# Patient Record
Sex: Male | Born: 1970
Health system: Southern US, Community
[De-identification: ages and names within clinical notes are randomized; demographics above are authoritative.]

## PROBLEM LIST (undated history)

## (undated) DIAGNOSIS — Z9289 Personal history of other medical treatment: Secondary | ICD-10-CM

## (undated) DIAGNOSIS — I10 Essential (primary) hypertension: Secondary | ICD-10-CM

## (undated) DIAGNOSIS — M6281 Muscle weakness (generalized): Secondary | ICD-10-CM

## (undated) DIAGNOSIS — I2510 Atherosclerotic heart disease of native coronary artery without angina pectoris: Secondary | ICD-10-CM

## (undated) DIAGNOSIS — H547 Unspecified visual loss: Secondary | ICD-10-CM

## (undated) DIAGNOSIS — E785 Hyperlipidemia, unspecified: Secondary | ICD-10-CM

## (undated) DIAGNOSIS — K760 Fatty (change of) liver, not elsewhere classified: Secondary | ICD-10-CM

## (undated) DIAGNOSIS — E669 Obesity, unspecified: Secondary | ICD-10-CM

## (undated) HISTORY — DX: Obesity, unspecified: E66.9

## (undated) HISTORY — DX: Essential (primary) hypertension: I10

## (undated) HISTORY — DX: Atherosclerotic heart disease of native coronary artery without angina pectoris: I25.10

## (undated) HISTORY — DX: Personal history of other medical treatment: Z92.89

## (undated) HISTORY — DX: Unspecified visual loss: H54.7

## (undated) HISTORY — PX: CARDIAC CATHETERIZATION: SHX172

## (undated) HISTORY — PX: TONSILLECTOMY: SUR1361

## (undated) HISTORY — DX: Muscle weakness (generalized): M62.81

## (undated) HISTORY — PX: WISDOM TOOTH EXTRACTION: SHX21

---

## 1995-11-25 DIAGNOSIS — M6281 Muscle weakness (generalized): Secondary | ICD-10-CM

## 1995-11-25 HISTORY — DX: Muscle weakness (generalized): M62.81

## 2004-11-24 HISTORY — PX: LAPAROSCOPIC GASTRIC BANDING: SHX1100

## 2005-11-24 HISTORY — PX: LAPAROSCOPIC GASTRIC BANDING: SHX1100

## 2012-10-24 DIAGNOSIS — I10 Essential (primary) hypertension: Secondary | ICD-10-CM

## 2012-10-24 HISTORY — DX: Essential (primary) hypertension: I10

## 2012-10-25 ENCOUNTER — Ambulatory Visit (INDEPENDENT_AMBULATORY_CARE_PROVIDER_SITE_OTHER): Payer: BC Managed Care – PPO | Admitting: Medical

## 2012-10-25 ENCOUNTER — Encounter: Payer: Self-pay | Admitting: Medical

## 2012-10-25 VITALS — BP 160/100 | HR 68 | Temp 98.4°F | Resp 12 | Ht 68.0 in | Wt 213.0 lb

## 2012-10-25 DIAGNOSIS — Z0000 Encounter for general adult medical examination without abnormal findings: Secondary | ICD-10-CM

## 2012-10-25 DIAGNOSIS — M25569 Pain in unspecified knee: Secondary | ICD-10-CM

## 2012-10-25 DIAGNOSIS — R030 Elevated blood-pressure reading, without diagnosis of hypertension: Secondary | ICD-10-CM

## 2012-10-25 DIAGNOSIS — Z23 Encounter for immunization: Secondary | ICD-10-CM

## 2012-10-25 DIAGNOSIS — E669 Obesity, unspecified: Secondary | ICD-10-CM

## 2012-10-25 DIAGNOSIS — M25562 Pain in left knee: Secondary | ICD-10-CM

## 2012-10-25 LAB — POCT URINALYSIS DIPSTICK
Blood, UA: NEGATIVE
Glucose, UA: NEGATIVE
Nitrite, UA: NEGATIVE
Urobilinogen, UA: NEGATIVE

## 2012-10-25 LAB — CBC WITH DIFFERENTIAL/PLATELET
Basophils Relative: 0 % (ref 0–1)
Eosinophils Absolute: 0.1 10*3/uL (ref 0.0–0.7)
Eosinophils Relative: 1 % (ref 0–5)
Hemoglobin: 14.3 g/dL (ref 13.0–17.0)
MCH: 28.5 pg (ref 26.0–34.0)
MCHC: 34.6 g/dL (ref 30.0–36.0)
Monocytes Absolute: 0.7 10*3/uL (ref 0.1–1.0)
Monocytes Relative: 7 % (ref 3–12)
Neutrophils Relative %: 60 % (ref 43–77)

## 2012-10-25 MED ORDER — LISINOPRIL 10 MG PO TABS: 10.0000 mg | ORAL_TABLET | Freq: Every day | ORAL | Status: DC

## 2012-10-25 NOTE — Progress Notes (Signed)
Subjective:   HPI  Hunter Gomez is a 41 y.o. male who presents for a complete physical.  New patient today.  No recent primary care.   Preventative care: Last ophthalmology visit:N/A Last dental visit:2 YEARS AGO Last colonoscopy:N/A Last prostate exam: N/A Last EKG:N/A Last labs:2008  Prior vaccinations: TD or Tdap:N/A Influenza:NO, declines Pneumococcal:N/A Shingles/Zostavax:N/A  Concerns: Occasional sinus problems winter and spring.  Uses Afrin sometimes OTC.  No current symptoms.  Recent left knee pains intermittent, worse with running, but no problems with elliptical.  Has increase pain going down stairs.    BP has been elevated at times.  Parents and twin brother have HTN.  He reports nurse recently checked his BP some, and he has been seeing 150/90s.  Reviewed their medical, surgical, family, social, medication, and allergy history and updated chart as appropriate.   Past Medical History  Diagnosis Date  . Vision problem     prior use of glasses, not currently  . Muscle weakness 1997    prior seizure like activity, but none since; prior neurology eval, prior normal EEG 1997  . Twin birth     has identical twin  . Hypertension 10/2012    Past Surgical History  Procedure Date  . Tonsillectomy   . Wisdom tooth extraction   . Laparoscopic gastric banding 2007    initial procedure Dr. Robb Matar in [place], adjustments in [place], [place]    Family History  Problem Relation Age of Onset  . Diabetes Mother   . Hypertension Mother   . Hyperlipidemia Mother   . Heart disease Mother 17    CABG  . Peripheral vascular disease Mother   . Hypertension Father   . Hyperlipidemia Father   . Heart disease Father 28    CABG  . Stroke Father   . Hypertension Brother   . Hyperlipidemia Brother   . Cancer Maternal Grandfather     stomach  . Cancer Paternal Grandmother     History   Social History  . Marital Status: Married    Spouse Name: N/A    Number of  Children: N/A  . Years of Education: N/A   Occupational History  . Not on file.   Social History Main Topics  . Smoking status: Never Smoker   . Smokeless tobacco: Not on file  . Alcohol Use: No  . Drug Use: No  . Sexually Active: Not on file   Other Topics Concern  . Not on file   Social History Narrative   Married, 2 sons, limited exercise, [employer] technician    Current Outpatient Prescriptions on File Prior to Visit  Medication Sig Dispense Refill  . lisinopril (PRINIVIL,ZESTRIL) 10 MG tablet Take 1 tablet (10 mg total) by mouth daily.  30 tablet  3    No Known Allergies   Review of Systems Constitutional: -fever, -chills, -sweats, -unexpected weight change, -decreased appetite, -fatigue Allergy: -sneezing, -itching, -congestion Dermatology: -changing moles, --rash, -lumps ENT: -runny nose, -ear pain, -sore throat, -hoarseness, -sinus pain, -teeth pain, - ringing in ears, -hearing loss, -nosebleeds Cardiology: -chest pain, -palpitations, -swelling, -difficulty breathing when lying flat, -waking up short of breath Respiratory: -cough, -shortness of breath, -difficulty breathing with exercise or exertion, -wheezing, -coughing up blood Gastroenterology: -abdominal pain, -nausea, -vomiting, -diarrhea, -constipation, -blood in stool, -changes in bowel movement, -difficulty swallowing or eating Hematology: -bleeding, -bruising  Musculoskeletal: -joint aches, -muscle aches, -joint swelling, -back pain, -neck pain, -cramping, -changes in gait Ophthalmology: denies vision changes, eye redness, itching,  discharge Urology: -burning with urination, -difficulty urinating, -blood in urine, -urinary frequency, -urgency, -incontinence Neurology: -headache, -weakness, -tingling, -numbness, -memory loss, -falls, -dizziness Psychology: -depressed mood, -agitation, -sleep problems     Objective:   Physical Exam  Reviewed nursing notes and vitals.  General appearance:  alert, no distress, WD/WN, white male Skin:scattered benign appearance macules, left lower chest with small horizontal surgical scar, similar LUQ abdominal region with small horizontal surgical scar from lap band HEENT: normocephalic, conjunctiva/corneas normal, sclerae anicteric, PERRLA, EOMi, nares patent, no discharge or erythema, pharynx normal Oral cavity: MMM, tongue normal, teeth in good repair Neck: supple, no lymphadenopathy, no thyromegaly, no masses, normal ROM, no bruits Chest: non tender, normal shape and expansion Heart: RRR, normal S1, S2, no murmurs Lungs: CTA bilaterally, no wheezes, rhonchi, or rales Abdomen: +bs, soft, non tender, non distended, no masses, no hepatomegaly, no splenomegaly, no bruits Back: non tender, normal ROM, no scoliosis Musculoskeletal: upper extremities non tender, no obvious deformity, normal ROM throughout, lower extremities non tender, no obvious deformity, normal ROM throughout Extremities: no edema, no cyanosis, no clubbing Pulses: 2+ symmetric, upper and lower extremities, normal cap refill Neurological: alert, oriented x 3, CN2-12 intact, strength normal upper extremities and lower extremities, sensation normal throughout, DTRs 2+ throughout, no cerebellar signs, gait normal Psychiatric: normal affect, behavior normal, pleasant  GU: normal male external genitalia, nontender, no masses, no hernia, no lymphadenopathy Rectal: anus normal, prostate WNL   Adult ECG Report  Indication: new diagnosis HTN, physical  Rate: 64 bpm  Rhythm: normal sinus rhythm  QRS Axis: 50 degrees  PR Interval:  QRS Duration: 98ms  QTc:  Conduction Disturbances: none  Other Abnormalities: none , nonspecific T wave inversion V1  Patient's cardiac risk factors are: hypertension, male gender and obesity (BMI >= 30 kg/m2).  EKG comparison: none  Narrative Interpretation: normal EKG   Assessment and Plan :    Encounter Diagnoses  Name Primary?  .  Routine general medical examination at a health care facility Yes  . Elevated blood pressure   . Obesity   . Knee pain, left   . Need for Tdap vaccination      Physical exam - discussed healthy lifestyle, diet, exercise, preventative care, vaccinations, and addressed their concerns.   Advised yearly dentist and ophthalmology f/u.  cautioned on Afrin OTC use.    Elevated BP - based on today's readings, prior recent readings reported, family and twin brother history of HTN, will begin Lisinopril 10mg  daily.   Advised he work on lifestyle changes as well. Recheck 85mo.  Obesity - discussed need for increased in exercise, weight loss, better diet control.  Knee pain - possible patellofemoral syndrome or possible chondromalacia.  Work on home exercises as demonstrated, can try glucosamine or fish oil oral OTC, gradual increase in activity slowly.  F/u 85mo.  Tdap vaccine, VIS and counseling given.   Follow-up pending labs

## 2012-10-26 LAB — COMPREHENSIVE METABOLIC PANEL
AST: 20 U/L (ref 0–37)
Albumin: 4.8 g/dL (ref 3.5–5.2)
Alkaline Phosphatase: 83 U/L (ref 39–117)
BUN: 12 mg/dL (ref 6–23)
Potassium: 4.2 mEq/L (ref 3.5–5.3)
Sodium: 142 mEq/L (ref 135–145)

## 2012-10-26 LAB — TSH: TSH: 3.792 u[IU]/mL (ref 0.350–4.500)

## 2012-10-26 LAB — LIPID PANEL
LDL Cholesterol: 130 mg/dL — ABNORMAL HIGH (ref 0–99)
Total CHOL/HDL Ratio: 4.9 Ratio
VLDL: 31 mg/dL (ref 0–40)

## 2012-10-27 NOTE — Progress Notes (Signed)
Quick Note:  CALLED AND LEFT MESSAGE WORD FOR WORD ON PT MACHINE ALSO FOR HIM TO CALL AND MAKE APT TO FOLLOW UP ON B/P ______

## 2013-03-16 ENCOUNTER — Ambulatory Visit (INDEPENDENT_AMBULATORY_CARE_PROVIDER_SITE_OTHER): Payer: BC Managed Care – PPO | Admitting: Medical

## 2013-03-16 ENCOUNTER — Encounter: Payer: Self-pay | Admitting: Medical

## 2013-03-16 VITALS — BP 160/98 | HR 60 | Temp 98.2°F | Resp 16 | Wt 220.0 lb

## 2013-03-16 DIAGNOSIS — Z9884 Bariatric surgery status: Secondary | ICD-10-CM

## 2013-03-16 DIAGNOSIS — G4710 Hypersomnia, unspecified: Secondary | ICD-10-CM

## 2013-03-16 DIAGNOSIS — I10 Essential (primary) hypertension: Secondary | ICD-10-CM

## 2013-03-16 DIAGNOSIS — R400 Somnolence: Secondary | ICD-10-CM

## 2013-03-16 DIAGNOSIS — R0683 Snoring: Secondary | ICD-10-CM

## 2013-03-16 DIAGNOSIS — E669 Obesity, unspecified: Secondary | ICD-10-CM | POA: Insufficient documentation

## 2013-03-16 DIAGNOSIS — R0989 Other specified symptoms and signs involving the circulatory and respiratory systems: Secondary | ICD-10-CM

## 2013-03-16 MED ORDER — LISINOPRIL-HYDROCHLOROTHIAZIDE 20-25 MG PO TABS: 1.0000 | ORAL_TABLET | Freq: Every day | ORAL | Status: DC

## 2013-03-16 NOTE — Progress Notes (Signed)
Subjective:  Hunter Gomez is a 42 y.o. male who presents for recheck on BP.  Been consistently taking his Lisinopril since last visit but most readings have been 150s/high 80s.  Only once 130s/85.   His brother has HTN (twin brother) takes Benicar HCT 40/25mg .  Father has hx/o HTN and sleep apnea.   He denies snoring.  Wife hasn't mentioned snoring or apnea.  Feels rested when he awakes.  Feels sleepy all the time.  Gets about 8 hours of sleep nightly.  Has lap band surgery.  He is trying to eat healthy but not exercising.  No other aggravating or relieving factors.     No other c/o.  The following portions of the patient's history were reviewed and updated as appropriate: allergies, current medications, past family history, past medical history, past social history, past surgical history and problem list.  ROS Otherwise as in subjective above  Objective: Physical Exam  Vital signs reviewed  General appearance: alert, no distress, WD/WN Oral cavity: MMM, no lesions, no enlarged tonsils Neck: supple, no lymphadenopathy, no thyromegaly, no masses, 17.5" diamter Heart: RRR, normal S1, S2, no murmurs Lungs: CTA bilaterally, no wheezes, rhonchi, or rales Pulses: 2+ radial pulses, 2+ pedal pulses, normal cap refill Ext: no edema   Assessment: Encounter Diagnoses  Name Primary?  . Essential hypertension, benign Yes  . Obesity, unspecified   . Snoring   . Daytime somnolence   . LAP-BAND surgery status      Plan: Stop lisionpril 10mg .  Begin Lisinopril HCT 20/25mg  daily.  Discussed risks/benefits.   Begin exercising, c/t healthy diet, try and lose weight.  We will set him up for in home sleep study.   Follow up: 57mo.

## 2013-07-09 ENCOUNTER — Emergency Department (HOSPITAL_COMMUNITY)
Admission: EM | Admit: 2013-07-09 | Discharge: 2013-07-09 | Disposition: A | Discharge status: Home / Self Care | Payer: BC Managed Care – PPO | Source: Home / Self Care | Attending: Emergency Medicine | Admitting: Emergency Medicine

## 2013-07-09 ENCOUNTER — Encounter (HOSPITAL_COMMUNITY): Payer: Self-pay | Admitting: *Deleted

## 2013-07-09 DIAGNOSIS — T148XXA Other injury of unspecified body region, initial encounter: Secondary | ICD-10-CM

## 2013-07-09 DIAGNOSIS — IMO0002 Reserved for concepts with insufficient information to code with codable children: Secondary | ICD-10-CM

## 2013-07-09 NOTE — ED Notes (Signed)
Patient states he cut his finger this morning and band aid could not stop the bleeding.

## 2013-07-09 NOTE — ED Provider Notes (Signed)
Chief Complaint:   Chief Complaint  Patient presents with  . Extremity Laceration    History of Present Illness:   Hunter Gomez is a 42 year old male lacerated the tip of his right index finger on a sharp pain can lid this afternoon, shortly before coming here. His last tetanus vaccine was last December. He's had trouble controlling the bleeding. Denies numbness or tingling.  Review of Systems:  Other than noted above, the patient denies any of the following symptoms: Systemic:  No fever or chills. Musculoskeletal:  No joint pain or decreased range of motion. Neuro:  No numbness, tingling, or weakness.  PMFSH:  Past medical history, family history, social history, meds, and allergies were reviewed. He has high blood pressure and takes lisinopril/HCTZ for that.  Physical Exam:   Vital signs:  BP 126/84  Pulse 82  Temp(Src) 97.9 F (36.6 C) (Oral)  Resp 16  SpO2 100% Ext:  There is a 1.5 cm laceration on the tip of the right index finger.  All other joints had a full ROM without pain.  Pulses were full.  Good capillary refill in all digits.  No edema. Neurological:  Alert and oriented.  No muscle weakness.  Sensation was intact to light touch.   Procedure: Verbal informed consent was obtained.  The patient was informed of the risks and benefits of the procedure and understands and accepts.  Identity of the patient was verified verbally and by wristband.   The laceration area described above was prepped with Betadine and saline  and anesthetized with a digital block with 5 mL of 2% Xylocaine without epinephrine that had been buffered with 0.5 mL of sodium bicarbonate.  The wound was then closed as follows:  Wound edges were approximated with 4 5-0 nylon sutures.  There were no immediate complications, and the patient tolerated the procedure well. The laceration was then cleansed, Bacitracin ointment was applied and a clean, dry pressure dressing was put on.   Assessment:  The encounter  diagnosis was Laceration.  Plan:   1.  The following meds were prescribed:   Discharge Medication List as of 07/09/2013  1:50 PM     2.  The patient was instructed in wound care and pain control, and handouts were given. 3.  The patient was told to return in 14 days for suture removal or wound recheck or sooner if any sign of infection.     Reuben Likes, MD 07/09/13 2031

## 2013-08-01 ENCOUNTER — Ambulatory Visit (INDEPENDENT_AMBULATORY_CARE_PROVIDER_SITE_OTHER): Payer: BC Managed Care – PPO | Admitting: Medical

## 2013-08-01 ENCOUNTER — Telehealth: Payer: Self-pay | Admitting: Internal Medicine

## 2013-08-01 ENCOUNTER — Encounter: Payer: Self-pay | Admitting: Medical

## 2013-08-01 VITALS — BP 122/80 | HR 60 | Temp 97.9°F | Resp 16 | Wt 224.0 lb

## 2013-08-01 DIAGNOSIS — R209 Unspecified disturbances of skin sensation: Secondary | ICD-10-CM

## 2013-08-01 DIAGNOSIS — S6991XD Unspecified injury of right wrist, hand and finger(s), subsequent encounter: Secondary | ICD-10-CM

## 2013-08-01 DIAGNOSIS — I10 Essential (primary) hypertension: Secondary | ICD-10-CM

## 2013-08-01 DIAGNOSIS — Z5189 Encounter for other specified aftercare: Secondary | ICD-10-CM

## 2013-08-01 DIAGNOSIS — R202 Paresthesia of skin: Secondary | ICD-10-CM

## 2013-08-01 MED ORDER — LISINOPRIL-HYDROCHLOROTHIAZIDE 20-25 MG PO TABS: 1.0000 | ORAL_TABLET | Freq: Every day | ORAL | Status: DC

## 2013-08-01 NOTE — Progress Notes (Signed)
Subjective: Here for follow-up of hypertension. He is exercising some, and heis adherent to a low-salt diet.  Blood pressure is well controlled at home.  Cardiac symptoms: none. Patient denies: chest pain, chest pressure/discomfort, lower extremity edema and palpitations. Cardiovascular risk factors: hypertension, male gender and obesity (BMI >= 30 kg/m2). Use of agents associated with hypertension: none. History of target organ damage: none.  He was seen in the ED 3 wk ago for laceration to tip of right index finger.  Was suppose to have sutures out 2 wk ago, but took them out on his own but cutting and pulling at the knot this weekend as the skin was starting to grow into the sutures.  He denies redness, drainage, warmth.  He notes the last few weeks been having some tingling and somewhat of a numbness in the left hand at times.  This sometimes includes the forearm.  Denies neck pain, jolting pain down arm from shoulder or neck.  No swelling.  Denies recent injury, trauma.  He is on the computer often at work.    Objective:   Physical Exam  Filed Vitals:   08/01/13 1556  BP: 122/80  Pulse: 60  Temp: 97.9 F (36.6 C)  Resp: 16    General appearance: alert, no distress, WD/WN Neck: supple, nontender, normal ROM, no lymphadenopathy, no thyromegaly, no masses Heart: RRR, normal S1, S2, no murmurs Lungs: CTA bilaterally, no wheezes, rhonchi, or rales Musculoskeletal: arms, hands, shoulders and elbows nontender, no swelling, no obvious deformity Extremities: no edema, no cyanosis, no clubbing Pulses: 2+ symmetric, upper and lower extremities, normal cap refill Neurological: UE normal strength, sensation and DTRs, negative phalens and tinels  Assessment and Plan :    Encounter Diagnoses  Name Primary?  . Essential hypertension, benign Yes  . Paresthesia of hand   . Finger injury, right, subsequent encounter    HTN - controlled on current medication.  recommended increased exercise,  c/t healthy diet, salt avoidance. Paresthesia - likely combination of carpal tunnel and ulnar nerve syndrome, mild.  Begin OTC night time reinforced wrist splint on the left, consider ergonomic keyboard for computer at work,NSAID prn. Finger pain - no obvious signs of infection, healing fine.   Advised not to wait so long next time to have sutures removed. Follow-up prn, within 6-9 mo on hypertension.

## 2013-08-01 NOTE — Telephone Encounter (Signed)
Med was printed instead of sent in

## 2013-09-29 ENCOUNTER — Other Ambulatory Visit: Payer: Self-pay

## 2013-10-11 DIAGNOSIS — Z0279 Encounter for issue of other medical certificate: Secondary | ICD-10-CM

## 2013-10-12 ENCOUNTER — Telehealth: Payer: Self-pay | Admitting: Internal Medicine

## 2013-10-12 NOTE — Telephone Encounter (Signed)
Faxed over medical records to [facility] @ [phone] on 10/11/13. 

## 2013-12-22 ENCOUNTER — Ambulatory Visit (INDEPENDENT_AMBULATORY_CARE_PROVIDER_SITE_OTHER): Payer: BC Managed Care – PPO | Admitting: Family Medicine

## 2013-12-22 VITALS — BP 132/90 | HR 113 | Temp 101.1°F | Wt 206.0 lb

## 2013-12-22 DIAGNOSIS — J111 Influenza due to unidentified influenza virus with other respiratory manifestations: Secondary | ICD-10-CM

## 2013-12-22 LAB — POCT INFLUENZA A/B

## 2013-12-22 NOTE — Patient Instructions (Addendum)
Take 2 Aleve 2 or 3 times a day for the fever aches and pains. It is reasonable stay home until fever goes awayInfluenza A (H1N1) H1N1 formerly called "swine flu" is a new influenza virus causing sickness in people. The H1N1 virus is different from seasonal influenza viruses. However, the H1N1 symptoms are similar to seasonal influenza and it is spread from person to person. You may be at higher risk for serious problems if you have underlying serious medical conditions. The CDC and the [employer] Health Organization are following reported cases around the world. CAUSES   The flu is thought to spread mainly person-to-person through coughing or sneezing of infected people.  A person may become infected by touching something with the virus on it and then touching their mouth or nose. SYMPTOMS   Fever.  Headache.  Tiredness.  Cough.  Sore throat.  Runny or stuffy nose.  Body aches.  Diarrhea and vomiting These symptoms are referred to as "flu-like symptoms." A lot of different illnesses, including the common cold, may have similar symptoms. DIAGNOSIS   There are tests that can tell if you have the H1N1 virus.  Confirmed cases of H1N1 will be reported to the state or local health department.  A doctor's exam may be needed to tell whether you have an infection that is a complication of the flu. HOME CARE INSTRUCTIONS   Stay informed. Visit the [facility] website for current recommendations. Visit [employer].tnwww.cdc.gov/H1N1flu/. You may also call 1-800-CDC-INFO (229-166-21881-608-328-4273).  Get help early if you develop any of the above symptoms.  If you are at high risk from complications of the flu, talk to your caregiver as soon as you develop flu-like symptoms. Those at higher risk for complications include:  People 65 years or older.  People with chronic medical conditions.  Pregnant women.  Young children.  Your caregiver may recommend antiviral medicine to help treat the flu.  If you get the flu,  get plenty of rest, drink enough water and fluids to keep your urine clear or pale yellow, and avoid using alcohol or tobacco.  You may take over-the-counter medicine to relieve the symptoms of the flu if your caregiver approves. (Never give aspirin to children or teenagers who have flu-like symptoms, particularly fever). TREATMENT  If you do get sick, antiviral drugs are available. These drugs can make your illness milder and make you feel better faster. Treatment should start soon after illness starts. It is only effective if taken within the first day of becoming ill. Only your caregiver can prescribe antiviral medication.  PREVENTION   Cover your nose and mouth with a tissue or your arm when you cough or sneeze. Throw the tissue away.  Wash your hands often with soap and warm water, especially after you cough or sneeze. Alcohol-based cleaners are also effective against germs.  Avoid touching your eyes, nose or mouth. This is one way germs spread.  Try to avoid contact with sick people. Follow public health advice regarding school closures. Avoid crowds.  Stay home if you get sick. Limit contact with others to keep from infecting them. People infected with the H1N1 virus may be able to infect others anywhere from 1 day before feeling sick to 5-7 days after getting flu symptoms.  An H1N1 vaccine is available to help protect against the virus. In addition to the H1N1 vaccine, you will need to be vaccinated for seasonal influenza. The H1N1 and seasonal vaccines may be given on the same day. The CDC especially recommends  the H1N1 vaccine for:  Pregnant women.  People who live with or care for children younger than 18 months of age.  Health care and emergency services personnel.  Persons between the ages of 11 months through 41 years of age.  People from ages 32 through 57 years who are at higher risk for H1N1 because of chronic health disorders or immune system problems. FACEMASKS In  community and home settings, the use of facemasks and N95 respirators are not normally recommended. In certain circumstances, a facemask or N95 respirator may be used for persons at increased risk of severe illness from influenza. Your caregiver can give additional recommendations for facemask use. IN CHILDREN, EMERGENCY WARNING SIGNS THAT NEED URGENT MEDICAL CARE:  Fast breathing or trouble breathing.  Bluish skin color.  Not drinking enough fluids.  Not waking up or not interacting normally.  Being so fussy that the child does not want to be held.  Your child has an oral temperature above 102 F (38.9 C), not controlled by medicine.  Your baby is older than 3 months with a rectal temperature of 102 F (38.9 C) or higher.  Your baby is 52 months old or younger with a rectal temperature of 100.4 F (38 C) or higher.  Flu-like symptoms improve but then return with fever and worse cough. IN ADULTS, EMERGENCY WARNING SIGNS THAT NEED URGENT MEDICAL CARE:  Difficulty breathing or shortness of breath.  Pain or pressure in the chest or abdomen.  Sudden dizziness.  Confusion.  Severe or persistent vomiting.  Bluish color.  You have a oral temperature above 102 F (38.9 C), not controlled by medicine.  Flu-like symptoms improve but return with fever and worse cough. SEEK IMMEDIATE MEDICAL CARE IF:  You or someone you know is experiencing any of the above symptoms. When you arrive at the emergency center, report that you think you have the flu. You may be asked to wear a mask and/or sit in a secluded area to protect others from getting sick. MAKE SURE YOU:   Understand these instructions.  Will watch your condition.  Will get help right away if you are not doing well or get worse. Some of this information courtesy of the CDC.  Document Released: 04/28/2008 Document Revised: 02/02/2012 Document Reviewed: 04/28/2008 [facility] Patient Information 2014 [place], [place].

## 2013-12-22 NOTE — Progress Notes (Signed)
   Subjective:    Patient ID: Hunter Gomez, male    DOB: 03/07/1971, 43 y.o.   MRN: [id number]  HPI Days ago he had the onset of malaise followed the next day by fever and chills with a temperature of 102 to 104. His son was diagnosed with the flu on Monday of this week. That was positive for influenza B   Review of Systems     Objective:   Physical Exam alert and in no distress. Tympanic membranes and canals are normal. Throat is clear. Tonsils are normal. Neck is supple without adenopathy or thyromegaly. Cardiac exam shows a regular sinus rhythm without murmurs or gallops. Lungs are clear to auscultation. Flu test was negative       Assessment & Plan:  Influenza - Plan: Influenza A/B  although the test was negative I think he certainly qualifies. Discussed the use of Tamiflu and he has not interested. Did recommend NSAID of choice and followup as needed.

## 2014-08-08 ENCOUNTER — Other Ambulatory Visit: Payer: Self-pay | Admitting: Medical

## 2014-08-08 NOTE — Telephone Encounter (Signed)
PATIENT NEEDS AN OFFICE VISIT AS SOON AS POSSIBLE BEFORE HIS MEDICATION RUNS OUT. LAST OFFICE VISIT WAS 07/2013

## 2014-10-23 ENCOUNTER — Other Ambulatory Visit: Payer: Self-pay | Admitting: Medical

## 2014-11-24 DIAGNOSIS — Z951 Presence of aortocoronary bypass graft: Secondary | ICD-10-CM

## 2014-11-24 HISTORY — DX: Presence of aortocoronary bypass graft: Z95.1

## 2014-12-04 ENCOUNTER — Encounter: Payer: Self-pay | Admitting: Medical

## 2014-12-04 ENCOUNTER — Ambulatory Visit (INDEPENDENT_AMBULATORY_CARE_PROVIDER_SITE_OTHER): Payer: BLUE CROSS/BLUE SHIELD | Admitting: Medical

## 2014-12-04 VITALS — BP 140/80 | HR 80 | Temp 98.1°F | Resp 16 | Wt 218.0 lb

## 2014-12-04 DIAGNOSIS — I10 Essential (primary) hypertension: Secondary | ICD-10-CM

## 2014-12-04 DIAGNOSIS — Z9884 Bariatric surgery status: Secondary | ICD-10-CM

## 2014-12-04 DIAGNOSIS — E669 Obesity, unspecified: Secondary | ICD-10-CM

## 2014-12-04 DIAGNOSIS — K2970 Gastritis, unspecified, without bleeding: Secondary | ICD-10-CM

## 2014-12-04 LAB — LIPID PANEL
Cholesterol: 209 mg/dL — ABNORMAL HIGH (ref 0–200)
HDL: 31 mg/dL — AB (ref >39)
LDL Cholesterol: 126 mg/dL — ABNORMAL HIGH (ref 0–99)
Total CHOL/HDL Ratio: 6.7 Ratio
Triglycerides: 262 mg/dL — ABNORMAL HIGH (ref <150)
VLDL: 52 mg/dL — ABNORMAL HIGH (ref 0–40)

## 2014-12-04 LAB — CBC
HEMATOCRIT: 44.2 % (ref 39.0–52.0)
HEMOGLOBIN: 15.1 g/dL (ref 13.0–17.0)
MCH: 28 pg (ref 26.0–34.0)
MCHC: 34.2 g/dL (ref 30.0–36.0)
MCV: 82 fL (ref 78.0–100.0)
MPV: 8.7 fL (ref 8.6–12.4)
Platelets: 450 10*3/uL — ABNORMAL HIGH (ref 150–400)
RBC: 5.39 MIL/uL (ref 4.22–5.81)
RDW: 13.8 % (ref 11.5–15.5)
WBC: 11.4 10*3/uL — ABNORMAL HIGH (ref 4.0–10.5)

## 2014-12-04 LAB — COMPREHENSIVE METABOLIC PANEL
ALBUMIN: 4.6 g/dL (ref 3.5–5.2)
ALK PHOS: 120 U/L — AB (ref 39–117)
ALT: 20 U/L (ref 0–53)
AST: 16 U/L (ref 0–37)
BUN: 10 mg/dL (ref 6–23)
CHLORIDE: 99 meq/L (ref 96–112)
CO2: 26 meq/L (ref 19–32)
Calcium: 9.6 mg/dL (ref 8.4–10.5)
Creat: 0.8 mg/dL (ref 0.50–1.35)
GLUCOSE: 87 mg/dL (ref 70–99)
POTASSIUM: 4.3 meq/L (ref 3.5–5.3)
Sodium: 136 mEq/L (ref 135–145)
TOTAL PROTEIN: 7.4 g/dL (ref 6.0–8.3)
Total Bilirubin: 0.6 mg/dL (ref 0.2–1.2)

## 2014-12-04 LAB — TSH: TSH: 2.366 u[IU]/mL (ref 0.350–4.500)

## 2014-12-04 MED ORDER — LISINOPRIL-HYDROCHLOROTHIAZIDE 20-25 MG PO TABS: 1.0000 | ORAL_TABLET | Freq: Every day | ORAL | Status: DC

## 2014-12-04 MED ORDER — OMEPRAZOLE 40 MG PO CPDR: 40.0000 mg | DELAYED_RELEASE_CAPSULE | Freq: Every day | ORAL | Status: DC

## 2014-12-04 NOTE — Progress Notes (Signed)
Subjective: Here for f/u on hypertension.   Last f/u here for this was over a year ago.  Somehow he got refills that lasted til now.   Ran out of BP medication recently.   Up until he ran out was taking the medication regularly.  BPs was running ok.  Exercise - some.  [place]  Joined a gym.   Having some problems with lap band adjusted.  Can eat reasonable food portions.   Has cough on most nights, no daytime cough.   Can't lie flat of late due aspiration.  Has times where he aspirates in the night lying down.   Planning to see surgeon here soon.    Had the surgery 7 years ago.   In general diet is "normal."  No added salt.   No other aggravating or relieving factors. No other complaint.  Nonsmoker.  No alcohol.    Objective: Filed Vitals:   12/04/14 1024  BP: 140/80  Pulse: 80  Temp: 98.1 F (36.7 C)  Resp: 16   General appearance: alert, no distress, WD/WN Oral cavity: MMM, no lesions Neck: supple, no lymphadenopathy, no thyromegaly, no masses Heart: RRR, normal S1, S2, no murmurs Lungs: CTA bilaterally, no wheezes, rhonchi, or rales Abdomen: +bs, soft, LUQ with hard lump c/w gastric band,otherwise non tender, non distended, no masses, no hepatomegaly, no splenomegaly Pulses: 2+ symmetric, upper and lower extremities, normal cap refill Ext: no edema   Assessment: Encounter Diagnoses  Name Primary?  . Essential hypertension Yes  . History of gastric bypass   . Gastritis   . Obesity      Plan: HTN - continue medication, labs today Hx/o bypass - f/u as planned unless he sees complete resolution of symptoms with omeprazole Gastritis - begin omeprazole daily x 2 wk, reduce diet triggers of GERD.  Likely symptoms are related to diet and lap band, but trial of omeprazole Obesity - work on weight loss efforts F/u pending labs, 2wk on GERD.

## 2014-12-15 ENCOUNTER — Encounter (HOSPITAL_COMMUNITY): Payer: Self-pay | Admitting: Adult Health

## 2014-12-15 ENCOUNTER — Inpatient Hospital Stay (HOSPITAL_COMMUNITY)
Admission: EM | Admit: 2014-12-15 | Discharge: 2014-12-23 | DRG: 234 | Disposition: A | Discharge status: Home / Self Care | Payer: BLUE CROSS/BLUE SHIELD | Attending: Cardiothoracic Surgery | Admitting: Cardiothoracic Surgery

## 2014-12-15 ENCOUNTER — Emergency Department (HOSPITAL_COMMUNITY): Payer: BLUE CROSS/BLUE SHIELD

## 2014-12-15 DIAGNOSIS — R079 Chest pain, unspecified: Secondary | ICD-10-CM

## 2014-12-15 DIAGNOSIS — Z9884 Bariatric surgery status: Secondary | ICD-10-CM | POA: Diagnosis not present

## 2014-12-15 DIAGNOSIS — I2510 Atherosclerotic heart disease of native coronary artery without angina pectoris: Secondary | ICD-10-CM | POA: Diagnosis present

## 2014-12-15 DIAGNOSIS — I214 Non-ST elevation (NSTEMI) myocardial infarction: Secondary | ICD-10-CM | POA: Diagnosis present

## 2014-12-15 DIAGNOSIS — Z79899 Other long term (current) drug therapy: Secondary | ICD-10-CM | POA: Diagnosis not present

## 2014-12-15 DIAGNOSIS — D62 Acute posthemorrhagic anemia: Secondary | ICD-10-CM | POA: Diagnosis not present

## 2014-12-15 DIAGNOSIS — K219 Gastro-esophageal reflux disease without esophagitis: Secondary | ICD-10-CM | POA: Diagnosis present

## 2014-12-15 DIAGNOSIS — I252 Old myocardial infarction: Secondary | ICD-10-CM | POA: Diagnosis present

## 2014-12-15 DIAGNOSIS — J939 Pneumothorax, unspecified: Secondary | ICD-10-CM

## 2014-12-15 DIAGNOSIS — Z8249 Family history of ischemic heart disease and other diseases of the circulatory system: Secondary | ICD-10-CM | POA: Diagnosis not present

## 2014-12-15 DIAGNOSIS — E785 Hyperlipidemia, unspecified: Secondary | ICD-10-CM | POA: Diagnosis present

## 2014-12-15 DIAGNOSIS — R0789 Other chest pain: Secondary | ICD-10-CM

## 2014-12-15 DIAGNOSIS — E669 Obesity, unspecified: Secondary | ICD-10-CM | POA: Diagnosis present

## 2014-12-15 DIAGNOSIS — R000 Tachycardia, unspecified: Secondary | ICD-10-CM | POA: Diagnosis not present

## 2014-12-15 DIAGNOSIS — Z6833 Body mass index (BMI) 33.0-33.9, adult: Secondary | ICD-10-CM

## 2014-12-15 DIAGNOSIS — I10 Essential (primary) hypertension: Secondary | ICD-10-CM | POA: Diagnosis present

## 2014-12-15 DIAGNOSIS — Z951 Presence of aortocoronary bypass graft: Secondary | ICD-10-CM

## 2014-12-15 DIAGNOSIS — E8770 Fluid overload, unspecified: Secondary | ICD-10-CM | POA: Diagnosis not present

## 2014-12-15 HISTORY — DX: Hyperlipidemia, unspecified: E78.5

## 2014-12-15 LAB — BASIC METABOLIC PANEL
Anion gap: 9 (ref 5–15)
BUN: 10 mg/dL (ref 6–23)
CALCIUM: 9.3 mg/dL (ref 8.4–10.5)
CHLORIDE: 102 mmol/L (ref 96–112)
CO2: 27 mmol/L (ref 19–32)
CREATININE: 0.74 mg/dL (ref 0.50–1.35)
GFR calc Af Amer: >90 mL/min (ref >90)
GFR calc non Af Amer: >90 mL/min (ref >90)
Glucose, Bld: 109 mg/dL — ABNORMAL HIGH (ref 70–99)
Potassium: 3.8 mmol/L (ref 3.5–5.1)
SODIUM: 138 mmol/L (ref 135–145)

## 2014-12-15 LAB — TROPONIN I: TROPONIN I: 0.13 ng/mL — AB (ref <0.031)

## 2014-12-15 LAB — I-STAT TROPONIN, ED: Troponin i, poc: 0.07 ng/mL (ref 0.00–0.08)

## 2014-12-15 LAB — CBC
HCT: 43.4 % (ref 39.0–52.0)
Hemoglobin: 14.5 g/dL (ref 13.0–17.0)
MCH: 28.4 pg (ref 26.0–34.0)
MCHC: 33.4 g/dL (ref 30.0–36.0)
MCV: 84.9 fL (ref 78.0–100.0)
Platelets: 381 10*3/uL (ref 150–400)
RBC: 5.11 MIL/uL (ref 4.22–5.81)
RDW: 12.8 % (ref 11.5–15.5)
WBC: 11.9 10*3/uL — ABNORMAL HIGH (ref 4.0–10.5)

## 2014-12-15 LAB — HEPATIC FUNCTION PANEL
ALT: 30 U/L (ref 0–53)
AST: 27 U/L (ref 0–37)
Albumin: 4.5 g/dL (ref 3.5–5.2)
Alkaline Phosphatase: 88 U/L (ref 39–117)
Bilirubin, Direct: 0.1 mg/dL (ref 0.0–0.5)
Indirect Bilirubin: 0.3 mg/dL (ref 0.3–0.9)
Total Bilirubin: 0.4 mg/dL (ref 0.3–1.2)
Total Protein: 7.2 g/dL (ref 6.0–8.3)

## 2014-12-15 MED ORDER — ASPIRIN 325 MG PO TABS
325.0000 mg | ORAL_TABLET | ORAL | Status: AC
  Administered 2014-12-15: 325 mg via ORAL
  Filled 2014-12-15: qty 1

## 2014-12-15 MED ORDER — NITROGLYCERIN 0.4 MG SL SUBL
SUBLINGUAL_TABLET | SUBLINGUAL | Status: AC
  Filled 2014-12-15: qty 3

## 2014-12-15 MED ORDER — NITROGLYCERIN 0.3 MG SL SUBL
0.3000 mg | SUBLINGUAL_TABLET | Freq: Once | SUBLINGUAL | Status: DC
  Filled 2014-12-15: qty 100

## 2014-12-15 MED ORDER — HEPARIN (PORCINE) IN NACL 100-0.45 UNIT/ML-% IJ SOLN
1200.0000 [IU]/h | INTRAMUSCULAR | Status: DC
  Administered 2014-12-15: 1200 [IU]/h via INTRAVENOUS
  Filled 2014-12-15 (×2): qty 250

## 2014-12-15 MED ORDER — HEPARIN BOLUS VIA INFUSION
4000.0000 [IU] | Freq: Once | INTRAVENOUS | Status: AC
  Administered 2014-12-15: 4000 [IU] via INTRAVENOUS
  Filled 2014-12-15: qty 4000

## 2014-12-15 MED ORDER — GI COCKTAIL ~~LOC~~
30.0000 mL | Freq: Once | ORAL | Status: AC
  Administered 2014-12-15: 30 mL via ORAL
  Filled 2014-12-15: qty 30

## 2014-12-15 MED ORDER — NITROGLYCERIN 0.4 MG SL SUBL
0.4000 mg | SUBLINGUAL_TABLET | SUBLINGUAL | Status: DC | PRN
  Administered 2014-12-15 (×2): 0.4 mg via SUBLINGUAL

## 2014-12-15 NOTE — ED Provider Notes (Signed)
CSN: [id number]     Arrival date & time 12/15/14  1839 History   First MD Initiated Contact with Patient 12/15/14 1853     Chief Complaint  Patient presents with  . Chest Pain     (Consider location/radiation/quality/duration/timing/severity/associated sxs/prior Treatment) HPI 44 year old male with past history of hypertension and a prior laparoscopic gastric band which was performed in 2007 presents to ED for achy exertional chest pain which wraps from underneath bilateral arms and into the center of his chest with radiation up to his jaw. Patient states symptoms began last week after he had his lap band fill removed. Patient states immediately following that procedure patient had acute onset of these symptoms while still in office. He received treatment before being discharged that day and states the treatment helped. Since then he reports having acute worsening of this same type of pain while exerting himself at the gym as well as walking up flights of stairs. Patient reports when symptoms start he will have mild shortness of breath. Symptoms will resolve with rest. Patient denies having any fevers, cough, nausea, vomiting, abdominal pain or other symptoms. Prior to this patient reports having issues with aspiration but states since the lap band fill has been removed he has had none and is now even able to lie flat which she was not able to do before this. Patient reports when pain comes on it will get to 10/10. Currently denies any pain or other symptoms. No other complaints at this time. Past Medical History  Diagnosis Date  . Vision problem     prior use of glasses, not currently  . Muscle weakness 1997    prior seizure like activity, but none since; prior neurology eval, prior normal EEG 1997  . Twin birth     has identical twin  . Hypertension 10/2012   Past Surgical History  Procedure Laterality Date  . Tonsillectomy    . Wisdom tooth extraction    . Laparoscopic gastric banding   2007    initial procedure Dr. Robb Matar in [place], adjustments in [place], [place]   Family History  Problem Relation Age of Onset  . Diabetes Mother   . Hypertension Mother   . Hyperlipidemia Mother   . Heart disease Mother 52    CABG  . Peripheral vascular disease Mother   . Hypertension Father   . Hyperlipidemia Father   . Heart disease Father 44    CABG  . Stroke Father   . Hypertension Brother   . Hyperlipidemia Brother   . Cancer Maternal Grandfather     stomach  . Cancer Paternal Grandmother    History  Substance Use Topics  . Smoking status: Never Smoker   . Smokeless tobacco: Not on file  . Alcohol Use: No    Review of Systems  Constitutional: Negative for fever, activity change and appetite change.  HENT: Negative for congestion, rhinorrhea and sore throat.   Eyes: Negative for visual disturbance.  Respiratory: Positive for shortness of breath. Negative for cough.   Cardiovascular: Positive for chest pain. Negative for palpitations and leg swelling.  Gastrointestinal: Negative for nausea, vomiting, abdominal pain and diarrhea.  Genitourinary: Negative for dysuria, flank pain, decreased urine volume and difficulty urinating.  Musculoskeletal: Negative for back pain and neck pain.  Skin: Negative for rash.  Neurological: Negative for dizziness, syncope, speech difficulty, weakness, light-headedness, numbness and headaches.  Psychiatric/Behavioral: Negative for confusion. The patient is nervous/anxious.       Allergies  Review of  patient's allergies indicates no known allergies.  Home Medications   Prior to Admission medications   Medication Sig Start Date End Date Taking? Authorizing Provider  lisinopril-hydrochlorothiazide (PRINZIDE,ZESTORETIC) 20-25 MG per tablet Take 1 tablet by mouth daily. 12/04/14   Kermit Baloavid S Tysinger, PA-C  omeprazole (PRILOSEC) 40 MG capsule Take 1 capsule (40 mg total) by mouth daily. 12/04/14   Kermit Baloavid S Tysinger, PA-C   BP 151/80  mmHg  Pulse 78  Temp(Src) 98.5 F (36.9 C) (Oral)  Resp 18  SpO2 100% Physical Exam  Constitutional: He is oriented to person, place, and time. He appears well-developed and well-nourished. No distress.  HENT:  Head: Normocephalic and atraumatic.  Nose: Nose normal.  Mouth/Throat: Oropharynx is clear and moist. No oropharyngeal exudate.  Eyes: Conjunctivae and EOM are normal.  Neck: Normal range of motion. Neck supple. No JVD present.  Cardiovascular: Normal rate, regular rhythm, normal heart sounds and intact distal pulses.   Pulmonary/Chest: Effort normal and breath sounds normal. No respiratory distress.  Abdominal: Soft. He exhibits no distension and no mass. There is no tenderness. There is no rebound and no guarding.  Musculoskeletal: Normal range of motion.  Neurological: He is alert and oriented to person, place, and time. No cranial nerve deficit.  Skin: Skin is warm and dry. No rash noted.  Psychiatric: He has a normal mood and affect.    ED Course  Procedures (including critical care time) Labs Review Labs Reviewed  CBC - Abnormal; Notable for the following:    WBC 11.9 (*)    All other components within normal limits  BASIC METABOLIC PANEL  HEPATIC FUNCTION PANEL  I-STAT TROPOININ, ED    Imaging Review Dg Chest 2 View  12/15/2014   CLINICAL DATA:  Chest pain for 1 week  EXAM: CHEST  2 VIEW  COMPARISON:  None.  FINDINGS: The heart size and mediastinal contours are within normal limits. Both lungs are clear. The visualized skeletal structures are unremarkable. Tubular structures projected over the left upper quadrant.  IMPRESSION: No active cardiopulmonary disease.   Electronically Signed   By: Sherian ReinWei-Chen  Lin M.D.   On: 12/15/2014 19:14     EKG Interpretation   Date/Time:  Friday December 15 2014 18:46:55 EST Ventricular Rate:  75 PR Interval:  139 QRS Duration: 104 QT Interval:  388 QTC Calculation: 433 R Axis:   63 Text Interpretation:  Sinus rhythm  Confirmed by HARRISON  MD, FORREST  ([id number]) on 12/15/2014 7:22:14 PM      MDM   Final diagnoses:  Chest pain    Hunter Gomez is a 44 y.o. male with H&P as above. Patient is HDS, NAD. -Pertinent historical findings: Presents with atypical chest pain which is exertional. Symptoms began after having lap band fill taken out last week. -Initial impression: Clinical picture likely related to LAP-BAND as symptoms started immediately following procedure last week. Exertional component does raise some concern for cardiac origin. Hear score low. Patient is currently not having any chest pain or other symptoms while in the ED. Screening labs have been sent. EKG shows normal sinus rhythm and NSTWA. Chest x-ray unremarkable  -Results: initial Tn 0.07. -Re-evaluation: no pain, VSS -Plan: delta Tn.  Please See Dr. Romeo AppleHarrison note for remainder of ED care and final disposition.  Pt seen in conjunction with Dr. Purvis SheffieldForrest Harrison, MD  Ames DuraStephen Tyresa Prindiville, DO [facility] Resident - PGY-2     Ames DuraStephen Tailey Top, MD 12/15/14 [id number]  Purvis SheffieldForrest Harrison, MD 12/16/14 1505

## 2014-12-15 NOTE — H&P (Signed)
PCP:   Ernst Breach, PA-C   Chief Complaint:  Chest pain  HPI: 44 year old Caucasian male with past medical history of hypertension, hyperlipidemia, family history of early cardiac vascular disease (both parents with CABG in their early 20s), both were heavy smokers so questionable secondhand smoking exposure who presented with chest pain. 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 Of note patient does have some discomfort in the chest from his GI problems. He has gastroesophageal reflux and is status post lap band in the lower esophagus. He recently had saline removed from the LAP-band on 12/05/2014. Subsequently patient developed chest pressure he first noticed it when he was working on the elliptical and had to stop. That he have more episodes while climbing one flight of stairs at work a few days ago. On the day of presentation patient had 3 episodes of chest pain including one while walking from the parking lot to the emergency department when he needed to stop in order for the chest pain to go away. Patient reported chest pain ranging from 5-10 out of 10 in intensity, radiating to his back and between shoulder blades and both shoulders, not associated with nausea or shortness of breath. Chest pain was relieved by nitroglycerin in the emergency department ( patient required nitroglycerin on 2 occasions ), patient also seemed to be relieved with rest.   patient had an episode of chest pain during my interview while resting in bed.  Patient denied active symptoms of shortness of breath, PND orthopnea, lower extremity edema, frequent or prolonged palpitations, lower extremity edema, nausea vomiting, fever, chills, dysuria.  Review of Systems:  12 systems were reviewed and were found to be  negative except mentioned in history of present illness  Past Medical History: Past Medical History  Diagnosis Date  . Vision problem     prior use of glasses, not currently  . Muscle weakness 1997    prior seizure like activity, but none since; prior neurology eval, prior normal EEG 1997  . Twin birth     has identical twin  . Hypertension 10/2012   hyperlipidemia Past Surgical History  Procedure Laterality Date  . Tonsillectomy    . Wisdom tooth extraction    . Laparoscopic gastric banding  2007    initial procedure Dr. Robb Matar in [place], adjustments in [place], [place]    Medications: Prior to Admission medications   Medication Sig Start Date End Date Taking? Authorizing Provider  acetaminophen (TYLENOL) 160 MG/5ML solution Take 240 mg by mouth every 6 (six) hours as needed for moderate pain.   Yes Historical Provider, MD  dextromethorphan (DELSYM) 30 MG/5ML liquid Take 60 mg by mouth as needed for cough.   Yes Historical Provider, MD  lisinopril-hydrochlorothiazide (PRINZIDE,ZESTORETIC) 20-25 MG per tablet Take 1 tablet by mouth daily. 12/04/14  Yes Kermit Balo Tysinger, PA-C  oxymetazoline (AFRIN) 0.05 % nasal spray Place 1 spray into both nostrils 2 (two) times daily as needed for congestion.   Yes Historical Provider, MD  omeprazole (PRILOSEC) 40 MG capsule Take 1 capsule (40 mg total) by mouth daily. 12/04/14   Jac Canavan, PA-C    Allergies:  No Known Allergies  Social History:  reports that he has never smoked. He does not have any smokeless tobacco history on file. He reports that he does  not drink alcohol or use illicit drugs.   Family History: Family History  Problem Relation Age of Onset  . Diabetes Mother   . Hypertension Mother   . Hyperlipidemia Mother   . Heart disease Mother [id number]    CABG  . Peripheral vascular disease Mother   . Hypertension Father   . Hyperlipidemia Father   . Heart disease Father [id number]    CABG  . Stroke Father   . Hypertension  Brother   . Hyperlipidemia Brother   . Cancer Maternal Grandfather     stomach  . Cancer Paternal Grandmother     PHYSICAL EXAM:  Filed Vitals:   12/15/14 2115 12/15/14 2130 12/15/14 2200 12/15/14 2215  BP: 110/70 112/74 109/72 118/74  Pulse: 65 63 72 61  Temp:      TempSrc:      Resp: 16 15 14 13   SpO2: 99% 100% 100% 100%  General:  Well appearing. No respiratory difficulty HEENT: normal Neck: supple. no JVD. Carotids 2+ bilat; no bruits. No lymphadenopathy or thryomegaly appreciated. Cor: PMI nondisplaced. Regular rate & rhythm. No rubs, gallops or murmurs. Lungs: clear Abdomen: soft, nontender, nondistended. No hepatosplenomegaly. No bruits or masses. Good bowel sounds. Extremities: no cyanosis, clubbing, rash, edema Neuro: alert & oriented x 3, cranial nerves grossly intact. moves all 4 extremities w/o difficulty. Affect pleasant.  Labs on Admission:   Recent Labs  12/15/14 1848  NA 138  K 3.8  CL 102  CO2 27  GLUCOSE 109*  BUN 10  CREATININE 0.74  CALCIUM 9.3    Recent Labs  12/15/14 1848  AST 27  ALT 30  ALKPHOS 88  BILITOT 0.4  PROT 7.2  ALBUMIN 4.5   No results for input(s): LIPASE, AMYLASE in the last 72 hours.  Recent Labs  12/15/14 1848  WBC 11.9*  HGB 14.5  HCT 43.4  MCV 84.9  PLT 381    Recent Labs  12/15/14 2154  TROPONINI 0.13*   Radiological Exams on Admission (all images were personally reviewed and interpreted by me, radiology reports were reviewed as well): Dg Chest 2 View  12/15/2014   CLINICAL DATA:  Chest pain for 1 week  EXAM: CHEST  2 VIEW  COMPARISON:  None.  FINDINGS: The heart size and mediastinal contours are within normal limits. Both lungs are clear. The visualized skeletal structures are unremarkable. Tubular structures projected over the left upper quadrant.  IMPRESSION: No active cardiopulmonary disease.   Electronically Signed   By: Sherian ReinWei-Chen  Lin M.D.   On: 12/15/2014 19:14   EKG personally reviewed and  interpreted by me: NSR 65 bpm, no acute ST-T changes  Assessment/Plan  44 year old patient presented with typical chest pain, mild troponin elevation, risk factors for coronary artery disease including family history, hypertension hyperlipidemia, overweight   Non-ST elevation MI Hypertension Hyperlipidemia  - Admit to stepdown giving recurrent chest pain at rest requiring nitroglycerin  - Heparin drip - Aspirin 81 mg daily, full dose was given in the emergency department - Metoprolol 25 mg twice a day  by mouth starting today - Lipitor 80 mg daily for dose now - Nothing by mouth - Check hemoglobin A1c, repeat EKG and troponin   Leon Montoya 12/15/2014, 11:02 PM

## 2014-12-15 NOTE — Progress Notes (Signed)
ANTICOAGULATION CONSULT NOTE - Initial Consult  Pharmacy Consult for Heparin Indication: chest pain/ACS  No Known Allergies  Patient Measurements: Height: 5' 8.11" (173 cm) IBW/kg (Calculated) : 68.65 Heparin Dosing Weight: 95 kg  Vital Signs: Temp: 98.5 F (36.9 C) (01/22 1849) Temp Source: Oral (01/22 1849) BP: 118/74 mmHg (01/22 2215) Pulse Rate: 61 (01/22 2215)  Labs:  Recent Labs  12/15/14 1848 12/15/14 2154  HGB 14.5  --   HCT 43.4  --   PLT 381  --   CREATININE 0.74  --   TROPONINI  --  0.13*    Estimated Creatinine Clearance: 136.1 mL/min (by C-G formula based on Cr of 0.74).   Medical History: Past Medical History  Diagnosis Date  . Vision problem     prior use of glasses, not currently  . Muscle weakness 1997    prior seizure like activity, but none since; prior neurology eval, prior normal EEG 1997  . Twin birth     has identical twin  . Hypertension 10/2012    Medications:  Zestoretic Prilosec  Assessment: 44 y.o. male with chest pain for heparin   Goal of Therapy:  Heparin level 0.3-0.7 Monitor platelets by anticoagulation protocol: Yes   Plan:  Heparin 4000 units IV bolus, then start heparin 1200 units/hr Check heparin level in 6 hours.   Eddie Candlebbott, Jolisa Intriago Vernon 12/15/2014,11:09 PM

## 2014-12-15 NOTE — ED Notes (Addendum)
Presents with chest pain that starts under both arms and runs through his chest and back. Pain has been intermittent since last Tuesday when he had his LapBand fill taken out, at that time he felt a sudden burst of pain in chest and Dr believed it to be esophageal spasms and gave him some medication that helped. The next day he experienced pain again with exertion and it has continued. Exertion makes chest pain worse, rest makes pain better. Denies SOB, dizziness, nausea.  Denies pain at this time, but has pain with any exertion.

## 2014-12-15 NOTE — ED Notes (Signed)
Patient states he needs to get up and walk around to see if the pain returns.  States it usually comes when he is up.

## 2014-12-16 ENCOUNTER — Inpatient Hospital Stay (HOSPITAL_COMMUNITY): Payer: BLUE CROSS/BLUE SHIELD

## 2014-12-16 ENCOUNTER — Encounter (HOSPITAL_COMMUNITY): Payer: Self-pay | Admitting: *Deleted

## 2014-12-16 DIAGNOSIS — R079 Chest pain, unspecified: Secondary | ICD-10-CM

## 2014-12-16 LAB — HEMOGLOBIN A1C
Hgb A1c MFr Bld: 5.5 % (ref <5.7)
MEAN PLASMA GLUCOSE: 111 mg/dL (ref <117)

## 2014-12-16 LAB — TROPONIN I: Troponin I: 0.11 ng/mL — ABNORMAL HIGH (ref <0.031)

## 2014-12-16 LAB — CBC
HCT: 39.4 % (ref 39.0–52.0)
Hemoglobin: 13.2 g/dL (ref 13.0–17.0)
MCH: 28 pg (ref 26.0–34.0)
MCHC: 33.5 g/dL (ref 30.0–36.0)
MCV: 83.5 fL (ref 78.0–100.0)
Platelets: 364 10*3/uL (ref 150–400)
RBC: 4.72 MIL/uL (ref 4.22–5.81)
RDW: 12.8 % (ref 11.5–15.5)
WBC: 10.5 10*3/uL (ref 4.0–10.5)

## 2014-12-16 LAB — BASIC METABOLIC PANEL
ANION GAP: 8 (ref 5–15)
BUN: 11 mg/dL (ref 6–23)
CO2: 26 mmol/L (ref 19–32)
CREATININE: 0.71 mg/dL (ref 0.50–1.35)
Calcium: 8.9 mg/dL (ref 8.4–10.5)
Chloride: 103 mmol/L (ref 96–112)
GFR calc Af Amer: >90 mL/min (ref >90)
GFR calc non Af Amer: >90 mL/min (ref >90)
GLUCOSE: 114 mg/dL — AB (ref 70–99)
Potassium: 4 mmol/L (ref 3.5–5.1)
SODIUM: 137 mmol/L (ref 135–145)

## 2014-12-16 LAB — HEPARIN LEVEL (UNFRACTIONATED)
Heparin Unfractionated: 0.47 IU/mL (ref 0.30–0.70)
Heparin Unfractionated: 0.1 IU/mL — ABNORMAL LOW (ref 0.30–0.70)

## 2014-12-16 LAB — MRSA PCR SCREENING: MRSA by PCR: NEGATIVE

## 2014-12-16 MED ORDER — HEPARIN (PORCINE) IN NACL 100-0.45 UNIT/ML-% IJ SOLN
1250.0000 [IU]/h | INTRAMUSCULAR | Status: DC
  Administered 2014-12-16: 1200 [IU]/h via INTRAVENOUS
  Administered 2014-12-16: 1400 [IU]/h via INTRAVENOUS
  Administered 2014-12-17: 1250 [IU]/h via INTRAVENOUS
  Filled 2014-12-16 (×5): qty 250

## 2014-12-16 MED ORDER — HEPARIN BOLUS VIA INFUSION
2000.0000 [IU] | Freq: Once | INTRAVENOUS | Status: AC
  Administered 2014-12-16: 2000 [IU] via INTRAVENOUS
  Filled 2014-12-16: qty 2000

## 2014-12-16 MED ORDER — ONDANSETRON HCL 4 MG/2ML IJ SOLN: 4.0000 mg | Freq: Four times a day (QID) | INTRAMUSCULAR | Status: DC | PRN

## 2014-12-16 MED ORDER — METOPROLOL TARTRATE 25 MG PO TABS
25.0000 mg | ORAL_TABLET | Freq: Two times a day (BID) | ORAL | Status: DC
  Administered 2014-12-16 – 2014-12-17 (×5): 25 mg via ORAL
  Filled 2014-12-16 (×9): qty 1

## 2014-12-16 MED ORDER — PANTOPRAZOLE SODIUM 40 MG PO TBEC
40.0000 mg | DELAYED_RELEASE_TABLET | Freq: Every day | ORAL | Status: DC
  Administered 2014-12-16 – 2014-12-18 (×3): 40 mg via ORAL
  Filled 2014-12-16 (×3): qty 1

## 2014-12-16 MED ORDER — ACETAMINOPHEN 325 MG PO TABS: 650.0000 mg | ORAL_TABLET | ORAL | Status: DC | PRN

## 2014-12-16 MED ORDER — NITROGLYCERIN IN D5W 200-5 MCG/ML-% IV SOLN
3.0000 ug/min | INTRAVENOUS | Status: DC
  Administered 2014-12-16: 3 ug/min via INTRAVENOUS
  Filled 2014-12-16: qty 250

## 2014-12-16 MED ORDER — IOHEXOL 350 MG/ML SOLN
80.0000 mL | Freq: Once | INTRAVENOUS | Status: AC | PRN
  Administered 2014-12-16: 80 mL via INTRAVENOUS

## 2014-12-16 MED ORDER — ZOLPIDEM TARTRATE 5 MG PO TABS: 5.0000 mg | ORAL_TABLET | Freq: Every evening | ORAL | Status: DC | PRN

## 2014-12-16 MED ORDER — ASPIRIN EC 81 MG PO TBEC
81.0000 mg | DELAYED_RELEASE_TABLET | Freq: Every day | ORAL | Status: DC
  Administered 2014-12-16 – 2014-12-17 (×2): 81 mg via ORAL
  Filled 2014-12-16 (×3): qty 1

## 2014-12-16 MED ORDER — ATORVASTATIN CALCIUM 80 MG PO TABS
80.0000 mg | ORAL_TABLET | Freq: Every day | ORAL | Status: DC
  Administered 2014-12-16 – 2014-12-22 (×5): 80 mg via ORAL
  Filled 2014-12-16 (×9): qty 1

## 2014-12-16 NOTE — Progress Notes (Signed)
Patient ID: Rinaldo CloudBradley Dockstader, male   DOB: 1971-01-20, 44 y.o.   MRN: [id number]    Subjective:  Denies SSCP, palpitations or Dyspnea   Objective:  Filed Vitals:   12/16/14 0400 12/16/14 0500 12/16/14 0600 12/16/14 0700  BP: 97/68 108/69 102/72 124/87  Pulse: 55 52 57 53  Temp:      TempSrc:      Resp: 11 13 14 13   Height:      Weight:      SpO2: 100% 97% 97% 98%    Intake/Output from previous day:  Intake/Output Summary (Last 24 hours) at 12/16/14 0804 Last data filed at 12/16/14 0700  Gross per 24 hour  Intake  85.75 ml  Output    300 ml  Net -214.25 ml    Physical Exam: Affect appropriate Healthy:  appears stated age HEENT: normal Neck supple with no adenopathy JVP normal no bruits no thyromegaly Lungs clear with no wheezing and good diaphragmatic motion Heart:  S1/S2 no murmur, no rub, gallop or click PMI normal Abdomen: benign previous lapband no bruit.  No HSM or HJR Distal pulses intact with no bruits No edema Neuro non-focal Skin warm and dry No muscular weakness   Lab Results: Basic Metabolic Panel:  Recent Labs  14/78/2901/22/16 1848 12/16/14 0236  NA 138 137  K 3.8 4.0  CL 102 103  CO2 27 26  GLUCOSE 109* 114*  BUN 10 11  CREATININE 0.74 0.71  CALCIUM 9.3 8.9   Liver Function Tests:  Recent Labs  12/15/14 1848  AST 27  ALT 30  ALKPHOS 88  BILITOT 0.4  PROT 7.2  ALBUMIN 4.5   CBC:  Recent Labs  12/15/14 1848 12/16/14 0236  WBC 11.9* 10.5  HGB 14.5 13.2  HCT 43.4 39.4  MCV 84.9 83.5  PLT 381 364   Cardiac Enzymes:  Recent Labs  12/15/14 2154 12/16/14 0236  TROPONINI 0.13* 0.11*    Imaging: Dg Chest 2 View  12/15/2014   CLINICAL DATA:  Chest pain for 1 week  EXAM: CHEST  2 VIEW  COMPARISON:  None.  FINDINGS: The heart size and mediastinal contours are within normal limits. Both lungs are clear. The visualized skeletal structures are unremarkable. Tubular structures projected over the left upper quadrant.  IMPRESSION: No  active cardiopulmonary disease.   Electronically Signed   By: Sherian ReinWei-Chen  Lin M.D.   On: 12/15/2014 19:14    Cardiac Studies:  ECG:   SR normal      Telemetry:  SR normal   Echo:   Medications:   . aspirin EC  81 mg Oral Daily  . atorvastatin  80 mg Oral q1800  . metoprolol tartrate  25 mg Oral BID  . pantoprazole  40 mg Oral Daily     . heparin 1,200 Units/hr (12/16/14 0020)  . nitroGLYCERIN 3 mcg/min (12/16/14 0037)    Assessment/Plan:  Chest Pain:  Some atypical features Previous lapband with GERD.  Minimally positive troponin , normal ECG  Positive family history.  Favor cardiac CT to risk stratify  Discussed with patient willing to proceed D/C heparin continue nitro for study.  Beta blocker this am  Nurse will start 18 g anticubital iv Chol:  Continue statin  LapBand:  Continue pantoprazole needs outpatient GI f/u   Charlton Hawseter Jireh Elmore 12/16/2014, 8:04 AM

## 2014-12-16 NOTE — Progress Notes (Addendum)
ANTICOAGULATION CONSULT NOTE - Follow Up Consult  Pharmacy Consult for heparin Indication: chest pain/ACS  No Known Allergies  Patient Measurements: Height: 5\' 9"  (175.3 cm) Weight: 223 lb 8.7 oz (101.4 kg) IBW/kg (Calculated) : 70.7 Heparin Dosing Weight: 95 kg  Vital Signs: Temp: 97.8 F (36.6 C) (01/23 1200) Temp Source: Oral (01/23 1200) BP: 130/77 mmHg (01/23 1200) Pulse Rate: 58 (01/23 1200)  Labs:  Recent Labs  12/15/14 1848 12/15/14 2154 12/16/14 0236 12/16/14 0632  HGB 14.5  --  13.2  --   HCT 43.4  --  39.4  --   PLT 381  --  364  --   HEPARINUNFRC  --   --   --  0.47  CREATININE 0.74  --  0.71  --   TROPONINI  --  0.13* 0.11*  --     Estimated Creatinine Clearance: 139.8 mL/min (by C-G formula based on Cr of 0.71).   Medications:  Scheduled:  . aspirin EC  81 mg Oral Daily  . atorvastatin  80 mg Oral q1800  . metoprolol tartrate  25 mg Oral BID  . pantoprazole  40 mg Oral Daily   Infusions:  . nitroGLYCERIN Stopped (12/16/14 1207)    Assessment: 44 yo m admitted on 1/22 for CP.  Pharmacy was consulted to begin heparin last night.  Heparin d/c'ed this AM ~ 0800.  Pharmacy was re-consulted to resume heparin this afternoon.  Will continue current rate patient was on this AM as patient was therapeutic (HL was 0.47). Hgb 13.2, plts 364, no bleeding noted per RN.  Goal of Therapy:  Heparin level 0.3-0.7 units/ml Monitor platelets by anticoagulation protocol: Yes   Plan:  Heparin infusion at 1200 units/hr 6-hr HL @ 2100 Monitor hgb/plts, s/s of bleeding, clinical course  Cassie L. Roseanne RenoStewart, PharmD Clinical Pharmacy Resident Pager: [phone] 12/16/2014 2:42 PM   Follow up heparin level now low at 0.1? No infusion issues per nursing. Nurse did call back and stated that patient went to CT sometime this afternoon and that "everything was taken off". Appears that heparin was restarted after that time.   Plan Bolus with 2000 units now then increase  infusion rate to 1400 units/hr. Recheck HL in 6 hours  Sheppard CoilFrank Wilson PharmD., BCPS Clinical Pharmacist Pager [phone] 12/16/2014 10:19 PM

## 2014-12-17 LAB — CBC
HEMATOCRIT: 39.3 % (ref 39.0–52.0)
Hemoglobin: 13.2 g/dL (ref 13.0–17.0)
MCH: 28.3 pg (ref 26.0–34.0)
MCHC: 33.6 g/dL (ref 30.0–36.0)
MCV: 84.3 fL (ref 78.0–100.0)
Platelets: 347 10*3/uL (ref 150–400)
RBC: 4.66 MIL/uL (ref 4.22–5.81)
RDW: 12.8 % (ref 11.5–15.5)
WBC: 9.9 10*3/uL (ref 4.0–10.5)

## 2014-12-17 LAB — HEPARIN LEVEL (UNFRACTIONATED)
Heparin Unfractionated: 0.8 IU/mL — ABNORMAL HIGH (ref 0.30–0.70)
Heparin Unfractionated: 0.38 IU/mL (ref 0.30–0.70)
Heparin Unfractionated: 0.58 IU/mL (ref 0.30–0.70)

## 2014-12-17 NOTE — Progress Notes (Signed)
Utilization review completed.  

## 2014-12-17 NOTE — Progress Notes (Signed)
Patient ID: Hunter Gomez, male   DOB: 09/08/1971, 44 y.o.   MRN: [id number] Patient ID: Hunter Gomez, male   DOB: 08/14/1971, 44 y.o.   MRN: [id number]    Subjective:  Denies SSCP, palpitations or Dyspnea   Objective:  Filed Vitals:   12/16/14 2300 12/17/14 0000 12/17/14 0412 12/17/14 0759  BP: 108/58  101/57 112/66  Pulse: 67 66 62 64  Temp: 97.8 F (36.6 C)  97.5 F (36.4 C) 98 F (36.7 C)  TempSrc: Oral  Oral Oral  Resp: 16 15 11 14  Height:      Weight:      SpO2: 99% 96% 100% 98%    Intake/Output from previous day:  Intake/Output Summary (Last 24 hours) at 12/17/14 1000 Last data filed at 12/17/14 0800  Gross per 24 hour  Intake 999.78 ml  Output   1525 ml  Net -525.22 ml    Physical Exam: Affect appropriate Healthy:  appears stated age HEENT: normal Neck supple with no adenopathy JVP normal no bruits no thyromegaly Lungs clear with no wheezing and good diaphragmatic motion Heart:  S1/S2 no murmur, no rub, gallop or click PMI normal Abdomen: benign previous lapband no bruit.  No HSM or HJR Distal pulses intact with no bruits No edema Neuro non-focal Skin warm and dry No muscular weakness   Lab Results: Basic Metabolic Panel:  Recent Labs  12/15/14 1848 12/16/14 0236  NA 138 137  K 3.8 4.0  CL 102 103  CO2 27 26  GLUCOSE 109* 114*  BUN 10 11  CREATININE 0.74 0.71  CALCIUM 9.3 8.9   Liver Function Tests:  Recent Labs  12/15/14 1848  AST 27  ALT 30  ALKPHOS 88  BILITOT 0.4  PROT 7.2  ALBUMIN 4.5   CBC:  Recent Labs  12/16/14 0236 12/17/14 0344  WBC 10.5 9.9  HGB 13.2 13.2  HCT 39.4 39.3  MCV 83.5 84.3  PLT 364 347   Cardiac Enzymes:  Recent Labs  12/15/14 2154 12/16/14 0236  TROPONINI 0.13* 0.11*    Imaging: Dg Chest 2 View  12/15/2014   CLINICAL DATA:  Chest pain for 1 week  EXAM: CHEST  2 VIEW  COMPARISON:  None.  FINDINGS: The heart size and mediastinal contours are within normal limits. Both lungs are clear. The  visualized skeletal structures are unremarkable. Tubular structures projected over the left upper quadrant.  IMPRESSION: No active cardiopulmonary disease.   Electronically Signed   By: Wei-Chen  Lin M.D.   On: 12/15/2014 19:14   Ct Coronary Morp W/cta Cor W/score W/ca W/cm &/or Wo/cm  12/16/2014   ADDENDUM REPORT: 12/16/2014 13:51  CLINICAL DATA:  Chest pain  EXAM: Cardiac CTA  MEDICATIONS: Sub lingual nitro. 4mg and lopressor 25mg  TECHNIQUE: The patient was scanned on a Philips 256 slice scanner. Gantry rotation speed was 270 msecs. Collimation was .9mm. A 100 kV prospective scan was triggered in the descending thoracic aorta at 111 HU's with 5% padding centered around 78% of the R-R interval. Average HR during the scan was 62 bpm. The 3D data set was interpreted on a dedicated work station using MPR, MIP and VRT modes. A total of 80cc of contrast was used.  FINDINGS: Non-cardiac: See separate report from [facility]. Distal esophageal thickening? From previous lap-band Gallstones with no inflammation  Calcium Score:  571 all 3 vessels  Coronary Arteries: Right dominant with no anomalies  LM:  Mild soft plaque  LAD: Ostial LAD and proximal LAD   with mixed plaque cannot r/o greater than 70% stenosis mid and distal LAD less than 40% calcific disease  D1:  Less than 40% calcific disease  Circumflex:  50% or greater mixed plaque in proximal vessel  OM1:  Less than 40% calcific disease  RCA: Dominant Less than 40% calcific disease in proximal mid and distal vessel  IMPRESSION:  1) Age advanced coronary calcification in all 3 major epicardial vessels 571 100th percentile for age and sex matched controls  2) Possible greater than 70% mixed plaque in proximal LAD and moderate disease in proximal circumflex  3) Distal esophageal thickening  4) Gallstones with no inflammation  Patient will be referred for cath if LAD lesion appears borderline by angio would consider FFR  Joseline Mccampbell   Electronically Signed    By: Zailyn Rowser M.D.   On: 12/16/2014 13:51   12/16/2014   EXAM: OVER-READ INTERPRETATION  CT CHEST  The following report is an over-read performed by radiologist Dr. Stewart [facility] on 12/16/2014. This over-read does not include interpretation of cardiac or coronary anatomy or pathology. The coronary CTA interpretation by the cardiologist is attached.  COMPARISON:  Chest radiograph 12/15/2014  FINDINGS: Limited view of the lungs demonstrates mild ground-glass opacities at the lung bases most consistent with mild atelectasis. No pleural fluid.  There is diffuse thickening of the esophagus from the carina to the GE junction. Gastric banding device is present at the gastric cardia. Multiple gallstones are noted within the gallbladder without inflammation. No aggressive osseous lesion.  IMPRESSION: 1. Thickened distal esophagus suggests esophagitis which may relate to banding device. 2. Cholelithiasis without cholecystitis.  Electronically Signed: By: Stewart  Edmunds M.D. On: 12/16/2014 11:28    Cardiac Studies:  ECG:   SR normal      Telemetry:  SR normal   Echo:   Medications:   . aspirin EC  81 mg Oral Daily  . atorvastatin  80 mg Oral q1800  . metoprolol tartrate  25 mg Oral BID  . pantoprazole  40 mg Oral Daily     . heparin 1,250 Units/hr (12/17/14 0429)  . nitroGLYCERIN Stopped (12/16/14 1207)    Assessment/Plan:  Chest Pain:  Some atypical features Previous lapband with GERD.  Minimally positive troponin , normal ECG  Positive family history.  Cardiac CT with very high calcium score And possible severe 2 VD especially proximal LAD  Cath in am orders written and on board Risks including stroke , MI, bleeding and need for emergency surgery discussed williling to proceed Continue ASA, heparin and nitro  Chol:  Continue statin  LapBand:  Continue pantoprazole needs outpatient GI f/u has some esophageal thickening on CT   Jules Vidovich 12/17/2014, 10:00  AM     

## 2014-12-17 NOTE — Progress Notes (Addendum)
ANTICOAGULATION CONSULT NOTE - Follow Up Consult  Pharmacy Consult for Heparin Indication: chest pain/ACS  No Known Allergies  Patient Measurements: Height: 5\' 9"  (175.3 cm) Weight: 223 lb 8.7 oz (101.4 kg) IBW/kg (Calculated) : 70.7 Heparin Dosing Weight: 95 kg  Vital Signs: Temp: 97.9 F (36.6 C) (01/24 1150) Temp Source: Oral (01/24 1150) BP: 121/66 mmHg (01/24 1150) Pulse Rate: 64 (01/24 1150)  Labs:  Recent Labs  12/15/14 1848 12/15/14 2154 12/16/14 0236  12/16/14 2053 12/17/14 0344 12/17/14 1241  HGB 14.5  --  13.2  --   --  13.2  --   HCT 43.4  --  39.4  --   --  39.3  --   PLT 381  --  364  --   --  347  --   HEPARINUNFRC  --   --   --   < > 0.10* 0.80* 0.38  CREATININE 0.74  --  0.71  --   --   --   --   TROPONINI  --  0.13* 0.11*  --   --   --   --   < > = values in this interval not displayed.  Estimated Creatinine Clearance: 139.8 mL/min (by C-G formula based on Cr of 0.71).   Medications:  Scheduled:  . aspirin EC  81 mg Oral Daily  . atorvastatin  80 mg Oral q1800  . metoprolol tartrate  25 mg Oral BID  . pantoprazole  40 mg Oral Daily   Infusions:  . heparin 1,250 Units/hr (12/17/14 0429)  . nitroGLYCERIN Stopped (12/16/14 1207)    Assessment: 44 yo m admitted with CP.  Pharmacy is consulted to dose heparin.  HL this afternoon is therapeutic at 0.38.  Hgb 13.2, plts 347, no bleeding noted.   Goal of Therapy:  Heparin level 0.3-0.7 units/ml Monitor platelets by anticoagulation protocol: Yes   Plan:  Continue heparin infusion at 1250 units/hr 6-hr HL @ 2000 Monitor hgb/plts, s/s of bleeding, clinical course  Cassie L. Roseanne RenoStewart, PharmD Clinical Pharmacy Resident Pager: [phone] 12/17/2014 2:12 PM   Follow up heparin level still in middle of target range at (0.5). No bleeding issues noted.  Will continue heparin at 1250 units/hr. Recheck with am labs.  Sheppard CoilFrank Wilson PharmD., BCPS Clinical Pharmacist Pager [phone] 12/17/2014 8:27  PM

## 2014-12-17 NOTE — Progress Notes (Signed)
ANTICOAGULATION CONSULT NOTE - Follow Up Consult  Pharmacy Consult for Heparin  Indication: chest pain/ACS  No Known Allergies  Patient Measurements: Height: 5\' 9"  (175.3 cm) Weight: 223 lb 8.7 oz (101.4 kg) IBW/kg (Calculated) : 70.7  Vital Signs: Temp: 97.5 F (36.4 C) (01/24 0412) Temp Source: Oral (01/24 0412) BP: 101/57 mmHg (01/24 0412) Pulse Rate: 62 (01/24 0412)  Labs:  Recent Labs  12/15/14 1848 12/15/14 2154 12/16/14 0236 12/16/14 [id number] 12/16/14 2053 12/17/14 0344  HGB 14.5  --  13.2  --   --  13.2  HCT 43.4  --  39.4  --   --  39.3  PLT 381  --  364  --   --  347  HEPARINUNFRC  --   --   --  0.47 0.10* 0.80*  CREATININE 0.74  --  0.71  --   --   --   TROPONINI  --  0.13* 0.11*  --   --   --     Estimated Creatinine Clearance: 139.8 mL/min (by C-G formula based on Cr of 0.71).  Assessment: Supra-therapeutic heparin level, no issues per RN.   Goal of Therapy:  Heparin level 0.3-0.7 units/ml Monitor platelets by anticoagulation protocol: Yes   Plan:  -Decrease heparin drip to 1250 units/hr -1200 HL -Daily CBC/HL -Monitor for bleeding  Abran DukeLedford, Jlon Betker 12/17/2014,4:28 AM

## 2014-12-18 ENCOUNTER — Inpatient Hospital Stay (HOSPITAL_COMMUNITY): Payer: BLUE CROSS/BLUE SHIELD | Admitting: Certified Registered"

## 2014-12-18 ENCOUNTER — Inpatient Hospital Stay (HOSPITAL_COMMUNITY): Payer: BLUE CROSS/BLUE SHIELD

## 2014-12-18 ENCOUNTER — Encounter (HOSPITAL_COMMUNITY)
Admission: EM | Disposition: A | Discharge status: Home / Self Care | Payer: Self-pay | Source: Home / Self Care | Attending: Cardiothoracic Surgery

## 2014-12-18 ENCOUNTER — Encounter (HOSPITAL_COMMUNITY): Payer: Self-pay | Admitting: Certified Registered Nurse Anesthetist

## 2014-12-18 ENCOUNTER — Other Ambulatory Visit: Payer: Self-pay | Admitting: *Deleted

## 2014-12-18 ENCOUNTER — Encounter (HOSPITAL_COMMUNITY)
Admission: EM | Disposition: A | Discharge status: Home / Self Care | Payer: BLUE CROSS/BLUE SHIELD | Source: Home / Self Care | Attending: Cardiothoracic Surgery

## 2014-12-18 DIAGNOSIS — Z951 Presence of aortocoronary bypass graft: Secondary | ICD-10-CM

## 2014-12-18 DIAGNOSIS — I2510 Atherosclerotic heart disease of native coronary artery without angina pectoris: Secondary | ICD-10-CM

## 2014-12-18 DIAGNOSIS — Z01810 Encounter for preprocedural cardiovascular examination: Secondary | ICD-10-CM

## 2014-12-18 DIAGNOSIS — I25110 Atherosclerotic heart disease of native coronary artery with unstable angina pectoris: Secondary | ICD-10-CM

## 2014-12-18 HISTORY — PX: LEFT HEART CATHETERIZATION WITH CORONARY ANGIOGRAM: SHX5451

## 2014-12-18 HISTORY — PX: INTRAOPERATIVE TRANSESOPHAGEAL ECHOCARDIOGRAM: SHX5062

## 2014-12-18 HISTORY — PX: CORONARY ARTERY BYPASS GRAFT: SHX141

## 2014-12-18 LAB — URINALYSIS, ROUTINE W REFLEX MICROSCOPIC
Bilirubin Urine: NEGATIVE
Glucose, UA: NEGATIVE mg/dL
Hgb urine dipstick: NEGATIVE
Ketones, ur: NEGATIVE mg/dL
Leukocytes, UA: NEGATIVE
Nitrite: NEGATIVE
Protein, ur: NEGATIVE mg/dL
Specific Gravity, Urine: 1.039 — ABNORMAL HIGH (ref 1.005–1.030)
Urobilinogen, UA: 0.2 mg/dL (ref 0.0–1.0)
pH: 6 (ref 5.0–8.0)

## 2014-12-18 LAB — COMPREHENSIVE METABOLIC PANEL
ALT: 34 U/L (ref 0–53)
AST: 28 U/L (ref 0–37)
Albumin: 3.9 g/dL (ref 3.5–5.2)
Alkaline Phosphatase: 83 U/L (ref 39–117)
Anion gap: 5 (ref 5–15)
BUN: 10 mg/dL (ref 6–23)
CO2: 29 mmol/L (ref 19–32)
Calcium: 8.9 mg/dL (ref 8.4–10.5)
Chloride: 104 mmol/L (ref 96–112)
Creatinine, Ser: 0.79 mg/dL (ref 0.50–1.35)
GFR calc Af Amer: >90 mL/min (ref >90)
GFR calc non Af Amer: >90 mL/min (ref >90)
Glucose, Bld: 102 mg/dL — ABNORMAL HIGH (ref 70–99)
Potassium: 4.1 mmol/L (ref 3.5–5.1)
Sodium: 138 mmol/L (ref 135–145)
Total Bilirubin: 0.6 mg/dL (ref 0.3–1.2)
Total Protein: 6.7 g/dL (ref 6.0–8.3)

## 2014-12-18 LAB — POCT I-STAT, CHEM 8
BUN: 9 mg/dL (ref 6–23)
BUN: 10 mg/dL (ref 6–23)
BUN: 9 mg/dL (ref 6–23)
BUN: 8 mg/dL (ref 6–23)
BUN: 8 mg/dL (ref 6–23)
CALCIUM ION: 1.06 mmol/L — AB (ref 1.12–1.23)
CHLORIDE: 103 mmol/L (ref 96–112)
CHLORIDE: 103 mmol/L (ref 96–112)
Calcium, Ion: 1.14 mmol/L (ref 1.12–1.23)
Calcium, Ion: 0.98 mmol/L — ABNORMAL LOW (ref 1.12–1.23)
Calcium, Ion: 1.21 mmol/L (ref 1.12–1.23)
Calcium, Ion: 1.63 mmol/L — ABNORMAL HIGH (ref 1.12–1.23)
Chloride: 107 mmol/L (ref 96–112)
Chloride: 95 mmol/L — ABNORMAL LOW (ref 96–112)
Chloride: 100 mmol/L (ref 96–112)
Creatinine, Ser: 0.5 mg/dL (ref 0.50–1.35)
Creatinine, Ser: 0.5 mg/dL (ref 0.50–1.35)
Creatinine, Ser: 0.5 mg/dL (ref 0.50–1.35)
Creatinine, Ser: 0.6 mg/dL (ref 0.50–1.35)
Creatinine, Ser: 0.6 mg/dL (ref 0.50–1.35)
GLUCOSE: 141 mg/dL — AB (ref 70–99)
Glucose, Bld: 103 mg/dL — ABNORMAL HIGH (ref 70–99)
Glucose, Bld: 120 mg/dL — ABNORMAL HIGH (ref 70–99)
Glucose, Bld: 99 mg/dL (ref 70–99)
Glucose, Bld: 172 mg/dL — ABNORMAL HIGH (ref 70–99)
HCT: 36 % — ABNORMAL LOW (ref 39.0–52.0)
HCT: 26 % — ABNORMAL LOW (ref 39.0–52.0)
HCT: 27 % — ABNORMAL LOW (ref 39.0–52.0)
HEMATOCRIT: 35 % — AB (ref 39.0–52.0)
HEMATOCRIT: 28 % — AB (ref 39.0–52.0)
HEMOGLOBIN: 12.2 g/dL — AB (ref 13.0–17.0)
HEMOGLOBIN: 11.9 g/dL — AB (ref 13.0–17.0)
Hemoglobin: 8.8 g/dL — ABNORMAL LOW (ref 13.0–17.0)
Hemoglobin: 9.2 g/dL — ABNORMAL LOW (ref 13.0–17.0)
Hemoglobin: 9.5 g/dL — ABNORMAL LOW (ref 13.0–17.0)
POTASSIUM: 3.6 mmol/L (ref 3.5–5.1)
Potassium: 3.8 mmol/L (ref 3.5–5.1)
Potassium: 3.7 mmol/L (ref 3.5–5.1)
Potassium: 3.9 mmol/L (ref 3.5–5.1)
Potassium: 4 mmol/L (ref 3.5–5.1)
SODIUM: 136 mmol/L (ref 135–145)
Sodium: 138 mmol/L (ref 135–145)
Sodium: 135 mmol/L (ref 135–145)
Sodium: 138 mmol/L (ref 135–145)
Sodium: 137 mmol/L (ref 135–145)
TCO2: 24 mmol/L (ref 0–100)
TCO2: 23 mmol/L (ref 0–100)
TCO2: 24 mmol/L (ref 0–100)
TCO2: 22 mmol/L (ref 0–100)
TCO2: 22 mmol/L (ref 0–100)

## 2014-12-18 LAB — POCT I-STAT 4, (NA,K, GLUC, HGB,HCT)
Glucose, Bld: 115 mg/dL — ABNORMAL HIGH (ref 70–99)
HEMATOCRIT: 33 % — AB (ref 39.0–52.0)
Hemoglobin: 11.2 g/dL — ABNORMAL LOW (ref 13.0–17.0)
POTASSIUM: 3.7 mmol/L (ref 3.5–5.1)
Sodium: 139 mmol/L (ref 135–145)

## 2014-12-18 LAB — PLATELET COUNT: Platelets: 263 10*3/uL (ref 150–400)

## 2014-12-18 LAB — POCT I-STAT GLUCOSE
Glucose, Bld: 125 mg/dL — ABNORMAL HIGH (ref 70–99)
OPERATOR ID: 3408

## 2014-12-18 LAB — PROTIME-INR
INR: 1.18 (ref 0.00–1.49)
INR: 1.25 (ref 0.00–1.49)
PROTHROMBIN TIME: 15.1 s (ref 11.6–15.2)
Prothrombin Time: 15.9 seconds — ABNORMAL HIGH (ref 11.6–15.2)

## 2014-12-18 LAB — CBC
HCT: 38.4 % — ABNORMAL LOW (ref 39.0–52.0)
HCT: 31.8 % — ABNORMAL LOW (ref 39.0–52.0)
HEMOGLOBIN: 12.9 g/dL — AB (ref 13.0–17.0)
Hemoglobin: 10.8 g/dL — ABNORMAL LOW (ref 13.0–17.0)
MCH: 28 pg (ref 26.0–34.0)
MCH: 27.8 pg (ref 26.0–34.0)
MCHC: 33.6 g/dL (ref 30.0–36.0)
MCHC: 34 g/dL (ref 30.0–36.0)
MCV: 83.3 fL (ref 78.0–100.0)
MCV: 82 fL (ref 78.0–100.0)
PLATELETS: 329 10*3/uL (ref 150–400)
Platelets: 240 10*3/uL (ref 150–400)
RBC: 4.61 MIL/uL (ref 4.22–5.81)
RBC: 3.88 MIL/uL — ABNORMAL LOW (ref 4.22–5.81)
RDW: 12.9 % (ref 11.5–15.5)
RDW: 12.7 % (ref 11.5–15.5)
WBC: 9.7 10*3/uL (ref 4.0–10.5)
WBC: 20.9 10*3/uL — ABNORMAL HIGH (ref 4.0–10.5)

## 2014-12-18 LAB — POCT I-STAT 3, ART BLOOD GAS (G3+)
Acid-Base Excess: 3 mmol/L — ABNORMAL HIGH (ref 0.0–2.0)
Acid-Base Excess: 2 mmol/L (ref 0.0–2.0)
Acid-base deficit: 2 mmol/L (ref 0.0–2.0)
Acid-base deficit: 2 mmol/L (ref 0.0–2.0)
BICARBONATE: 27.4 meq/L — AB (ref 20.0–24.0)
BICARBONATE: 24.6 meq/L — AB (ref 20.0–24.0)
Bicarbonate: 23.5 mEq/L (ref 20.0–24.0)
Bicarbonate: 29.2 mEq/L — ABNORMAL HIGH (ref 20.0–24.0)
O2 SAT: 100 %
O2 Saturation: 96 %
O2 Saturation: 100 %
O2 Saturation: 99 %
PCO2 ART: 39.8 mmHg (ref 35.0–45.0)
PH ART: 7.359 (ref 7.350–7.450)
PO2 ART: 483 mmHg — AB (ref 80.0–100.0)
PO2 ART: 165 mmHg — AB (ref 80.0–100.0)
TCO2: 25 mmol/L (ref 0–100)
TCO2: 31 mmol/L (ref 0–100)
TCO2: 29 mmol/L (ref 0–100)
TCO2: 26 mmol/L (ref 0–100)
pCO2 arterial: 51.8 mmHg — ABNORMAL HIGH (ref 35.0–45.0)
pCO2 arterial: 46.2 mmHg — ABNORMAL HIGH (ref 35.0–45.0)
pCO2 arterial: 49.8 mmHg — ABNORMAL HIGH (ref 35.0–45.0)
pH, Arterial: 7.38 (ref 7.350–7.450)
pH, Arterial: 7.381 (ref 7.350–7.450)
pH, Arterial: 7.301 — ABNORMAL LOW (ref 7.350–7.450)
pO2, Arterial: 87 mmHg (ref 80.0–100.0)
pO2, Arterial: 337 mmHg — ABNORMAL HIGH (ref 80.0–100.0)

## 2014-12-18 LAB — APTT
aPTT: 158 seconds — ABNORMAL HIGH (ref 24–37)
aPTT: 28 seconds (ref 24–37)

## 2014-12-18 LAB — PREPARE RBC (CROSSMATCH)

## 2014-12-18 LAB — HEPARIN LEVEL (UNFRACTIONATED): HEPARIN UNFRACTIONATED: 0.5 [IU]/mL (ref 0.30–0.70)

## 2014-12-18 LAB — HEMOGLOBIN AND HEMATOCRIT, BLOOD
HCT: 28.3 % — ABNORMAL LOW (ref 39.0–52.0)
Hemoglobin: 9.8 g/dL — ABNORMAL LOW (ref 13.0–17.0)

## 2014-12-18 LAB — ABO/RH: ABO/RH(D): A POS

## 2014-12-18 SURGERY — LEFT HEART CATHETERIZATION WITH CORONARY ANGIOGRAM
Anesthesia: LOCAL

## 2014-12-18 SURGERY — CORONARY ARTERY BYPASS GRAFTING (CABG)
Anesthesia: General | Site: Chest

## 2014-12-18 MED ORDER — LACTATED RINGERS IV SOLN
INTRAVENOUS | Status: DC
  Administered 2014-12-18: 20:00:00 via INTRAVENOUS

## 2014-12-18 MED ORDER — CEFUROXIME SODIUM 750 MG IJ SOLR
750.0000 mg | INTRAMUSCULAR | Status: DC
  Filled 2014-12-18: qty 750

## 2014-12-18 MED ORDER — ALBUMIN HUMAN 5 % IV SOLN
INTRAVENOUS | Status: DC | PRN
  Administered 2014-12-18: 18:00:00 via INTRAVENOUS

## 2014-12-18 MED ORDER — SODIUM CHLORIDE 0.45 % IV SOLN
INTRAVENOUS | Status: DC
  Administered 2014-12-18: 20:00:00 via INTRAVENOUS

## 2014-12-18 MED ORDER — LACTATED RINGERS IV SOLN
INTRAVENOUS | Status: DC | PRN
  Administered 2014-12-18 (×2): via INTRAVENOUS

## 2014-12-18 MED ORDER — LACTATED RINGERS IV SOLN: 500.0000 mL | Freq: Once | INTRAVENOUS | Status: AC | PRN

## 2014-12-18 MED ORDER — VECURONIUM BROMIDE 10 MG IV SOLR
INTRAVENOUS | Status: AC
  Filled 2014-12-18: qty 20

## 2014-12-18 MED ORDER — SODIUM CHLORIDE 0.9 % IV SOLN: 250.0000 mL | INTRAVENOUS | Status: DC | PRN

## 2014-12-18 MED ORDER — PROTAMINE SULFATE 10 MG/ML IV SOLN
INTRAVENOUS | Status: AC
  Filled 2014-12-18: qty 10

## 2014-12-18 MED ORDER — MORPHINE SULFATE 2 MG/ML IJ SOLN
1.0000 mg | INTRAMUSCULAR | Status: AC | PRN
  Administered 2014-12-18: 2 mg via INTRAVENOUS
  Filled 2014-12-18: qty 1

## 2014-12-18 MED ORDER — METOPROLOL TARTRATE 1 MG/ML IV SOLN: 2.5000 mg | INTRAVENOUS | Status: DC | PRN

## 2014-12-18 MED ORDER — ROCURONIUM BROMIDE 50 MG/5ML IV SOLN
INTRAVENOUS | Status: AC
  Filled 2014-12-18: qty 2

## 2014-12-18 MED ORDER — MORPHINE SULFATE 2 MG/ML IJ SOLN
2.0000 mg | INTRAMUSCULAR | Status: AC | PRN
  Administered 2014-12-19: 4 mg via INTRAVENOUS
  Administered 2014-12-20: 2 mg via INTRAVENOUS
  Administered 2014-12-20: 4 mg via INTRAVENOUS
  Filled 2014-12-18: qty 2
  Filled 2014-12-18: qty 1
  Filled 2014-12-18: qty 2

## 2014-12-18 MED ORDER — DEXTROSE 5 % IV SOLN
1.5000 g | INTRAVENOUS | Status: AC
  Administered 2014-12-18: 1.5 g via INTRAVENOUS
  Administered 2014-12-18: .75 g via INTRAVENOUS
  Filled 2014-12-18 (×2): qty 1.5

## 2014-12-18 MED ORDER — CETYLPYRIDINIUM CHLORIDE 0.05 % MT LIQD
7.0000 mL | Freq: Four times a day (QID) | OROMUCOSAL | Status: DC
  Administered 2014-12-19 (×2): 7 mL via OROMUCOSAL

## 2014-12-18 MED ORDER — LIDOCAINE HCL (CARDIAC) 20 MG/ML IV SOLN
INTRAVENOUS | Status: AC
  Filled 2014-12-18: qty 5

## 2014-12-18 MED ORDER — BISACODYL 5 MG PO TBEC: 5.0000 mg | DELAYED_RELEASE_TABLET | Freq: Once | ORAL | Status: DC

## 2014-12-18 MED ORDER — ARTIFICIAL TEARS OP OINT
TOPICAL_OINTMENT | OPHTHALMIC | Status: AC
  Filled 2014-12-18: qty 3.5

## 2014-12-18 MED ORDER — SODIUM CHLORIDE 0.9 % IJ SOLN: 3.0000 mL | Freq: Two times a day (BID) | INTRAMUSCULAR | Status: DC

## 2014-12-18 MED ORDER — CHLORHEXIDINE GLUCONATE CLOTH 2 % EX PADS
6.0000 | MEDICATED_PAD | Freq: Once | CUTANEOUS | Status: AC
  Administered 2014-12-18: 6 via TOPICAL

## 2014-12-18 MED ORDER — HEPARIN SODIUM (PORCINE) 1000 UNIT/ML IJ SOLN
INTRAMUSCULAR | Status: AC
  Filled 2014-12-18: qty 1

## 2014-12-18 MED ORDER — PLASMA-LYTE 148 IV SOLN
INTRAVENOUS | Status: AC
  Administered 2014-12-18: 500 mL
  Filled 2014-12-18: qty 2.5

## 2014-12-18 MED ORDER — NITROGLYCERIN IN D5W 200-5 MCG/ML-% IV SOLN
2.0000 ug/min | INTRAVENOUS | Status: DC
  Filled 2014-12-18: qty 250

## 2014-12-18 MED ORDER — DOCUSATE SODIUM 100 MG PO CAPS
200.0000 mg | ORAL_CAPSULE | Freq: Every day | ORAL | Status: DC
  Administered 2014-12-19 – 2014-12-22 (×4): 200 mg via ORAL
  Filled 2014-12-18 (×5): qty 2

## 2014-12-18 MED ORDER — TRAMADOL HCL 50 MG PO TABS
50.0000 mg | ORAL_TABLET | ORAL | Status: DC | PRN
  Administered 2014-12-19: 100 mg via ORAL
  Filled 2014-12-18: qty 2

## 2014-12-18 MED ORDER — ACETAMINOPHEN 160 MG/5ML PO SOLN
1000.0000 mg | Freq: Four times a day (QID) | ORAL | Status: DC
  Filled 2014-12-18: qty 40

## 2014-12-18 MED ORDER — DIAZEPAM 5 MG PO TABS: 5.0000 mg | ORAL_TABLET | ORAL | Status: DC | PRN

## 2014-12-18 MED ORDER — MIDAZOLAM HCL 2 MG/2ML IJ SOLN
INTRAMUSCULAR | Status: AC
  Filled 2014-12-18: qty 2

## 2014-12-18 MED ORDER — ASPIRIN 81 MG PO CHEW: 324.0000 mg | CHEWABLE_TABLET | Freq: Every day | ORAL | Status: DC

## 2014-12-18 MED ORDER — TEMAZEPAM 15 MG PO CAPS: 15.0000 mg | ORAL_CAPSULE | Freq: Once | ORAL | Status: DC | PRN

## 2014-12-18 MED ORDER — GLYCOPYRROLATE 0.2 MG/ML IJ SOLN
INTRAMUSCULAR | Status: DC | PRN
  Administered 2014-12-18 (×2): 0.2 mg via INTRAVENOUS

## 2014-12-18 MED ORDER — ASPIRIN 81 MG PO CHEW
81.0000 mg | CHEWABLE_TABLET | ORAL | Status: AC
  Administered 2014-12-18: 81 mg via ORAL
  Filled 2014-12-18: qty 1

## 2014-12-18 MED ORDER — POTASSIUM CHLORIDE 10 MEQ/50ML IV SOLN
10.0000 meq | INTRAVENOUS | Status: AC
  Administered 2014-12-18 (×3): 10 meq via INTRAVENOUS
  Filled 2014-12-18: qty 50

## 2014-12-18 MED ORDER — METOPROLOL TARTRATE 12.5 MG HALF TABLET
12.5000 mg | ORAL_TABLET | Freq: Once | ORAL | Status: AC
  Administered 2014-12-18: 12.5 mg via ORAL
  Filled 2014-12-18: qty 1

## 2014-12-18 MED ORDER — SUCCINYLCHOLINE CHLORIDE 20 MG/ML IJ SOLN
INTRAMUSCULAR | Status: AC
Start: 2014-12-18 — End: 2014-12-18
  Filled 2014-12-18: qty 1

## 2014-12-18 MED ORDER — MILRINONE LOAD VIA INFUSION
INTRAVENOUS | Status: DC | PRN
  Administered 2014-12-18: 5100 ug via INTRAVENOUS

## 2014-12-18 MED ORDER — BISACODYL 5 MG PO TBEC
10.0000 mg | DELAYED_RELEASE_TABLET | Freq: Every day | ORAL | Status: DC
  Administered 2014-12-19 – 2014-12-22 (×4): 10 mg via ORAL
  Filled 2014-12-18 (×5): qty 2

## 2014-12-18 MED ORDER — FENTANYL CITRATE 0.05 MG/ML IJ SOLN
INTRAMUSCULAR | Status: AC
  Filled 2014-12-18: qty 5

## 2014-12-18 MED ORDER — HEPARIN SODIUM (PORCINE) 1000 UNIT/ML IJ SOLN
INTRAMUSCULAR | Status: DC | PRN
  Administered 2014-12-18: 21000 [IU] via INTRAVENOUS
  Administered 2014-12-18 (×3): 2000 [IU] via INTRAVENOUS

## 2014-12-18 MED ORDER — EPINEPHRINE HCL 1 MG/ML IJ SOLN
0.0000 ug/min | INTRAVENOUS | Status: DC
  Filled 2014-12-18: qty 4

## 2014-12-18 MED ORDER — MIDAZOLAM HCL 2 MG/2ML IJ SOLN: 2.0000 mg | INTRAMUSCULAR | Status: AC | PRN

## 2014-12-18 MED ORDER — ASPIRIN EC 325 MG PO TBEC
325.0000 mg | DELAYED_RELEASE_TABLET | Freq: Every day | ORAL | Status: DC
  Administered 2014-12-19 – 2014-12-23 (×5): 325 mg via ORAL
  Filled 2014-12-18 (×5): qty 1

## 2014-12-18 MED ORDER — PANTOPRAZOLE SODIUM 40 MG PO TBEC
40.0000 mg | DELAYED_RELEASE_TABLET | Freq: Every day | ORAL | Status: DC
  Administered 2014-12-20 – 2014-12-23 (×4): 40 mg via ORAL
  Filled 2014-12-18 (×4): qty 1

## 2014-12-18 MED ORDER — METOPROLOL TARTRATE 12.5 MG HALF TABLET
12.5000 mg | ORAL_TABLET | Freq: Two times a day (BID) | ORAL | Status: DC
  Filled 2014-12-18 (×3): qty 1

## 2014-12-18 MED ORDER — FENTANYL CITRATE 0.05 MG/ML IJ SOLN
INTRAMUSCULAR | Status: AC
  Filled 2014-12-18: qty 2

## 2014-12-18 MED ORDER — CHLORHEXIDINE GLUCONATE 0.12 % MT SOLN
15.0000 mL | Freq: Two times a day (BID) | OROMUCOSAL | Status: DC
Start: 2014-12-18 — End: 2014-12-19
  Administered 2014-12-18: 15 mL via OROMUCOSAL

## 2014-12-18 MED ORDER — SODIUM CHLORIDE 0.9 % IV SOLN
INTRAVENOUS | Status: DC
  Administered 2014-12-18: 20:00:00 via INTRAVENOUS

## 2014-12-18 MED ORDER — LACTATED RINGERS IV SOLN
INTRAVENOUS | Status: DC | PRN
  Administered 2014-12-18: 12:00:00 via INTRAVENOUS

## 2014-12-18 MED ORDER — SODIUM CHLORIDE 0.9 % IV SOLN
INTRAVENOUS | Status: DC
  Administered 2014-12-18: 75 mL/h via INTRAVENOUS

## 2014-12-18 MED ORDER — SODIUM CHLORIDE 0.9 % IJ SOLN
3.0000 mL | Freq: Two times a day (BID) | INTRAMUSCULAR | Status: DC
  Administered 2014-12-19 – 2014-12-20 (×3): 3 mL via INTRAVENOUS

## 2014-12-18 MED ORDER — ARTIFICIAL TEARS OP OINT
TOPICAL_OINTMENT | OPHTHALMIC | Status: DC | PRN
  Administered 2014-12-18: 1 via OPHTHALMIC

## 2014-12-18 MED ORDER — NITROGLYCERIN IN D5W 200-5 MCG/ML-% IV SOLN: 0.0000 ug/min | INTRAVENOUS | Status: DC

## 2014-12-18 MED ORDER — PAPAVERINE HCL 30 MG/ML IJ SOLN
INTRAMUSCULAR | Status: AC
  Administered 2014-12-18: 500 mL
  Filled 2014-12-18: qty 2.5

## 2014-12-18 MED ORDER — FAMOTIDINE IN NACL 20-0.9 MG/50ML-% IV SOLN
20.0000 mg | Freq: Two times a day (BID) | INTRAVENOUS | Status: AC
  Administered 2014-12-18 – 2014-12-19 (×2): 20 mg via INTRAVENOUS
  Filled 2014-12-18: qty 50

## 2014-12-18 MED ORDER — PHENYLEPHRINE HCL 10 MG/ML IJ SOLN
0.0000 ug/min | INTRAVENOUS | Status: DC
  Administered 2014-12-18: 30 ug/min via INTRAVENOUS
  Filled 2014-12-18: qty 2

## 2014-12-18 MED ORDER — SODIUM CHLORIDE 0.9 % IV SOLN
1.0000 mL/kg/h | INTRAVENOUS | Status: DC
  Administered 2014-12-18: 1 mL/kg/h via INTRAVENOUS

## 2014-12-18 MED ORDER — SODIUM CHLORIDE 0.9 % IJ SOLN
3.0000 mL | INTRAMUSCULAR | Status: DC | PRN
  Administered 2014-12-20: 3 mL via INTRAVENOUS
  Filled 2014-12-18: qty 3

## 2014-12-18 MED ORDER — HEMOSTATIC AGENTS (NO CHARGE) OPTIME
TOPICAL | Status: DC | PRN
  Administered 2014-12-18 (×2): 1 via TOPICAL

## 2014-12-18 MED ORDER — VANCOMYCIN HCL IN DEXTROSE 1-5 GM/200ML-% IV SOLN
1000.0000 mg | Freq: Once | INTRAVENOUS | Status: AC
  Administered 2014-12-19: 1000 mg via INTRAVENOUS
  Filled 2014-12-18: qty 200

## 2014-12-18 MED ORDER — ONDANSETRON HCL 4 MG/2ML IJ SOLN
4.0000 mg | Freq: Four times a day (QID) | INTRAMUSCULAR | Status: DC | PRN
  Administered 2014-12-19: 4 mg via INTRAVENOUS
  Filled 2014-12-18: qty 2

## 2014-12-18 MED ORDER — PROTAMINE SULFATE 10 MG/ML IV SOLN
INTRAVENOUS | Status: DC | PRN
  Administered 2014-12-18: 270 mg via INTRAVENOUS

## 2014-12-18 MED ORDER — PROPOFOL 10 MG/ML IV BOLUS
INTRAVENOUS | Status: DC | PRN
  Administered 2014-12-18: 50 mg via INTRAVENOUS

## 2014-12-18 MED ORDER — VERAPAMIL HCL 2.5 MG/ML IV SOLN
INTRAVENOUS | Status: AC
  Filled 2014-12-18: qty 2

## 2014-12-18 MED ORDER — MIDAZOLAM HCL 10 MG/2ML IJ SOLN
INTRAMUSCULAR | Status: AC
  Filled 2014-12-18: qty 2

## 2014-12-18 MED ORDER — LIDOCAINE HCL (PF) 1 % IJ SOLN
INTRAMUSCULAR | Status: AC
  Filled 2014-12-18: qty 30

## 2014-12-18 MED ORDER — LIDOCAINE HCL (CARDIAC) 20 MG/ML IV SOLN
INTRAVENOUS | Status: DC | PRN
  Administered 2014-12-18: 100 mg via INTRAVENOUS

## 2014-12-18 MED ORDER — KETOROLAC TROMETHAMINE 15 MG/ML IJ SOLN: 15.0000 mg | Freq: Once | INTRAMUSCULAR | Status: AC | PRN

## 2014-12-18 MED ORDER — ROCURONIUM BROMIDE 50 MG/5ML IV SOLN
INTRAVENOUS | Status: AC
  Filled 2014-12-18: qty 1

## 2014-12-18 MED ORDER — METOPROLOL TARTRATE 25 MG/10 ML ORAL SUSPENSION
12.5000 mg | Freq: Two times a day (BID) | ORAL | Status: DC
  Filled 2014-12-18 (×3): qty 5

## 2014-12-18 MED ORDER — DEXTROSE 5 % IV SOLN
1.5000 g | Freq: Two times a day (BID) | INTRAVENOUS | Status: AC
  Administered 2014-12-19 – 2014-12-20 (×4): 1.5 g via INTRAVENOUS
  Filled 2014-12-18 (×4): qty 1.5

## 2014-12-18 MED ORDER — ACETAMINOPHEN 500 MG PO TABS
1000.0000 mg | ORAL_TABLET | Freq: Four times a day (QID) | ORAL | Status: DC
  Administered 2014-12-19 – 2014-12-23 (×14): 1000 mg via ORAL
  Filled 2014-12-18 (×18): qty 2

## 2014-12-18 MED ORDER — PHENYLEPHRINE 40 MCG/ML (10ML) SYRINGE FOR IV PUSH (FOR BLOOD PRESSURE SUPPORT)
PREFILLED_SYRINGE | INTRAVENOUS | Status: AC
  Filled 2014-12-18: qty 10

## 2014-12-18 MED ORDER — MAGNESIUM SULFATE 4 GM/100ML IV SOLN
4.0000 g | Freq: Once | INTRAVENOUS | Status: AC
  Administered 2014-12-18: 4 g via INTRAVENOUS
  Filled 2014-12-18: qty 100

## 2014-12-18 MED ORDER — PROPOFOL 10 MG/ML IV BOLUS
INTRAVENOUS | Status: AC
  Filled 2014-12-18: qty 20

## 2014-12-18 MED ORDER — PHENYLEPHRINE HCL 10 MG/ML IJ SOLN
30.0000 ug/min | INTRAVENOUS | Status: AC
  Administered 2014-12-18: 10 ug/min via INTRAVENOUS
  Filled 2014-12-18: qty 2

## 2014-12-18 MED ORDER — SODIUM CHLORIDE 0.9 % IV SOLN
INTRAVENOUS | Status: AC
  Administered 2014-12-18: 17:00:00 via INTRAVENOUS
  Administered 2014-12-18: 69.8 mL/h via INTRAVENOUS
  Filled 2014-12-18 (×2): qty 40

## 2014-12-18 MED ORDER — ROCURONIUM BROMIDE 100 MG/10ML IV SOLN
INTRAVENOUS | Status: DC | PRN
  Administered 2014-12-18 (×5): 50 mg via INTRAVENOUS

## 2014-12-18 MED ORDER — SODIUM CHLORIDE 0.9 % IV SOLN: 250.0000 mL | INTRAVENOUS | Status: DC

## 2014-12-18 MED ORDER — INSULIN REGULAR BOLUS VIA INFUSION
0.0000 [IU] | Freq: Three times a day (TID) | INTRAVENOUS | Status: DC
  Filled 2014-12-18: qty 10

## 2014-12-18 MED ORDER — DOPAMINE-DEXTROSE 3.2-5 MG/ML-% IV SOLN
0.0000 ug/kg/min | INTRAVENOUS | Status: AC
  Administered 2014-12-18: 2 ug/kg/min via INTRAVENOUS
  Filled 2014-12-18: qty 250

## 2014-12-18 MED ORDER — VANCOMYCIN HCL 10 G IV SOLR
1500.0000 mg | INTRAVENOUS | Status: AC
  Administered 2014-12-18: 1500 mg via INTRAVENOUS
  Filled 2014-12-18: qty 1500

## 2014-12-18 MED ORDER — CALCIUM CHLORIDE 10 % IV SOLN
INTRAVENOUS | Status: DC | PRN
  Administered 2014-12-18: 100 mg via INTRAVENOUS

## 2014-12-18 MED ORDER — 0.9 % SODIUM CHLORIDE (POUR BTL) OPTIME
TOPICAL | Status: DC | PRN
  Administered 2014-12-18: 5000 mL

## 2014-12-18 MED ORDER — SODIUM CHLORIDE 0.9 % IJ SOLN: 3.0000 mL | INTRAMUSCULAR | Status: DC | PRN

## 2014-12-18 MED ORDER — MAGNESIUM SULFATE 50 % IJ SOLN
40.0000 meq | INTRAMUSCULAR | Status: DC
  Filled 2014-12-18: qty 10

## 2014-12-18 MED ORDER — ACETAMINOPHEN 650 MG RE SUPP
650.0000 mg | Freq: Once | RECTAL | Status: AC
  Administered 2014-12-18: 650 mg via RECTAL

## 2014-12-18 MED ORDER — MIDAZOLAM HCL 5 MG/5ML IJ SOLN
INTRAMUSCULAR | Status: DC | PRN
  Administered 2014-12-18: 3 mg via INTRAVENOUS
  Administered 2014-12-18: 4 mg via INTRAVENOUS
  Administered 2014-12-18: 1 mg via INTRAVENOUS
  Administered 2014-12-18 (×2): 2 mg via INTRAVENOUS

## 2014-12-18 MED ORDER — DEXMEDETOMIDINE HCL IN NACL 400 MCG/100ML IV SOLN
0.1000 ug/kg/h | INTRAVENOUS | Status: AC
  Administered 2014-12-18: 0.2 ug/kg/h via INTRAVENOUS
  Filled 2014-12-18: qty 100

## 2014-12-18 MED ORDER — MILRINONE IN DEXTROSE 20 MG/100ML IV SOLN
0.1250 ug/kg/min | INTRAVENOUS | Status: AC
  Administered 2014-12-18: 0.25 ug/kg/min via INTRAVENOUS
  Filled 2014-12-18: qty 100

## 2014-12-18 MED ORDER — HEPARIN (PORCINE) IN NACL 2-0.9 UNIT/ML-% IJ SOLN
INTRAMUSCULAR | Status: AC
  Filled 2014-12-18: qty 1000

## 2014-12-18 MED ORDER — DEXMEDETOMIDINE HCL IN NACL 200 MCG/50ML IV SOLN
0.0000 ug/kg/h | INTRAVENOUS | Status: DC
  Administered 2014-12-18: 0.5 ug/kg/h via INTRAVENOUS
  Filled 2014-12-18: qty 50

## 2014-12-18 MED ORDER — SODIUM CHLORIDE 0.9 % IJ SOLN
INTRAMUSCULAR | Status: AC
  Filled 2014-12-18: qty 30

## 2014-12-18 MED ORDER — GLYCOPYRROLATE 0.2 MG/ML IJ SOLN
INTRAMUSCULAR | Status: AC
  Filled 2014-12-18: qty 2

## 2014-12-18 MED ORDER — SODIUM CHLORIDE 0.9 % IV SOLN
INTRAVENOUS | Status: DC
  Administered 2014-12-18: 2.2 [IU]/h via INTRAVENOUS
  Filled 2014-12-18: qty 2.5

## 2014-12-18 MED ORDER — OXYCODONE HCL 5 MG PO TABS
5.0000 mg | ORAL_TABLET | ORAL | Status: DC | PRN
  Administered 2014-12-19: 10 mg via ORAL
  Administered 2014-12-19: 5 mg via ORAL
  Administered 2014-12-19 – 2014-12-20 (×4): 10 mg via ORAL
  Filled 2014-12-18: qty 1
  Filled 2014-12-18 (×5): qty 2

## 2014-12-18 MED ORDER — BISACODYL 10 MG RE SUPP: 10.0000 mg | Freq: Every day | RECTAL | Status: DC

## 2014-12-18 MED ORDER — INSULIN REGULAR HUMAN 100 UNIT/ML IJ SOLN
INTRAMUSCULAR | Status: AC
  Administered 2014-12-18: 1 [IU]/h via INTRAVENOUS
  Filled 2014-12-18: qty 2.5

## 2014-12-18 MED ORDER — NITROGLYCERIN 1 MG/10 ML FOR IR/CATH LAB
INTRA_ARTERIAL | Status: AC
  Filled 2014-12-18: qty 10

## 2014-12-18 MED ORDER — ALBUMIN HUMAN 5 % IV SOLN
250.0000 mL | INTRAVENOUS | Status: AC | PRN
  Administered 2014-12-19 (×2): 250 mL via INTRAVENOUS

## 2014-12-18 MED ORDER — POTASSIUM CHLORIDE 2 MEQ/ML IV SOLN
80.0000 meq | INTRAVENOUS | Status: DC
  Filled 2014-12-18: qty 40

## 2014-12-18 MED ORDER — FENTANYL CITRATE 0.05 MG/ML IJ SOLN
INTRAMUSCULAR | Status: DC | PRN
  Administered 2014-12-18 (×2): 50 ug via INTRAVENOUS
  Administered 2014-12-18: 100 ug via INTRAVENOUS
  Administered 2014-12-18: 700 ug via INTRAVENOUS
  Administered 2014-12-18: 200 ug via INTRAVENOUS

## 2014-12-18 MED ORDER — EPHEDRINE SULFATE 50 MG/ML IJ SOLN
INTRAMUSCULAR | Status: AC
  Filled 2014-12-18: qty 1

## 2014-12-18 MED ORDER — SODIUM CHLORIDE 0.9 % IV SOLN
INTRAVENOUS | Status: DC
  Filled 2014-12-18: qty 30

## 2014-12-18 MED ORDER — GELATIN ABSORBABLE MT POWD
OROMUCOSAL | Status: DC | PRN
  Administered 2014-12-18: 4 mL via TOPICAL

## 2014-12-18 MED ORDER — ACETAMINOPHEN 160 MG/5ML PO SOLN
650.0000 mg | Freq: Once | ORAL | Status: AC
Start: 2014-12-18 — End: 2014-12-18

## 2014-12-18 SURGICAL SUPPLY — 116 items
ADAPTER CARDIO PERF ANTE/RETRO (ADAPTER) ×5 IMPLANT
APPLIER CLIP 9.375 SM OPEN (CLIP) ×5
ATTRACTOMAT 16X20 MAGNETIC DRP (DRAPES) IMPLANT
BAG DECANTER FOR FLEXI CONT (MISCELLANEOUS) ×5 IMPLANT
BANDAGE ELASTIC 4 VELCRO ST LF (GAUZE/BANDAGES/DRESSINGS) ×5 IMPLANT
BANDAGE ELASTIC 6 VELCRO ST LF (GAUZE/BANDAGES/DRESSINGS) ×5 IMPLANT
BASKET HEART  (ORDER IN 25'S) (MISCELLANEOUS) ×1
BASKET HEART (ORDER IN 25'S) (MISCELLANEOUS) ×1
BASKET HEART (ORDER IN 25S) (MISCELLANEOUS) ×3 IMPLANT
BENZOIN TINCTURE PRP APPL 2/3 (GAUZE/BANDAGES/DRESSINGS) ×5 IMPLANT
BLADE STERNUM SYSTEM 6 (BLADE) ×5 IMPLANT
BLADE SURG 11 STRL SS (BLADE) ×5 IMPLANT
BLADE SURG 12 STRL SS (BLADE) ×5 IMPLANT
BLADE SURG 15 STRL LF DISP TIS (BLADE) ×3 IMPLANT
BLADE SURG 15 STRL SS (BLADE) ×2
BLADE SURG ROTATE 9660 (MISCELLANEOUS) ×5 IMPLANT
BNDG GAUZE ELAST 4 BULKY (GAUZE/BANDAGES/DRESSINGS) ×5 IMPLANT
CANISTER SUCTION 2500CC (MISCELLANEOUS) ×5 IMPLANT
CANNULA GUNDRY RCSP 15FR (MISCELLANEOUS) ×5 IMPLANT
CANNULA VESSEL 3MM 2 BLNT TIP (CANNULA) ×5 IMPLANT
CANNULA VESSEL 3MM BLUNT TIP (CANNULA) ×5 IMPLANT
CATH CPB KIT VANTRIGT (MISCELLANEOUS) ×5 IMPLANT
CATH ROBINSON RED A/P 18FR (CATHETERS) ×15 IMPLANT
CATH THORACIC 36FR RT ANG (CATHETERS) ×5 IMPLANT
CLIP APPLIE 9.375 SM OPEN (CLIP) ×3 IMPLANT
CLIP FOGARTY SPRING 6M (CLIP) ×10 IMPLANT
CLIP RETRACTION 3.0MM CORONARY (MISCELLANEOUS) ×5 IMPLANT
CLIP TI MEDIUM 24 (CLIP) IMPLANT
CLIP TI WIDE RED SMALL 24 (CLIP) ×20 IMPLANT
CLOSURE STERI-STRIP 1/2X4 (GAUZE/BANDAGES/DRESSINGS) ×1
CLSR STERI-STRIP ANTIMIC 1/2X4 (GAUZE/BANDAGES/DRESSINGS) ×4 IMPLANT
COVER MAYO STAND STRL (DRAPES) ×10 IMPLANT
COVER SURGICAL LIGHT HANDLE (MISCELLANEOUS) ×5 IMPLANT
CRADLE DONUT ADULT HEAD (MISCELLANEOUS) ×5 IMPLANT
DRAIN CHANNEL 32F RND 10.7 FF (WOUND CARE) ×5 IMPLANT
DRAPE CARDIOVASCULAR INCISE (DRAPES) ×2
DRAPE EXTREMITY T 121X128X90 (DRAPE) ×5 IMPLANT
DRAPE PROXIMA HALF (DRAPES) IMPLANT
DRAPE SLUSH/WARMER DISC (DRAPES) ×5 IMPLANT
DRAPE SRG 135X102X78XABS (DRAPES) ×3 IMPLANT
DRSG AQUACEL AG ADV 3.5X14 (GAUZE/BANDAGES/DRESSINGS) ×5 IMPLANT
ELECT BLADE 4.0 EZ CLEAN MEGAD (MISCELLANEOUS) ×5
ELECT BLADE 6.5 EXT (BLADE) ×5 IMPLANT
ELECT CAUTERY BLADE 6.4 (BLADE) ×5 IMPLANT
ELECT REM PT RETURN 9FT ADLT (ELECTROSURGICAL) ×10
ELECTRODE BLDE 4.0 EZ CLN MEGD (MISCELLANEOUS) ×3 IMPLANT
ELECTRODE REM PT RTRN 9FT ADLT (ELECTROSURGICAL) ×6 IMPLANT
GAUZE SPONGE 4X4 12PLY STRL (GAUZE/BANDAGES/DRESSINGS) ×10 IMPLANT
GEL ULTRASOUND 20GR AQUASONIC (MISCELLANEOUS) ×5 IMPLANT
GLOVE BIO SURGEON STRL SZ 6 (GLOVE) ×20 IMPLANT
GLOVE BIO SURGEON STRL SZ 6.5 (GLOVE) ×8 IMPLANT
GLOVE BIO SURGEON STRL SZ7 (GLOVE) ×10 IMPLANT
GLOVE BIO SURGEON STRL SZ7.5 (GLOVE) ×15 IMPLANT
GLOVE BIO SURGEONS STRL SZ 6.5 (GLOVE) ×2
GOWN STRL REUS W/ TWL LRG LVL3 (GOWN DISPOSABLE) ×30 IMPLANT
GOWN STRL REUS W/TWL LRG LVL3 (GOWN DISPOSABLE) ×20
HARMONIC SHEARS 14CM COAG (MISCELLANEOUS) IMPLANT
HEMOSTAT POWDER SURGIFOAM 1G (HEMOSTASIS) ×15 IMPLANT
HEMOSTAT SURGICEL 2X14 (HEMOSTASIS) ×5 IMPLANT
INSERT FOGARTY XLG (MISCELLANEOUS) IMPLANT
KIT BASIN OR (CUSTOM PROCEDURE TRAY) ×5 IMPLANT
KIT ROOM TURNOVER OR (KITS) ×5 IMPLANT
KIT SUCTION CATH 14FR (SUCTIONS) ×5 IMPLANT
KIT VASOVIEW W/TROCAR VH 2000 (KITS) ×5 IMPLANT
LEAD PACING MYOCARDI (MISCELLANEOUS) ×5 IMPLANT
MARKER GRAFT CORONARY BYPASS (MISCELLANEOUS) ×20 IMPLANT
NS IRRIG 1000ML POUR BTL (IV SOLUTION) ×30 IMPLANT
PACK OPEN HEART (CUSTOM PROCEDURE TRAY) ×5 IMPLANT
PAD ARMBOARD 7.5X6 YLW CONV (MISCELLANEOUS) ×10 IMPLANT
PAD ELECT DEFIB RADIOL ZOLL (MISCELLANEOUS) ×5 IMPLANT
PENCIL BUTTON HOLSTER BLD 10FT (ELECTRODE) ×5 IMPLANT
PUNCH AORTIC ROTATE  4.5MM 8IN (MISCELLANEOUS) ×5 IMPLANT
PUNCH AORTIC ROTATE 4.0MM (MISCELLANEOUS) IMPLANT
PUNCH AORTIC ROTATE 4.5MM 8IN (MISCELLANEOUS) IMPLANT
PUNCH AORTIC ROTATE 5MM 8IN (MISCELLANEOUS) IMPLANT
SET CARDIOPLEGIA MPS 5001102 (MISCELLANEOUS) ×5 IMPLANT
SPONGE LAP 18X18 X RAY DECT (DISPOSABLE) ×5 IMPLANT
SPONGE LAP 4X18 X RAY DECT (DISPOSABLE) ×5 IMPLANT
STOPCOCK MORSE 400PSI 3WAY (MISCELLANEOUS) ×5 IMPLANT
SURGIFLO W/THROMBIN 8M KIT (HEMOSTASIS) ×10 IMPLANT
SUT BONE WAX W31G (SUTURE) ×5 IMPLANT
SUT MNCRL AB 4-0 PS2 18 (SUTURE) ×5 IMPLANT
SUT PROLENE 3 0 SH DA (SUTURE) IMPLANT
SUT PROLENE 3 0 SH1 36 (SUTURE) IMPLANT
SUT PROLENE 4 0 RB 1 (SUTURE) ×4
SUT PROLENE 4 0 SH DA (SUTURE) ×5 IMPLANT
SUT PROLENE 4-0 RB1 .5 CRCL 36 (SUTURE) ×6 IMPLANT
SUT PROLENE 5 0 C 1 36 (SUTURE) IMPLANT
SUT PROLENE 6 0 C 1 30 (SUTURE) ×20 IMPLANT
SUT PROLENE 6 0 CC (SUTURE) ×25 IMPLANT
SUT PROLENE 8 0 BV175 6 (SUTURE) ×10 IMPLANT
SUT PROLENE BLUE 7 0 (SUTURE) ×15 IMPLANT
SUT SILK  1 MH (SUTURE) ×4
SUT SILK 1 MH (SUTURE) ×6 IMPLANT
SUT SILK 2 0 SH CR/8 (SUTURE) ×5 IMPLANT
SUT SILK 3 0 SH CR/8 (SUTURE) ×5 IMPLANT
SUT STEEL 6MS V (SUTURE) ×10 IMPLANT
SUT STEEL SZ 6 DBL 3X14 BALL (SUTURE) ×5 IMPLANT
SUT VIC AB 1 CTX 36 (SUTURE) ×6
SUT VIC AB 1 CTX36XBRD ANBCTR (SUTURE) ×9 IMPLANT
SUT VIC AB 2-0 CT1 27 (SUTURE) ×2
SUT VIC AB 2-0 CT1 TAPERPNT 27 (SUTURE) ×3 IMPLANT
SUT VIC AB 2-0 CTX 27 (SUTURE) IMPLANT
SUT VIC AB 3-0 SH 27 (SUTURE)
SUT VIC AB 3-0 SH 27X BRD (SUTURE) IMPLANT
SUT VIC AB 3-0 X1 27 (SUTURE) IMPLANT
SUTURE E-PAK OPEN HEART (SUTURE) ×5 IMPLANT
SYR 50ML SLIP (SYRINGE) IMPLANT
SYSTEM SAHARA CHEST DRAIN ATS (WOUND CARE) ×5 IMPLANT
TAPE CLOTH SURG 4X10 WHT LF (GAUZE/BANDAGES/DRESSINGS) ×5 IMPLANT
TOWEL OR 17X24 6PK STRL BLUE (TOWEL DISPOSABLE) ×10 IMPLANT
TOWEL OR 17X26 10 PK STRL BLUE (TOWEL DISPOSABLE) ×10 IMPLANT
TRAY FOLEY IC TEMP SENS 16FR (CATHETERS) ×5 IMPLANT
TUBING INSUFFLATION (TUBING) ×5 IMPLANT
UNDERPAD 30X30 INCONTINENT (UNDERPADS AND DIAPERS) ×5 IMPLANT
WATER STERILE IRR 1000ML POUR (IV SOLUTION) ×10 IMPLANT

## 2014-12-18 NOTE — Transfer of Care (Signed)
Immediate Anesthesia Transfer of Care Note  Patient: Hunter Gomez  Procedure(s) Performed: Procedure(s) with comments: CORONARY ARTERY BYPASS GRAFTING (CABG), ON PUMP, TIMES FIVE, USING BILATERAL INTERNAL MAMMARY ARTERIES, RIGHT GREATER SAPHENOUS VEIN HARVESTED ENDOSCOPICALLY (N/A) - LIMA-LAD, SVG-D, SVG-OM, Free RIMA-Ramus, SVG-PD INTRAOPERATIVE TRANSESOPHAGEAL ECHOCARDIOGRAM (N/A)  Patient Location: ICU  Anesthesia Type:General  Level of Consciousness: sedated and Patient remains intubated per anesthesia plan  Airway & Oxygen Therapy: Patient remains intubated per anesthesia plan and Patient placed on Ventilator (see vital sign flow sheet for setting)  Post-op Assessment: Report given to PACU RN and Post -op Vital signs reviewed and stable  Post vital signs: Reviewed and stable  Complications: No apparent anesthesia complications

## 2014-12-18 NOTE — Progress Notes (Signed)
Pre-op Cardiac Surgery  Carotid Findings:  Bilateral:  1-39% ICA stenosis.  Vertebral artery flow is antegrade.      Upper Extremity Right Left  Brachial Pressures N/A- recent radial cath 134  Radial Waveforms Tri Tri  Ulnar Waveforms Tri Tri  Palmar Arch (Allen's Test) N/A Obliterates with radial compression, normal with ulnar compresion   Findings:  Palpable pedal pulses.   Farrel DemarkJill Eunice, RDMS, RVT 12/18/2014

## 2014-12-18 NOTE — H&P (View-Only) (Signed)
Patient ID: Rinaldo CloudBradley Coatney, male   DOB: 1971/02/04, 44 y.o.   MRN: [id number] Patient ID: Rinaldo CloudBradley Czerwinski, male   DOB: 1971/02/04, 44 y.o.   MRN: [id number]    Subjective:  Denies SSCP, palpitations or Dyspnea   Objective:  Filed Vitals:   12/16/14 2300 12/17/14 0000 12/17/14 0412 12/17/14 0759  BP: 108/58  101/57 112/66  Pulse: 67 66 62 64  Temp: 97.8 F (36.6 C)  97.5 F (36.4 C) 98 F (36.7 C)  TempSrc: Oral  Oral Oral  Resp: 16 15 11 14   Height:      Weight:      SpO2: 99% 96% 100% 98%    Intake/Output from previous day:  Intake/Output Summary (Last 24 hours) at 12/17/14 1000 Last data filed at 12/17/14 0800  Gross per 24 hour  Intake 999.78 ml  Output   1525 ml  Net -525.22 ml    Physical Exam: Affect appropriate Healthy:  appears stated age HEENT: normal Neck supple with no adenopathy JVP normal no bruits no thyromegaly Lungs clear with no wheezing and good diaphragmatic motion Heart:  S1/S2 no murmur, no rub, gallop or click PMI normal Abdomen: benign previous lapband no bruit.  No HSM or HJR Distal pulses intact with no bruits No edema Neuro non-focal Skin warm and dry No muscular weakness   Lab Results: Basic Metabolic Panel:  Recent Labs  78/29/5601/22/16 1848 12/16/14 0236  NA 138 137  K 3.8 4.0  CL 102 103  CO2 27 26  GLUCOSE 109* 114*  BUN 10 11  CREATININE 0.74 0.71  CALCIUM 9.3 8.9   Liver Function Tests:  Recent Labs  12/15/14 1848  AST 27  ALT 30  ALKPHOS 88  BILITOT 0.4  PROT 7.2  ALBUMIN 4.5   CBC:  Recent Labs  12/16/14 0236 12/17/14 0344  WBC 10.5 9.9  HGB 13.2 13.2  HCT 39.4 39.3  MCV 83.5 84.3  PLT 364 347   Cardiac Enzymes:  Recent Labs  12/15/14 2154 12/16/14 0236  TROPONINI 0.13* 0.11*    Imaging: Dg Chest 2 View  12/15/2014   CLINICAL DATA:  Chest pain for 1 week  EXAM: CHEST  2 VIEW  COMPARISON:  None.  FINDINGS: The heart size and mediastinal contours are within normal limits. Both lungs are clear. The  visualized skeletal structures are unremarkable. Tubular structures projected over the left upper quadrant.  IMPRESSION: No active cardiopulmonary disease.   Electronically Signed   By: Sherian ReinWei-Chen  Lin M.D.   On: 12/15/2014 19:14   Ct Coronary Morp W/cta Cor W/score W/ca W/cm &/or Wo/cm  12/16/2014   ADDENDUM REPORT: 12/16/2014 13:51  CLINICAL DATA:  Chest pain  EXAM: Cardiac CTA  MEDICATIONS: Sub lingual nitro. 4mg  and lopressor 25mg   TECHNIQUE: The patient was scanned on a [employer]ips 256 slice scanner. Gantry rotation speed was 270 msecs. Collimation was .9mm. A 100 kV prospective scan was triggered in the descending thoracic aorta at 111 HU's with 5% padding centered around 78% of the R-R interval. Average HR during the scan was 62 bpm. The 3D data set was interpreted on a dedicated work station using MPR, MIP and VRT modes. A total of 80cc of contrast was used.  FINDINGS: Non-cardiac: [facility]. Distal esophageal thickening? From previous lap-band Gallstones with no inflammation  Calcium Score:  571 all 3 vessels  Coronary Arteries: Right dominant with no anomalies  LM:  Mild soft plaque  LAD: Ostial LAD and proximal LAD  with mixed plaque cannot r/o greater than 70% stenosis mid and distal LAD less than 40% calcific disease  D1:  Less than 40% calcific disease  Circumflex:  50% or greater mixed plaque in proximal vessel  OM1:  Less than 40% calcific disease  RCA: Dominant Less than 40% calcific disease in proximal mid and distal vessel  IMPRESSION:  1) Age advanced coronary calcification in all 3 major epicardial vessels 571 100th percentile for age and sex matched controls  2) Possible greater than 70% mixed plaque in proximal LAD and moderate disease in proximal circumflex  3) Distal esophageal thickening  4) Gallstones with no inflammation  Patient will be referred for cath if LAD lesion appears borderline by angio would consider FFR  Charlton Haws   Electronically Signed    By: Charlton Haws M.D.   On: 12/16/2014 13:51   12/16/2014   EXAM: OVER-READ INTERPRETATION  CT CHEST  The following report is an over-read performed by radiologist Dr. Maryelizabeth Rowan [facility] on 12/16/2014. This over-read does not include interpretation of cardiac or coronary anatomy or pathology. The coronary CTA interpretation by the cardiologist is attached.  COMPARISON:  Chest radiograph 12/15/2014  FINDINGS: Limited view of the lungs demonstrates mild ground-glass opacities at the lung bases most consistent with mild atelectasis. No pleural fluid.  There is diffuse thickening of the esophagus from the carina to the GE junction. Gastric banding device is present at the gastric cardia. Multiple gallstones are noted within the gallbladder without inflammation. No aggressive osseous lesion.  IMPRESSION: 1. Thickened distal esophagus suggests esophagitis which may relate to banding device. 2. Cholelithiasis without cholecystitis.  Electronically Signed: By: Genevive Bi M.D. On: 12/16/2014 11:28    Cardiac Studies:  ECG:   SR normal      Telemetry:  SR normal   Echo:   Medications:   . aspirin EC  81 mg Oral Daily  . atorvastatin  80 mg Oral q1800  . metoprolol tartrate  25 mg Oral BID  . pantoprazole  40 mg Oral Daily     . heparin 1,250 Units/hr (12/17/14 0429)  . nitroGLYCERIN Stopped (12/16/14 1207)    Assessment/Plan:  Chest Pain:  Some atypical features Previous lapband with GERD.  Minimally positive troponin , normal ECG  Positive family history.  Cardiac CT with very high calcium score And possible severe 2 VD especially proximal LAD  Cath in am orders written and on board Risks including stroke , MI, bleeding and need for emergency surgery discussed williling to proceed Continue ASA, heparin and nitro  Chol:  Continue statin  LapBand:  Continue pantoprazole needs outpatient GI f/u has some esophageal thickening on CT   Charlton Haws 12/17/2014, 10:00  AM

## 2014-12-18 NOTE — Interval H&P Note (Signed)
History and Physical Interval Note:  12/18/2014 8:48 AM  Hunter Gomez  has presented today for surgery, with the diagnosis of NSTEMI  The various methods of treatment have been discussed with the patient and family. After consideration of risks, benefits and other options for treatment, the patient has consented to  Procedure(s): LEFT HEART CATHETERIZATION WITH CORONARY ANGIOGRAM (N/A) as a surgical intervention .  The patient's history has been reviewed, patient examined, no change in status, stable for surgery.  I have reviewed the patient's chart and labs.  Questions were answered to the patient's satisfaction.    Cath Lab Visit (complete for each Cath Lab visit)  Clinical Evaluation Leading to the Procedure:   ACS: Yes.    Non-ACS:    Anginal Classification: CCS IV  Anti-ischemic medical therapy: Minimal Therapy (1 class of medications)  Non-Invasive Test Results: No non-invasive testing performed  Prior CABG: No previous CABG       Hunter Gomez

## 2014-12-18 NOTE — Progress Notes (Signed)
  Echocardiogram Echocardiogram Transesophageal has been performed.  Hunter Gomez 12/18/2014, 1:02 PM

## 2014-12-18 NOTE — OR Nursing (Signed)
First call to SICU at 1747.

## 2014-12-18 NOTE — Progress Notes (Signed)
Doppler exam demonstrates the blood flow to the left hand obliterates with left radial compression so we will not use the left radial artery as conduit. We will not use the right radial artery as conduit since he is right-hand dominant

## 2014-12-18 NOTE — Consult Note (Signed)
[address]       [place] [address]             [phone]        Hunter Gomez [facility] Medical Record #[id number] Date of Birth: September 21, 1971  Referring: No ref. provider found Primary Care: Ernst Breach, PA-C  Chief Complaint:    Chief Complaint  Patient presents with  . Chest Pain   coronary arteriogram and cardiac catheterization reviewed This patient was discussed in detail with his cardiologist Dr. Excell Seltzer in the cardiac catheterization laboratory  History of Present Illness:     44 year old Caucasian nondiabetic nonsmoker was admitted 3 days ago with a history of unstable angina and non-ST elevation MI. The patient has a strong family history of CAD and both parents had CABG in their 40s. The patient has been having chest pain with increasing frequency and severity since October. The patient underwent a cardiac CT scan over the weekend which was felt to be high risk for CAD. Cardiac catheterization today by Dr. Excell Seltzer demonstrates 95% left main stenosis, 95% ostial LAD stenosis, 85% ostial circumflex stenosis, 80-90% stenosis of the proximal posterior descending and probable significant stenosis of the ramus branch of the left circumflex. The patient has been stable on IV heparin over the weekend. He has had some PVCs but no significant arrhythmia.  His past medical history significant for hypertension for which she takes lisinopril. The patient has had previous lap band surgery in [place] 5 years ago The patient has hyperlipidemia   Current Activity/ Functional Status: Patient is married and has been working full-time for [employer] as a Production designer, theatre/television/film   Zubrod Score: At the time of surgery this patient's most appropriate activity status/level should be described as: []     0    Normal activity, no symptoms []     1    Restricted in physical strenuous activity but ambulatory, able to do out light work [x]     2    Ambulatory and capable of self  care, unable to do work activities, up and about                 more than 50%  Of the time                            []     3    Only limited self care, in bed greater than 50% of waking hours []     4    Completely disabled, no self care, confined to bed or chair []     5    Moribund  Past Medical History  Diagnosis Date  . Vision problem     prior use of glasses, not currently  . Muscle weakness 1997    prior seizure like activity, but none since; prior neurology eval, prior normal EEG 1997  . Twin birth     has identical twin  . Hypertension 10/2012  . HLD (hyperlipidemia)     Past Surgical History  Procedure Laterality Date  . Tonsillectomy    . Wisdom tooth extraction    . Laparoscopic gastric banding  2007    initial procedure Dr. Robb Matar in [place], adjustments in [place], [place]  . Laparoscopic gastric banding  2006    History  Smoking status  . Never Smoker   Smokeless tobacco  . Not on file    History  Alcohol Use No  History   Social History  . Marital Status: Married    Spouse Name: N/A    Number of Children: N/A  . Years of Education: N/A   Occupational History  . Not on file.   Social History Main Topics  . Smoking status: Never Smoker   . Smokeless tobacco: Not on file  . Alcohol Use: No  . Drug Use: No  . Sexual Activity: Not on file   Other Topics Concern  . Not on file   Social History Narrative   Married, 2 sons, limited exercise, [employer] laboratory techniciancommunications supervisor    No Known Allergies  Current Facility-Administered Medications  Medication Dose Route Frequency Provider Last Rate Last Dose  . 0.9 %  sodium chloride infusion  1 mL/kg/hr Intravenous Continuous Micheline ChapmanMichael D Cooper, MD      . 0.9 %  sodium chloride infusion  250 mL Intravenous PRN Micheline ChapmanMichael D Cooper, MD      . 0.9 % irrigation (POUR BTL)    PRN Kerin PernaPeter Van Trigt, MD   5,000 mL at 12/18/14 1017  . acetaminophen (TYLENOL) tablet 650 mg  650 mg Oral Q4H PRN Nolon NationsAnton Lishmanov,  MD      . aminocaproic acid (AMICAR) 10 g in sodium chloride 0.9 % 100 mL infusion   Intravenous To OR Kerin PernaPeter Van Trigt, MD      . aspirin EC tablet 81 mg  81 mg Oral Daily Nolon NationsAnton Lishmanov, MD   81 mg at 12/17/14 1035  . atorvastatin (LIPITOR) tablet 80 mg  80 mg Oral q1800 Nolon NationsAnton Lishmanov, MD   80 mg at 12/17/14 1719  . bisacodyl (DULCOLAX) EC tablet 5 mg  5 mg Oral Once Kerin PernaPeter Van Trigt, MD      . cefUROXime (ZINACEF) 1.5 g in dextrose 5 % 50 mL IVPB  1.5 g Intravenous To OR Kerin PernaPeter Van Trigt, MD      . cefUROXime (ZINACEF) 750 mg in dextrose 5 % 50 mL IVPB  750 mg Intravenous To OR Kerin PernaPeter Van Trigt, MD      . Chlorhexidine Gluconate Cloth 2 % PADS 6 each  6 each Topical Once Kerin PernaPeter Van Trigt, MD      . dexmedetomidine (PRECEDEX) 400 MCG/100ML (4 mcg/mL) infusion  0.1-0.7 mcg/kg/hr Intravenous To OR Kerin PernaPeter Van Trigt, MD      . diazepam (VALIUM) tablet 5-10 mg  5-10 mg Oral Q4H PRN Kerin PernaPeter Van Trigt, MD      . DOPamine (INTROPIN) 800 mg in dextrose 5 % 250 mL (3.2 mg/mL) infusion  0-10 mcg/kg/min Intravenous To OR Kerin PernaPeter Van Trigt, MD      . EPINEPHrine (ADRENALIN) 4 mg in dextrose 5 % 250 mL (0.016 mg/mL) infusion  0-10 mcg/min Intravenous To OR Kerin PernaPeter Van Trigt, MD      . hemostatic agents    PRN Kerin PernaPeter Van Trigt, MD   1 application at 12/18/14 1018  . heparin 30,000 units/NS 1000 mL solution for CELLSAVER   Other To OR Kerin PernaPeter Van Trigt, MD      . insulin regular (NOVOLIN R,HUMULIN R) 250 Units in sodium chloride 0.9 % 250 mL (1 Units/mL) infusion   Intravenous To OR Kerin PernaPeter Van Trigt, MD      . magnesium sulfate (IV Push/IM) injection 40 mEq  40 mEq Other To OR Kerin PernaPeter Van Trigt, MD      . metoprolol tartrate (LOPRESSOR) tablet 12.5 mg  12.5 mg Oral Once Kerin PernaPeter Van Trigt, MD      . metoprolol tartrate (LOPRESSOR)  tablet 25 mg  25 mg Oral BID Nolon Nations, MD   25 mg at 12/17/14 2100  . nitroGLYCERIN (NITROSTAT) SL tablet 0.4 mg  0.4 mg Sublingual Q5 min PRN Purvis Sheffield, MD   0.4 mg at 12/15/14 2325  .  nitroGLYCERIN 50 mg in dextrose 5 % 250 mL (0.2 mg/mL) infusion  3-30 mcg/min Intravenous Titrated Nolon Nations, MD   Stopped at 12/16/14 1207  . nitroGLYCERIN 50 mg in dextrose 5 % 250 mL (0.2 mg/mL) infusion  2-200 mcg/min Intravenous To OR Kerin Perna, MD      . ondansetron [facility]) injection 4 mg  4 mg Intravenous Q6H PRN Nolon Nations, MD      . pantoprazole (PROTONIX) EC tablet 40 mg  40 mg Oral Daily Nolon Nations, MD   40 mg at 12/17/14 1035  . phenylephrine (NEO-SYNEPHRINE) 20 mg in dextrose 5 % 250 mL (0.08 mg/mL) infusion  30-200 mcg/min Intravenous To OR Kerin Perna, MD      . potassium chloride injection 80 mEq  80 mEq Other To OR Kerin Perna, MD      . sodium chloride 0.9 % injection 3 mL  3 mL Intravenous Q12H Micheline Chapman, MD      . sodium chloride 0.9 % injection 3 mL  3 mL Intravenous PRN Micheline Chapman, MD      . Surgifoam 1 Gm with 0.9% sodium chloride (4 ml) topical solution    PRN Kerin Perna, MD   4 mL at 12/18/14 1019  . vancomycin (VANCOCIN) 1,500 mg in sodium chloride 0.9 % 250 mL IVPB  1,500 mg Intravenous To OR Kerin Perna, MD      . zolpidem (AMBIEN) tablet 5 mg  5 mg Oral QHS PRN,MR X 1 Nolon Nations, MD        Prescriptions prior to admission  Medication Sig Dispense Refill Last Dose  . acetaminophen (TYLENOL) 160 MG/5ML solution Take 240 mg by mouth every 6 (six) hours as needed for moderate pain.   Past Week at Unknown time  . dextromethorphan (DELSYM) 30 MG/5ML liquid Take 60 mg by mouth as needed for cough.   Past Week at Unknown time  . lisinopril-hydrochlorothiazide (PRINZIDE,ZESTORETIC) 20-25 MG per tablet Take 1 tablet by mouth daily. 90 tablet 1 12/14/2014 at Unknown time  . oxymetazoline (AFRIN) 0.05 % nasal spray Place 1 spray into both nostrils 2 (two) times daily as needed for congestion.   12/15/2014 at Unknown time  . omeprazole (PRILOSEC) 40 MG capsule Take 1 capsule (40 mg total) by mouth daily. 30 capsule 0 not  started yet    Family History  Problem Relation Age of Onset  . Diabetes Mother   . Hypertension Mother   . Hyperlipidemia Mother   . Heart disease Mother 79    CABG  . Peripheral vascular disease Mother   . Hypertension Father   . Hyperlipidemia Father   . Heart disease Father 28    CABG  . Stroke Father   . Hypertension Brother   . Hyperlipidemia Brother   . Cancer Maternal Grandfather     stomach  . Cancer Paternal Grandmother      Review of Systems:     Cardiac Review of Systems: Y or N  Chest Pain [  yes  ]  Resting SOB [ no  ] Exertional SOB  [ yes ]  Diego Cory  ]   Pedal Edema [no   ]  Palpitations [ yes ] Syncope  [no  ]   Presyncope [  no ]  General Review of Systems: [Y] = yes [  ]=no Constitional: recent weight change [  ]; anorexia [  ]; fatigue [  ]; nausea [  ]; night sweats [  ]; fever [  ]; or chills [  ]                                                               Dental: poor dentition[ no ]; Last Dentist visit every 6 months:   Eye : blurred vision [  ]; diplopia [   ]; vision changes [  ];  Amaurosis fugax[  ]; Resp: cough [  ];  wheezing[  ];  hemoptysis[  ]; shortness of breath[  ]; paroxysmal nocturnal dyspnea[  ]; dyspnea on exertion[yes  ]; or orthopnea[  ];  GI:  gallstones[yes by CT scan  ], vomiting[  ];  dysphagia[ yes ]; melena[  ];  hematochezia [  ]; heartburn[  yes];   Hx of  Colonoscopy[  ]; GU: kidney stones [  ]; hematuria[  ];   dysuria [  ];  nocturia[  ];  history of     obstruction [  ]; urinary frequency [  ]             Skin: rash, swelling[  ];, hair loss[  ];  peripheral edema[  ];  or itching[  ]; Musculosketetal: myalgias[  ];  joint swelling[  ];  joint erythema[  ];  joint pain[  ];  back pain[  ];  Heme/Lymph: bruising[  ];  bleeding[  ];  anemia[  ];  Neuro: TIA[  ];  headaches[  ];  stroke[  ];  vertigo[  ];  seizures[yes as a child  ];   paresthesias[  ];  difficulty walking[  ];  Psych:depression[  ]; anxiety[   ];  Endocrine: diabetes[ no ];  thyroid dysfunction[  ];  Immunizations: Flu [  ]; Pneumococcal[  ];  Other: Patient is right-hand dominant, he has had no previous thoracic injuries or pneumothorax, he had a chip fracture to his right ankle in high school, he had saline removed from his lap band reservoir earlier this month, CT scan shows thickening of the esophagus from possible reflux   Physical Exam: BP 113/59 mmHg  Pulse 60  Temp(Src) 98.2 F (36.8 C) (Oral)  Resp 18  Ht 5\' 9"  (1.753 m)  Wt 223 lb 8.7 oz (101.4 kg)  BMI 33.00 kg/m2  SpO2 95%   General: Obese Caucasian male in no acute distress HEENT: Normocephalic pupils equal , dentition adequate Neck: Supple without JVD, adenopathy, or bruit Chest: Clear to auscultation, symmetrical breath sounds, no rhonchi, no tenderness             or deformity Cardiovascular: Regular rate and rhythm, no murmur, no gallop, peripheral pulses             palpable in all extremities Abdomen:  Soft, nontender, no palpable mass or organomegaly Extremities: Warm, well-perfused, no clubbing cyanosis edema or tenderness,              no venous stasis changes of the legs Rectal/GU: Deferred Neuro: Grossly non--focal and symmetrical throughout  Skin: Clean and dry without rash or ulceration    Diagnostic Studies & Laboratory data:   Cardiac catheterization, chest x-ray, carotid Dopplers reviewed personally  Recent Radiology Findings:   Ct Coronary Morp W/cta Cor W/score W/ca W/cm &/or Wo/cm  12/16/2014   ADDENDUM REPORT: 12/16/2014 13:51  CLINICAL DATA:  Chest pain  EXAM: Cardiac CTA  MEDICATIONS: Sub lingual nitro.  and lopressor   TECHNIQUE: The patient was scanned on a Philips 256 slice scanner. Gantry rotation speed was 270 msecs. Collimation was .9mm. A 100 kV prospective scan was triggered in the descending thoracic aorta at 111 HU's with 5% padding centered around 78% of the R-R interval. Average HR during the scan was 62 bpm. The 3D  data set was interpreted on a dedicated work station using MPR, MIP and VRT modes. A total of 80cc of contrast was used.  FINDINGS: Non-cardiac: See separate report from [facility]. Distal esophageal thickening? From previous lap-band Gallstones with no inflammation  Calcium Score:  571 all 3 vessels  Coronary Arteries: Right dominant with no anomalies  LM:  Mild soft plaque  LAD: Ostial LAD and proximal LAD with mixed plaque cannot r/o greater than 70% stenosis mid and distal LAD less than 40% calcific disease  D1:  Less than 40% calcific disease  Circumflex:  50% or greater mixed plaque in proximal vessel  OM1:  Less than 40% calcific disease  RCA: Dominant Less than 40% calcific disease in proximal mid and distal vessel  IMPRESSION:  1) Age advanced coronary calcification in all 3 major epicardial vessels 571 100th percentile for age and sex matched controls  2) Possible greater than 70% mixed plaque in proximal LAD and moderate disease in proximal circumflex  3) Distal esophageal thickening  4) Gallstones with no inflammation  Patient will be referred for cath if LAD lesion appears borderline by angio would consider FFR  Charlton Haws   Electronically Signed   By: Charlton Haws M.D.   On: 12/16/2014 13:51   12/16/2014   EXAM: OVER-READ INTERPRETATION  CT CHEST  The following report is an over-read performed by radiologist Dr. Maryelizabeth Rowan [facility] on 12/16/2014. This over-read does not include interpretation of cardiac or coronary anatomy or pathology. The coronary CTA interpretation by the cardiologist is attached.  COMPARISON:  Chest radiograph 12/15/2014  FINDINGS: Limited view of the lungs demonstrates mild ground-glass opacities at the lung bases most consistent with mild atelectasis. No pleural fluid.  There is diffuse thickening of the esophagus from the carina to the GE junction. Gastric banding device is present at the gastric cardia. Multiple gallstones are noted within  the gallbladder without inflammation. No aggressive osseous lesion.  IMPRESSION: 1. Thickened distal esophagus suggests esophagitis which may relate to banding device. 2. Cholelithiasis without cholecystitis.  Electronically Signed: By: Genevive Bi M.D. On: 12/16/2014 11:28   Cardiac catheterization- I personally reviewed the images with Dr. Excell Seltzer-- the patient has severe left main and three-vessel CAD with preserved LV systolic function and no evidence of valvular disease    Recent Lab Findings: Lab Results  Component Value Date   WBC 9.7 12/18/2014   HGB 12.9* 12/18/2014   HCT 38.4* 12/18/2014   PLT 329 12/18/2014   GLUCOSE 114* 12/16/2014   CHOL 209* 12/04/2014   TRIG 262* 12/04/2014   HDL 31* 12/04/2014   LDLCALC 126* 12/04/2014   ALT 30 12/15/2014   AST 27 12/15/2014   NA 137 12/16/2014   K 4.0 12/16/2014  CL 103 12/16/2014   CREATININE 0.71 12/16/2014   BUN 11 12/16/2014   CO2 26 12/16/2014   TSH 2.366 12/04/2014   INR 1.18 12/18/2014   HGBA1C 5.5 12/16/2014      Assessment / Plan:     Unstable angina with non-ST elevation MI   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 I have discussed the procedure in detail with the patient and his family including the benefits risks and alternatives. He demonstrates his understanding and agreed to proceed with surgery. Because of his young age we will plan on using left radial artery as well as left IMA and endoscopic saphenous vein for conduit.       @ME1 @ 12/18/2014 10:26 AM

## 2014-12-18 NOTE — OR Nursing (Signed)
Second call to SICU at 1831.

## 2014-12-18 NOTE — Brief Op Note (Signed)
12/15/2014 - 12/18/2014  4:41 PM  PATIENT:  Hunter Gomez  44 y.o. male  PRE-OPERATIVE DIAGNOSIS:  SEVERE LEFT MAIN CAD  POST-OPERATIVE DIAGNOSIS:  SEVERE LEFT MAIN CAD  PROCEDURE:   EMERGENCY CORONARY ARTERY BYPASS GRAFTING x 5 (LIMA-LAD, SVG-D, SVG-OM, Free RIMA-Ramus, SVG-PD)  ENDOSCOPIC VEIN HARVEST RIGHT LEG  SURGEON:  Kerin PernaPeter Van Trigt, MD  ASSISTANT: Coral CeoGina Torrence Branagan, PA-C  ANESTHESIA:   general  PATIENT CONDITION:  ICU - intubated and hemodynamically stable.  PRE-OPERATIVE WEIGHT: 102 kg

## 2014-12-18 NOTE — CV Procedure (Signed)
    Cardiac Catheterization Procedure Note  Name: Hunter Gomez MRN: [id number] DOB: 1971/10/31  Procedure: Left Heart Cath, Selective Coronary Angiography, LV angiography  Indication: NSTEMI. 44 year-old male with strong family history of premature CAD presents with symptoms of unstable angina and minimal troponin elevation. He had a cardiac CTA demonstrating a very high coronary calcium score with obstructive disease identified in the LAD.   Procedural Details: The right wrist was prepped, draped, and anesthetized with 1% lidocaine. Using the modified Seldinger technique, a 5/6 French Slender sheath was introduced into the right radial artery. 3 mg of verapamil was administered through the sheath, weight-based unfractionated heparin was administered intravenously. Standard Judkins catheters were used for selective coronary angiography and left ventriculography. Catheter exchanges were performed over an exchange length guidewire. There were no immediate procedural complications. A TR band was used for radial hemostasis at the completion of the procedure.  The patient was transferred to the post catheterization recovery area for further monitoring.  Procedural Findings: Hemodynamics: AO 112/68 LV 115/15  Coronary angiography: Coronary dominance: right  Left mainstem: The left mainstem arises from the left coronary cusp. There is an eccentric 95% distal left main stenosis extending into the ostium of the LAD and left circumflex.  Left anterior descending (LAD): The ostium of the LAD has severe stenosis of 90%. The remainder of the LAD has mild diffuse irregularity without high-grade stenosis identified. The mid LAD has 30% stenosis. The diagonal branches are patent.  Left circumflex (LCx): The left circumflex has 80-90% ostial stenosis. The proximal vessel has 50% stenosis. There is a single small obtuse marginal branch arising from the circumflex. There is an intermediate branch present. The  ostium of the intermediate branch is not well visualized. The mid vessel has no significant obstructive disease.  Right coronary artery (RCA): Dominant vessel. The proximal, mid, and distal vessel has mild irregularity without high-grade stenosis. The ostium of the PDA branch has 80% stenosis. The ostium of the PLA branch has 75% stenosis.  Left ventriculography: Left ventricular systolic function is normal, LVEF is estimated at 55-65%, there is no significant mitral regurgitation   Estimated Blood Loss: Minimal  Final Conclusions:   1. Severe left main stenosis 2. Multivessel coronary artery disease with left main stenosis extension into the ostium of the LAD and left circumflex, and severe stenosis of the PDA and PLA branches of the RCA. 3. Normal LV function  Recommendations: Urgent CABG  Tonny BollmanMichael Berlie Persky MD, [facility] Hospital[facility] 12/18/2014, 9:26 AM

## 2014-12-18 NOTE — Anesthesia Preprocedure Evaluation (Signed)
Anesthesia Evaluation  Patient identified by MRN, date of birth, ID band Patient awake    Reviewed: Allergy & Precautions, NPO status , Patient's Chart, lab work & pertinent test results, reviewed documented beta blocker date and time   History of Anesthesia Complications Negative for: history of anesthetic complications  Airway Mallampati: III  TM Distance: >3 FB Neck ROM: Full    Dental  (+) Teeth Intact, Dental Advisory Given,    Pulmonary neg pulmonary ROS,          Cardiovascular hypertension, Pt. on medications + Past MI     Neuro/Psych    GI/Hepatic Neg liver ROS, GERD-  ,  Endo/Other  negative endocrine ROS  Renal/GU negative Renal ROS     Musculoskeletal negative musculoskeletal ROS (+)   Abdominal   Peds  Hematology negative hematology ROS (+)   Anesthesia Other Findings Previous Lap gastric band  Reproductive/Obstetrics                             Anesthesia Physical Anesthesia Plan  ASA: III  Anesthesia Plan: General   Post-op Pain Management:    Induction: Intravenous  Airway Management Planned: Oral ETT  Additional Equipment: Arterial line, PA Cath, CVP and 3D TEE  Intra-op Plan:   Post-operative Plan: Post-operative intubation/ventilation  Informed Consent: I have reviewed the patients History and Physical, chart, labs and discussed the procedure including the risks, benefits and alternatives for the proposed anesthesia with the patient or authorized representative who has indicated his/her understanding and acceptance.   Dental advisory given  Plan Discussed with:   Anesthesia Plan Comments:         Anesthesia Quick Evaluation

## 2014-12-19 ENCOUNTER — Encounter (HOSPITAL_COMMUNITY): Payer: Self-pay | Admitting: Cardiothoracic Surgery

## 2014-12-19 ENCOUNTER — Inpatient Hospital Stay (HOSPITAL_COMMUNITY): Payer: BLUE CROSS/BLUE SHIELD

## 2014-12-19 DIAGNOSIS — Z951 Presence of aortocoronary bypass graft: Secondary | ICD-10-CM

## 2014-12-19 DIAGNOSIS — I214 Non-ST elevation (NSTEMI) myocardial infarction: Principal | ICD-10-CM

## 2014-12-19 LAB — POCT I-STAT, CHEM 8
BUN: 7 mg/dL (ref 6–23)
BUN: 10 mg/dL (ref 6–23)
CALCIUM ION: 1.21 mmol/L (ref 1.12–1.23)
Calcium, Ion: 1.19 mmol/L (ref 1.12–1.23)
Chloride: 100 mmol/L (ref 96–112)
Chloride: 99 mmol/L (ref 96–112)
Creatinine, Ser: 0.6 mg/dL (ref 0.50–1.35)
Creatinine, Ser: 0.6 mg/dL (ref 0.50–1.35)
Glucose, Bld: 133 mg/dL — ABNORMAL HIGH (ref 70–99)
Glucose, Bld: 127 mg/dL — ABNORMAL HIGH (ref 70–99)
HEMATOCRIT: 34 % — AB (ref 39.0–52.0)
HEMATOCRIT: 31 % — AB (ref 39.0–52.0)
HEMOGLOBIN: 10.5 g/dL — AB (ref 13.0–17.0)
Hemoglobin: 11.6 g/dL — ABNORMAL LOW (ref 13.0–17.0)
POTASSIUM: 4 mmol/L (ref 3.5–5.1)
Potassium: 4 mmol/L (ref 3.5–5.1)
SODIUM: 133 mmol/L — AB (ref 135–145)
Sodium: 136 mmol/L (ref 135–145)
TCO2: 24 mmol/L (ref 0–100)
TCO2: 24 mmol/L (ref 0–100)

## 2014-12-19 LAB — GLUCOSE, CAPILLARY
GLUCOSE-CAPILLARY: 103 mg/dL — AB (ref 70–99)
GLUCOSE-CAPILLARY: 112 mg/dL — AB (ref 70–99)
GLUCOSE-CAPILLARY: 127 mg/dL — AB (ref 70–99)
GLUCOSE-CAPILLARY: 127 mg/dL — AB (ref 70–99)
GLUCOSE-CAPILLARY: 114 mg/dL — AB (ref 70–99)
GLUCOSE-CAPILLARY: 107 mg/dL — AB (ref 70–99)
GLUCOSE-CAPILLARY: 127 mg/dL — AB (ref 70–99)
GLUCOSE-CAPILLARY: 113 mg/dL — AB (ref 70–99)
GLUCOSE-CAPILLARY: 115 mg/dL — AB (ref 70–99)
Glucose-Capillary: 106 mg/dL — ABNORMAL HIGH (ref 70–99)
Glucose-Capillary: 110 mg/dL — ABNORMAL HIGH (ref 70–99)
Glucose-Capillary: 111 mg/dL — ABNORMAL HIGH (ref 70–99)
Glucose-Capillary: 112 mg/dL — ABNORMAL HIGH (ref 70–99)
Glucose-Capillary: 116 mg/dL — ABNORMAL HIGH (ref 70–99)
Glucose-Capillary: 117 mg/dL — ABNORMAL HIGH (ref 70–99)
Glucose-Capillary: 120 mg/dL — ABNORMAL HIGH (ref 70–99)
Glucose-Capillary: 129 mg/dL — ABNORMAL HIGH (ref 70–99)
Glucose-Capillary: 133 mg/dL — ABNORMAL HIGH (ref 70–99)
Glucose-Capillary: 162 mg/dL — ABNORMAL HIGH (ref 70–99)
Glucose-Capillary: 108 mg/dL — ABNORMAL HIGH (ref 70–99)

## 2014-12-19 LAB — TROPONIN I
Troponin I: 4.22 ng/mL (ref <0.031)
Troponin I: 2.85 ng/mL (ref <0.031)

## 2014-12-19 LAB — CBC
HCT: 32.8 % — ABNORMAL LOW (ref 39.0–52.0)
HCT: 29.9 % — ABNORMAL LOW (ref 39.0–52.0)
HCT: 29.2 % — ABNORMAL LOW (ref 39.0–52.0)
Hemoglobin: 11.5 g/dL — ABNORMAL LOW (ref 13.0–17.0)
Hemoglobin: 10.2 g/dL — ABNORMAL LOW (ref 13.0–17.0)
Hemoglobin: 10.1 g/dL — ABNORMAL LOW (ref 13.0–17.0)
MCH: 29 pg (ref 26.0–34.0)
MCH: 27.9 pg (ref 26.0–34.0)
MCH: 28.4 pg (ref 26.0–34.0)
MCHC: 35.1 g/dL (ref 30.0–36.0)
MCHC: 34.1 g/dL (ref 30.0–36.0)
MCHC: 34.6 g/dL (ref 30.0–36.0)
MCV: 82.6 fL (ref 78.0–100.0)
MCV: 81.9 fL (ref 78.0–100.0)
MCV: 82 fL (ref 78.0–100.0)
Platelets: 276 10*3/uL (ref 150–400)
Platelets: 250 10*3/uL (ref 150–400)
Platelets: 232 10*3/uL (ref 150–400)
RBC: 3.97 MIL/uL — ABNORMAL LOW (ref 4.22–5.81)
RBC: 3.65 MIL/uL — ABNORMAL LOW (ref 4.22–5.81)
RBC: 3.56 MIL/uL — AB (ref 4.22–5.81)
RDW: 12.6 % (ref 11.5–15.5)
RDW: 12.7 % (ref 11.5–15.5)
RDW: 13 % (ref 11.5–15.5)
WBC: 23.6 10*3/uL — ABNORMAL HIGH (ref 4.0–10.5)
WBC: 19.2 10*3/uL — ABNORMAL HIGH (ref 4.0–10.5)
WBC: 20.2 10*3/uL — ABNORMAL HIGH (ref 4.0–10.5)

## 2014-12-19 LAB — POCT I-STAT 3, ART BLOOD GAS (G3+)
ACID-BASE DEFICIT: 1 mmol/L (ref 0.0–2.0)
Acid-base deficit: 1 mmol/L (ref 0.0–2.0)
Acid-base deficit: 2 mmol/L (ref 0.0–2.0)
Acid-base deficit: 3 mmol/L — ABNORMAL HIGH (ref 0.0–2.0)
BICARBONATE: 25.4 meq/L — AB (ref 20.0–24.0)
BICARBONATE: 23.7 meq/L (ref 20.0–24.0)
Bicarbonate: 25 mEq/L — ABNORMAL HIGH (ref 20.0–24.0)
Bicarbonate: 24.8 mEq/L — ABNORMAL HIGH (ref 20.0–24.0)
O2 SAT: 98 %
O2 SAT: 99 %
O2 Saturation: 98 %
O2 Saturation: 98 %
PCO2 ART: 43.9 mmHg (ref 35.0–45.0)
PO2 ART: 111 mmHg — AB (ref 80.0–100.0)
PO2 ART: 122 mmHg — AB (ref 80.0–100.0)
PO2 ART: 124 mmHg — AB (ref 80.0–100.0)
Patient temperature: 36.6
Patient temperature: 37
Patient temperature: 36.5
TCO2: 25 mmol/L (ref 0–100)
TCO2: 26 mmol/L (ref 0–100)
TCO2: 27 mmol/L (ref 0–100)
TCO2: 27 mmol/L (ref 0–100)
pCO2 arterial: 50.4 mmHg — ABNORMAL HIGH (ref 35.0–45.0)
pCO2 arterial: 50.8 mmHg — ABNORMAL HIGH (ref 35.0–45.0)
pCO2 arterial: 45.2 mmHg — ABNORMAL HIGH (ref 35.0–45.0)
pH, Arterial: 7.304 — ABNORMAL LOW (ref 7.350–7.450)
pH, Arterial: 7.305 — ABNORMAL LOW (ref 7.350–7.450)
pH, Arterial: 7.324 — ABNORMAL LOW (ref 7.350–7.450)
pH, Arterial: 7.357 (ref 7.350–7.450)
pO2, Arterial: 113 mmHg — ABNORMAL HIGH (ref 80.0–100.0)

## 2014-12-19 LAB — PREPARE PLATELET PHERESIS: Unit division: 0

## 2014-12-19 LAB — BASIC METABOLIC PANEL
Anion gap: 9 (ref 5–15)
BUN: 7 mg/dL (ref 6–23)
CO2: 24 mmol/L (ref 19–32)
CREATININE: 0.76 mg/dL (ref 0.50–1.35)
Calcium: 7.7 mg/dL — ABNORMAL LOW (ref 8.4–10.5)
Chloride: 103 mmol/L (ref 96–112)
GFR calc Af Amer: >90 mL/min (ref >90)
GLUCOSE: 114 mg/dL — AB (ref 70–99)
Potassium: 3.6 mmol/L (ref 3.5–5.1)
SODIUM: 136 mmol/L (ref 135–145)

## 2014-12-19 LAB — MAGNESIUM
MAGNESIUM: 2.2 mg/dL (ref 1.5–2.5)
Magnesium: 2.8 mg/dL — ABNORMAL HIGH (ref 1.5–2.5)
Magnesium: 2.2 mg/dL (ref 1.5–2.5)

## 2014-12-19 LAB — CK TOTAL AND CKMB (NOT AT ARMC)
CK, MB: 36.7 ng/mL (ref 0.3–4.0)
Relative Index: 4.5 — ABNORMAL HIGH (ref 0.0–2.5)
Total CK: 815 U/L — ABNORMAL HIGH (ref 7–232)

## 2014-12-19 LAB — CREATININE, SERUM
CREATININE: 0.71 mg/dL (ref 0.50–1.35)
Creatinine, Ser: 0.68 mg/dL (ref 0.50–1.35)
GFR calc Af Amer: >90 mL/min (ref >90)
GFR calc Af Amer: >90 mL/min (ref >90)
GFR calc non Af Amer: >90 mL/min (ref >90)

## 2014-12-19 LAB — PREPARE FRESH FROZEN PLASMA

## 2014-12-19 MED ORDER — OMEGA-3-ACID ETHYL ESTERS 1 G PO CAPS
1.0000 g | ORAL_CAPSULE | Freq: Two times a day (BID) | ORAL | Status: DC
Start: 2014-12-20 — End: 2014-12-23
  Administered 2014-12-20 – 2014-12-23 (×7): 1 g via ORAL
  Filled 2014-12-19 (×8): qty 1

## 2014-12-19 MED ORDER — FUROSEMIDE 10 MG/ML IJ SOLN
20.0000 mg | Freq: Two times a day (BID) | INTRAMUSCULAR | Status: AC
Start: 2014-12-19 — End: 2014-12-19
  Administered 2014-12-19 (×2): 20 mg via INTRAVENOUS
  Filled 2014-12-19 (×2): qty 2

## 2014-12-19 MED ORDER — INSULIN ASPART 100 UNIT/ML ~~LOC~~ SOLN
0.0000 [IU] | SUBCUTANEOUS | Status: DC
  Administered 2014-12-19 – 2014-12-20 (×2): 2 [IU] via SUBCUTANEOUS
  Administered 2014-12-20: 4 [IU] via SUBCUTANEOUS

## 2014-12-19 MED ORDER — METOPROLOL TARTRATE 25 MG/10 ML ORAL SUSPENSION
12.5000 mg | Freq: Two times a day (BID) | ORAL | Status: DC
  Filled 2014-12-19 (×4): qty 5

## 2014-12-19 MED ORDER — POTASSIUM CHLORIDE 10 MEQ/50ML IV SOLN
10.0000 meq | INTRAVENOUS | Status: AC
  Administered 2014-12-19 (×3): 10 meq via INTRAVENOUS
  Filled 2014-12-19: qty 50

## 2014-12-19 MED ORDER — POTASSIUM CHLORIDE CRYS ER 20 MEQ PO TBCR
20.0000 meq | EXTENDED_RELEASE_TABLET | Freq: Once | ORAL | Status: AC
  Administered 2014-12-19: 20 meq via ORAL
  Filled 2014-12-19: qty 1

## 2014-12-19 MED ORDER — METOPROLOL TARTRATE 12.5 MG HALF TABLET
12.5000 mg | ORAL_TABLET | Freq: Two times a day (BID) | ORAL | Status: DC
  Administered 2014-12-20 (×2): 12.5 mg via ORAL
  Filled 2014-12-19 (×4): qty 1

## 2014-12-19 MED ORDER — INSULIN DETEMIR 100 UNIT/ML ~~LOC~~ SOLN: 10.0000 [IU] | Freq: Every day | SUBCUTANEOUS | Status: DC

## 2014-12-19 MED ORDER — CETYLPYRIDINIUM CHLORIDE 0.05 % MT LIQD
7.0000 mL | Freq: Two times a day (BID) | OROMUCOSAL | Status: DC
  Administered 2014-12-19 – 2014-12-23 (×8): 7 mL via OROMUCOSAL

## 2014-12-19 MED ORDER — INSULIN DETEMIR 100 UNIT/ML ~~LOC~~ SOLN
10.0000 [IU] | Freq: Every day | SUBCUTANEOUS | Status: DC
  Administered 2014-12-19 – 2014-12-20 (×2): 10 [IU] via SUBCUTANEOUS
  Filled 2014-12-19 (×3): qty 0.1

## 2014-12-19 MED FILL — Lidocaine HCl IV Inj 20 MG/ML: INTRAVENOUS | Qty: 5 | Status: AC

## 2014-12-19 MED FILL — Sodium Chloride IV Soln 0.9%: INTRAVENOUS | Qty: 2000 | Status: AC

## 2014-12-19 MED FILL — Mannitol IV Soln 20%: INTRAVENOUS | Qty: 500 | Status: AC

## 2014-12-19 MED FILL — Sodium Bicarbonate IV Soln 8.4%: INTRAVENOUS | Qty: 50 | Status: AC

## 2014-12-19 MED FILL — Heparin Sodium (Porcine) Inj 1000 Unit/ML: INTRAMUSCULAR | Qty: 30 | Status: AC

## 2014-12-19 MED FILL — Electrolyte-R (PH 7.4) Solution: INTRAVENOUS | Qty: 4000 | Status: AC

## 2014-12-19 MED FILL — Calcium Chloride Inj 10%: INTRAVENOUS | Qty: 10 | Status: AC

## 2014-12-19 NOTE — Progress Notes (Signed)
Nutrition Brief Note  Patient identified due to low Braden Score  Wt Readings from Last 15 Encounters:  12/19/14 232 lb 1.6 oz (105.28 kg)  12/04/14 218 lb (98.884 kg)  12/22/13 206 lb (93.441 kg)  08/01/13 224 lb (101.606 kg)  03/16/13 220 lb (99.791 kg)  10/25/12 213 lb (96.616 kg)    Body mass index is 35.3 kg/(m^2). Patient meets criteria for Obesity based on current BMI. Pt states that he was eating well PTA and was maintaining his usual weight of 215 lbs. Pt reports making an effort to eat healthier.   Lipid Panel     Component Value Date/Time   CHOL 209* 12/04/2014 1119   TRIG 262* 12/04/2014 1119   HDL 31* 12/04/2014 1119   CHOLHDL 6.7 12/04/2014 1119   VLDL 52* 12/04/2014 1119   LDLCALC 126* 12/04/2014 1119    RD provided "Heart Healthy Nutrition Therapy" handout from the Academy of Nutrition and Dietetics. Reviewed patient's dietary recall. Provided examples on ways to decrease sodium and fat intake in diet. Discouraged intake of processed foods and use of salt shaker. Encouraged fresh fruits and vegetables as well as whole grain sources of carbohydrates to maximize fiber intake. Teach back method used. Expect good compliance.  Current diet order is Clear Liquids due to recent surgery, patient is consuming approximately 25% of meals at this time. Labs and medications reviewed.   No further nutrition interventions warranted at this time. If nutrition issues arise, please consult RD.   Ian Malkineanne Barnett RD, LDN Inpatient Clinical Dietitian Pager: [phone] After Hours Pager: [phone]

## 2014-12-19 NOTE — Procedures (Signed)
Extubation Procedure Note  Patient Details:   Name: Hunter Gomez DOB: 24-Jul-1971 MRN: [id number]   Airway Documentation:  Airway 8 mm (Active)  Secured at (cm) 22 cm 12/19/2014 12:15 AM  Measured From Lips 12/18/2014 11:55 PM  Secured Location Right 12/18/2014 11:55 PM  Secured By Caron PresumePink Tape 12/18/2014 11:55 PM    Evaluation  O2 sats: stable throughout Complications: No apparent complications Patient did tolerate procedure well. Bilateral Breath Sounds: Clear   Patient extubated to 5 lpm nasal cannula.  VC 1.0L  NIF -32  Positive cuff leak noted.  Patient able to vocalize post extubation.   Clent Ridgesllis, Ed Mandich Michelle 12/19/2014, 2:48 AM 2

## 2014-12-19 NOTE — Progress Notes (Addendum)
TCTS DAILY ICU PROGRESS NOTE                   [address]            [place] [address]          [phone]   1 Day Post-Op Procedure(s) (LRB): CORONARY ARTERY BYPASS GRAFTING (CABG), ON PUMP, TIMES FIVE, USING BILATERAL INTERNAL MAMMARY ARTERIES, RIGHT GREATER SAPHENOUS VEIN HARVESTED ENDOSCOPICALLY (N/A) INTRAOPERATIVE TRANSESOPHAGEAL ECHOCARDIOGRAM (N/A)  Total Length of Stay:  LOS: 4 days   Subjective: Patient with some incisional pain and numbness in first 4 fingers left hand.  Objective: Vital signs in last 24 hours: Temp:  [96.8 F (36 C)-98.6 F (37 C)] 97.7 F (36.5 C) (01/26 0800) Pulse Rate:  [60-95] 71 (01/26 0800) Cardiac Rhythm:  [-] Normal sinus rhythm (01/26 0700) Resp:  [11-26] 20 (01/26 0800) BP: (95-144)/(50-82) 97/56 mmHg (01/26 0800) SpO2:  [97 %-100 %] 100 % (01/26 0800) Arterial Line BP: (93-140)/(51-73) 115/58 mmHg (01/26 0800) FiO2 (%):  [40 %-50 %] 40 % (01/26 0120) Weight:  [224 lb 13.9 oz (102 kg)-232 lb 1.6 oz (105.28 kg)] 232 lb 1.6 oz (105.28 kg) (01/26 0500)  Filed Weights   12/16/14 0020 12/18/14 1000 12/19/14 0500  Weight: 223 lb 8.7 oz (101.4 kg) 224 lb 13.9 oz (102 kg) 232 lb 1.6 oz (105.28 kg)    Pre op weight: 102 kg  Hemodynamic parameters for last 24 hours: PAP: (23-37)/(13-22) 35/18 mmHg CO:  [4.1 L/min-5.9 L/min] 5.1 L/min CI:  [1.9 L/min/m2-2.8 L/min/m2] 2.4 L/min/m2  Intake/Output from previous day: 01/25 0701 - 01/26 0700 In: 5482.3 [I.V.:3340.3; Blood:1042; IV Piggyback:1100] Out: 5015 [Urine:3425; Blood:1050; Chest Tube:540]  Intake/Output this shift: Total I/O In: 241.8 [I.V.:241.8] Out: 225 [Urine:175; Chest Tube:50]  Current Meds: Scheduled Meds: . acetaminophen  1,000 mg Oral 4 times per day   Or  . acetaminophen (TYLENOL) oral liquid 160 mg/5 mL  1,000 mg Per Tube 4 times per day  . antiseptic oral rinse  7 mL Mouth Rinse QID  . aspirin EC  325 mg Oral Daily   Or  . aspirin  324 mg  Per Tube Daily  . atorvastatin  80 mg Oral q1800  . bisacodyl  10 mg Oral Daily   Or  . bisacodyl  10 mg Rectal Daily  . cefUROXime (ZINACEF)  IV  1.5 g Intravenous Q12H  . docusate sodium  200 mg Oral Daily  . famotidine (PEPCID) IV  20 mg Intravenous Q12H  . insulin regular  0-10 Units Intravenous TID WC  . metoprolol tartrate  12.5 mg Oral BID   Or  . metoprolol tartrate  12.5 mg Per Tube BID  . [START ON 12/20/2014] pantoprazole  40 mg Oral Daily  . potassium chloride  10 mEq Intravenous Q1 Hr x 3  . sodium chloride  3 mL Intravenous Q12H   Continuous Infusions: . sodium chloride 20 mL/hr at 12/19/14 0800  . sodium chloride    . sodium chloride 20 mL/hr at 12/19/14 0800  . dexmedetomidine Stopped (12/19/14 0030)  . insulin (NOVOLIN-R) infusion 3.4 Units/hr (12/19/14 0800)  . lactated ringers 20 mL/hr at 12/19/14 0800  . nitroGLYCERIN    . phenylephrine (NEO-SYNEPHRINE) Adult infusion 25.067 mcg/min (12/19/14 0800)   PRN Meds:.albumin human, metoprolol, midazolam, morphine injection, ondansetron (ZOFRAN) IV, oxyCODONE, sodium chloride, traMADol  General appearance: alert, cooperative and no distress Neurologic: intact Heart: RRR, rub with CTs in place Lungs: Diminished at bases  Abdomen: soft, non-tender; bowel sounds normal; no masses,  no organomegaly Extremities: Mild LE edema;brusing right LE Wound: Aquacel dressing intact;RLE dresings are clean and dry  Lab Results: CBC: Recent Labs  12/19/14 0126 12/19/14 0133 12/19/14 0519  WBC 23.6*  --  19.2*  HGB 11.5* 11.6* 10.2*  HCT 32.8* 34.0* 29.9*  PLT 276  --  250   BMET:  Recent Labs  12/18/14 1040  12/19/14 0133 12/19/14 0519  NA 138  < > 136 136  K 4.1  < > 4.0 3.6  CL 104  < > 100 103  CO2 29  --   --  24  GLUCOSE 102*  < > 133* 114*  BUN 10  < > 7 7  CREATININE 0.79  < > 0.60 0.76  CALCIUM 8.9  --   --  7.7*  < > = values in this interval not displayed.  PT/INR:  Recent Labs  12/18/14 1910    LABPROT 15.9*  INR 1.25   Radiology: Dg Chest Port 1 View  12/18/2014   CLINICAL DATA:  Status post CABG.  Initial encounter.  EXAM: PORTABLE CHEST - 1 VIEW  COMPARISON:  Chest radiograph from 12/15/2014, and CTA of the chest performed 12/16/2014  FINDINGS: The patient's endotracheal tube is seen ending 3-4 cm above the carina. A right IJ Swan-Ganz catheter is noted ending overlying the main pulmonary outflow tract, directed towards the right. The enteric tube is noted ending overlying the fundus of the stomach, with the side port at the body of the stomach. Bilateral chest tubes are noted, without evidence of pneumothorax. A mediastinal drain is partially characterized.  The lungs are hypoexpanded. Vascular crowding is noted. Bibasilar airspace opacities likely reflect atelectasis. No pleural effusion is seen.  The cardiomediastinal silhouette remains normal in size. The patient is status post median sternotomy, with evidence of CABG. No acute osseous abnormalities are seen.  IMPRESSION: 1. Endotracheal tube seen ending 3-4 cm above the carina. 2. Right IJ Swan-Ganz catheter noted ending overlying the main pulmonary outflow tract, directed towards the right. 3. Bilateral chest tubes and mediastinal drain noted, without evidence of pneumothorax. 4. Lungs hypoexpanded, with bibasilar atelectasis.   Electronically Signed   By: Roanna RaiderJeffery  Chang M.D.   On: 12/18/2014 20:30     Assessment/Plan: S/P Procedure(s) (LRB): CORONARY ARTERY BYPASS GRAFTING (CABG), ON PUMP, TIMES FIVE, USING BILATERAL INTERNAL MAMMARY ARTERIES, RIGHT GREATER SAPHENOUS VEIN HARVESTED ENDOSCOPICALLY (N/A) INTRAOPERATIVE TRANSESOPHAGEAL ECHOCARDIOGRAM (N/A)  1.CV- SR in the 70's. On Neo syneprhine and Dopamine drips. Wean as tolerates. Hold Lopressor for today. 2.Pulmonary-Chest tubes with 590 cc since surgery. Chest tubes to remain for now. CXR shows patient is rotated to the right, bibasilar atelectasis, cardiomegaly, no  pneumothorax. Encourage incentive spirometer 3.Volume Overload-Lasix 20 IV later today 4. ABL anemia-H and H 10.2 and 29.9 5. Supplement potassium 6. CBGs 115/133/114. Pre op HGA1C 5.5. Will stop accu checks and SS once transferred to 2000/PCTU 7. Regarding left finger numbness, likely related to positioning at surgery and possibly a line. Will remove a line as soon as off drips. 8. Hyperlipidemia-on Lipitor. Triglycerides over 250 so will start Omega 3 as well 9. Please see progression orders  Desare Duddy M PA-C 12/19/2014 8:48 AM

## 2014-12-19 NOTE — Op Note (Signed)
Hunter Gomez, FERRALL NO.:  [id number]  MEDICAL RECORD NO.:  [id number]  LOCATION:  2S01C                        FACILITY:  [facility]  PHYSICIAN:  Kerin Perna, M.D.  DATE OF BIRTH:  01/23/1971  DATE OF PROCEDURE:  12/18/2014 DATE OF DISCHARGE:                              OPERATIVE REPORT   OPERATIONS: 1. Emergency coronary artery bypass grafting x5 (left internal mammary     artery to left anterior descending artery, right free internal     mammary artery graft to ramus intermediate, saphenous vein graft to     diagonal,  saphenous vein graft to obtuse marginal, saphenous vein     graft to posterior descending). 2. Endoscopic harvest of right leg greater saphenous vein.  PREOPERATIVE DIAGNOSES: 1. Unstable angina. 2. Non-ST elevation myocardial infarction. 3. Severe left main and three-vessel coronary artery disease.  POSTOPERATIVE DIAGNOSES: 1. Unstable angina. 2. Non-ST elevation myocardial infarction. 3. Severe left main and three-vessel coronary artery disease.  SURGEON:  Kerin Perna MD.  ASSISTANT:  Coral Ceo, PA-C.  ANESTHESIA:  General by Dr. Laverle Hobby.  INDICATIONS:  The patient is a 44 year old Caucasian male with a strong family history  of coronary disease, who presents with several weeks history of chest pain worsening in intensity and frequency.  He presented to the emergency department.  His cardiac enzymes were positive.  He was admitted.  He underwent a cardiac CT scan, which was suspicious for coronary artery  disease.  He then underwent cardiac catheterizations on the date of surgery, where he was found to have a 95% critical left main stenosis,  proximal 90% LAD stenosis, proximal 95% proximal circumflex stenosis, and high-grade distal right coronary artery disease.  Urgent coronary bypass grafting was recommended by his cardiologist.  I examined the patient in the Cath Lab and reviewed the coronary arteriograms with  his cardiologist.  I agreed with the recommendation for urgent surgery.  I had  reviewed the operation with the patient and his family including the indications, expected benefits, alternatives, and risks.  I discussed the  details of surgery and the expected recovery.  He understood that the risks involved including risks of stroke, MI, bleeding requiring blood transfusion, wound infection, postoperative pulmonary problems including pleural effusion, postoperative arrhythmias, and death.  After reviewing these issues, he demonstrated his understanding and agreed to proceed with surgery, and what I felt was an informed consent.  OPERATIVE FINDINGS: 1. Small coronaries. 2. Adequate conduit with thin-walled vein. 3. Left ventricular hypertrophy consistent with history of     hypertension.  OPERATIVE PROCEDURE:  The patient was brought to the operating room and placed supine on the operating table.  General anesthesia was induced under invasive hemodynamic monitoring.  The chest, abdomen, and legs were prepped with Betadine and draped as a sterile field.  A proper time- out was  performed.  A sternal incision was made as the saphenous vein was harvested endoscopically from the right leg.  The left internal mammary artery was harvested as a pedicle graft from its origin at the subclavian vessels.  The right internal mammary artery was harvested as a free graft from its origin at the  subclavian vessels.  The free right internal mammary artery was cannulated and placed in a papaverine- heparin solution. The sternal retractor was placed and the pericardium was opened and suspended.  Pursestrings were placed in the ascending aorta and right atrium and after the vein had been harvested from the leg, full heparinization was administered and the ACT was documented as being therapeutic.  The patient was then cannulated and placed on cardiopulmonary bypass.  The coronary arteries were identified  for grafting.  The coronaries were small.  The ramus branch was intramyocardial.  There was diffuse calcified coronary disease.  The mammary artery and vein grafts were prepared for the distal anastomoses. Cardioplegia cannulas were placed for both antegrade and retrograde cold blood cardioplegia.  The patient cooled to 32 degrees.  Aortic crossclamp was applied.  One liter of cold blood cardioplegia was delivered in split doses between the antegrade aortic and retrograde coronary sinus catheters.  There was good cardioplegic arrest and septal temperature dropped less than 12 degrees.  First, distal anastomosis was to the posterior descending branch right coronary.  This had a proximal 90% stenosis.  There was an 1.5-mm vessel.  A reverse saphenous vein was sewn end-to-side with running 7-0 Prolene with good flow through the graft.  The second distal anastomosis was to the OM branch of the left coronary of the left circumflex.  This was an 1.4-mm vessel with proximal 95% stenosis.  A reverse saphenous vein was sewn end-to-side with running 7- 0 Prolene with good flow through the graft.  Cardioplegia was redosed.  The third distal anastomosis was to the diagonal branch to the LAD. This  was an 1.3 mm vessel with proximal and heavy calcified plaque of 95% stenosis.  A reverse saphenous vein of smaller caliber was sewn end- to-side with running 7-0 Prolene with good flow through the graft. Cardioplegia was redosed.  The fourth distal anastomosis was to the ramus intermediate branch of the left coronary.  This was an 1.5-mm vessel.  The right free mammary graft was sewn end-to-side with running 8-0 Prolene with good flow through the graft.  Cardioplegia was redosed.  The fifth distal anastomosis was to the distal LAD.  It was 1.5-mm vessel proximal 95% stenosis.  The left IMA pedicle graft was brought through an opening and the left lateral pericardium was brought down onto the LAD and  sewn end-to-side with running 8-0 Prolene.  There was good flow through the anastomosis after briefly releasing the pedicle bulldog on the mammary pedicle.  The bulldog was reapplied and the pedicle was secured to the epicardium.  Cardioplegia was redosed.  While the crossclamp was still in place, 3 proximal vein anastomoses were performed using a 4.5 mm punch running 6-0 Prolene.  Prior to removing the crossclamp, air was vented from the coronaries with a dose of retrograde warm blood cardioplegia.  The heart resumed a spontaneous rhythm after release of the crossclamp. The vein grafts were de-aired and opened and each had good flow.  The free mammary artery graft was sewn to the proximal portion of the right vein graft using running 7-0 Prolene.  The vascular bulldogs were removed and all the grafts were opened.  The distal and proximal anastomoses were checked and found to be hemostatic.  The patient was rewarmed and reperfused.  Temporary pacing wires were applied.  The lungs were expanded and the heart was filled.  The patient was weaned off cardiopulmonary bypass on low-dose dopamine and milrinone.  Overall LV function  was normal.  Hemodynamics were stable.  The cannulas were removed.  Protamine was administered without adverse reaction.  The mediastinum was irrigated with warm saline.  The superior pericardial fat was closed over the aorta.  Bilateral pleural tubes and the anterior mediastinal tube were placed and brought out through separate incisions. The sternum was closed with interrupted steel wire.  The pectoralis fascia was closed with a running #1 Vicryl.  The subcutaneous and skin layers were closed in running Vicryl.  Sterile dressings were applied. Total bypass time was 147 minutes.     Kerin PernaPeter Van Trigt, M.D.     PV/MEDQ  D:  12/19/2014  T:  12/19/2014  Job:  [id number]

## 2014-12-19 NOTE — Progress Notes (Signed)
Patient ID: Hunter Gomez, male   DOB: 17-Dec-1970, 44 y.o.   MRN: [id number]  SICU Evening Rounds  Hemodynamically stable  Off dopamine and neo  CT output 180 cc today  Good urine output  BMET    Component Value Date/Time   NA 136 12/19/2014 0519   K 3.6 12/19/2014 0519   CL 103 12/19/2014 0519   CO2 24 12/19/2014 0519   GLUCOSE 114* 12/19/2014 0519   BUN 7 12/19/2014 0519   CREATININE 0.76 12/19/2014 0519   CREATININE 0.80 12/04/2014 1119   CALCIUM 7.7* 12/19/2014 0519   GFRNONAA >90 12/19/2014 0519   GFRAA >90 12/19/2014 0519    CBC    Component Value Date/Time   WBC 20.2* 12/19/2014 1605   RBC 3.56* 12/19/2014 1605   HGB 10.1* 12/19/2014 1605   HCT 29.2* 12/19/2014 1605   PLT 232 12/19/2014 1605   MCV 82.0 12/19/2014 1605   MCH 28.4 12/19/2014 1605   MCHC 34.6 12/19/2014 1605   RDW 13.0 12/19/2014 1605   LYMPHSABS 3.4 10/25/2012 1014   MONOABS 0.7 10/25/2012 1014   EOSABS 0.1 10/25/2012 1014   BASOSABS 0.0 10/25/2012 1014

## 2014-12-19 NOTE — Progress Notes (Signed)
Dr. Donata ClayVan Trigt notified of ABG results.

## 2014-12-19 NOTE — Progress Notes (Signed)
PROGRESS NOTE  Subjective:   Hunter Gomez is a 44 yo with hx of severe CAD, S/p CABG on Jan. 25.  (LIMA-LAD, SVG-D, SVG-OM, Free RIMA-Ramus, SVG-PD)       Objective:    Vital Signs:   Temp:  [96.8 F (36 C)-98.6 F (37 C)] 98.2 F (36.8 C) (01/26 1100) Pulse Rate:  [71-95] 72 (01/26 1200) Resp:  [12-26] 20 (01/26 1200) BP: (95-118)/(49-74) 95/57 mmHg (01/26 1200) SpO2:  [97 %-100 %] 99 % (01/26 1200) Arterial Line BP: (93-140)/(51-73) 112/55 mmHg (01/26 1200) FiO2 (%):  [40 %-50 %] 40 % (01/26 0120) Weight:  [232 lb 1.6 oz (105.28 kg)] 232 lb 1.6 oz (105.28 kg) (01/26 0500)  Last BM Date: 12/15/14   24-hour weight change: Weight change:   Weight trends: Filed Weights   12/16/14 0020 12/18/14 1000 12/19/14 0500  Weight: 223 lb 8.7 oz (101.4 kg) 224 lb 13.9 oz (102 kg) 232 lb 1.6 oz (105.28 kg)    Intake/Output:  01/25 0701 - 01/26 0700 In: 5482.3 [I.V.:3340.3; Blood:1042; IV Piggyback:1100] Out: 5015 [Urine:3425; Blood:1050; Chest Tube:540] Total I/O In: 957.8 [P.O.:480; I.V.:477.8] Out: 1705 [Urine:1475; Chest Tube:230]   Physical Exam: BP 95/57 mmHg  Pulse 72  Temp(Src) 98.2 F (36.8 C) (Core (Comment))  Resp 20  Ht  (1.727 m)  Wt 232 lb 1.6 oz (105.28 kg)  BMI 35.30 kg/m2  SpO2 99%  Wt Readings from Last 3 Encounters:  12/19/14 232 lb 1.6 oz (105.28 kg)  12/04/14 218 lb (98.884 kg)  12/22/13 206 lb (93.441 kg)    General: Vital signs reviewed and noted.   Head: Normocephalic, atraumatic.  Eyes: conjunctivae/corneas clear.  EOM's intact.   Throat: normal  Neck:  normal, central lines in place   Lungs:     Clear anteriorly   Heart:   RR   Abdomen:  Soft, non-tender, non-distended    Extremities: Trace edema    Neurologic: A&O X3, CN II - XII are grossly intact.   Psych: Normal     Labs: BMET:  Recent Labs  12/18/14 1040  12/19/14 0126 12/19/14 0133 12/19/14 0519  NA 138  < >  --  136 136  K 4.1  < >  --  4.0 3.6  CL 104   < >  --  100 103  CO2 29  --   --   --  24  GLUCOSE 102*  < >  --  133* 114*  BUN 10  < >  --  7 7  CREATININE 0.79  < > 0.68 0.60 0.76  CALCIUM 8.9  --   --   --  7.7*  MG  --   --  2.8*  --  2.2  < > = values in this interval not displayed.  Liver function tests:  Recent Labs  12/18/14 1040  AST 28  ALT 34  ALKPHOS 83  BILITOT 0.6  PROT 6.7  ALBUMIN 3.9   No results for input(s): LIPASE, AMYLASE in the last 72 hours.  CBC:  Recent Labs  12/19/14 0126 12/19/14 0133 12/19/14 0519  WBC 23.6*  --  19.2*  HGB 11.5* 11.6* 10.2*  HCT 32.8* 34.0* 29.9*  MCV 82.6  --  81.9  PLT 276  --  250    Cardiac Enzymes:  Recent Labs  12/19/14 1000  TROPONINI 4.22*    Coagulation Studies:  Recent Labs  12/18/14 0241 12/18/14 1910  LABPROT 15.1 15.9*  INR 1.18  1.25    Other: Invalid input(s): POCBNP No results for input(s): DDIMER in the last 72 hours. No results for input(s): HGBA1C in the last 72 hours. No results for input(s): CHOL, HDL, LDLCALC, TRIG, CHOLHDL in the last 72 hours. No results for input(s): TSH, T4TOTAL, T3FREE, THYROIDAB in the last 72 hours.  Invalid input(s): FREET3 No results for input(s): VITAMINB12, FOLATE, FERRITIN, TIBC, IRON, RETICCTPCT in the last 72 hours.   Other results:  Tele  NSR at 81.   Medications:    Infusions: . sodium chloride    . sodium chloride 20 mL/hr at 12/19/14 1200  . insulin (NOVOLIN-R) infusion 3.4 Units/hr (12/19/14 1200)  . lactated ringers 20 mL/hr at 12/19/14 1200  . nitroGLYCERIN    . phenylephrine (NEO-SYNEPHRINE) Adult infusion 15.067 mcg/min (12/19/14 1200)    Scheduled Medications: . acetaminophen  1,000 mg Oral 4 times per day   Or  . acetaminophen (TYLENOL) oral liquid 160 mg/5 mL  1,000 mg Per Tube 4 times per day  . antiseptic oral rinse  7 mL Mouth Rinse QID  . aspirin EC  325 mg Oral Daily   Or  . aspirin  324 mg Per Tube Daily  . atorvastatin  80 mg Oral q1800  . bisacodyl  10 mg  Oral Daily   Or  . bisacodyl  10 mg Rectal Daily  . cefUROXime (ZINACEF)  IV  1.5 g Intravenous Q12H  . docusate sodium  200 mg Oral Daily  . furosemide  20 mg Intravenous BID  . insulin aspart  0-24 Units Subcutaneous 6 times per day  . insulin detemir  10 Units Subcutaneous Daily  . insulin regular  0-10 Units Intravenous TID WC  . [START ON 12/20/2014] metoprolol tartrate  12.5 mg Oral BID   Or  . [START ON 12/20/2014] metoprolol tartrate  12.5 mg Per Tube BID  . [START ON 12/20/2014] omega-3 acid ethyl esters  1 g Oral BID  . [START ON 12/20/2014] pantoprazole  40 mg Oral Daily  . sodium chloride  3 mL Intravenous Q12H    Assessment/ Plan:   :    NSTEMI (non-ST elevated myocardial infarction)   S/P CABG x 5 Doing well after CABG.   Hemodynamics are stable. Having the usual symptoms of soreness.  Continue current meds.   Disposition:  Length of Stay: 4  Vesta MixerPhilip J. Nahser, Montez HagemanJr., MD, [facility] HospitalFACC 12/19/2014, 1:15 PM Office [phone] Pager [phone]

## 2014-12-19 NOTE — Progress Notes (Signed)
Received Critical Value Troponin I 4.22 from lab. Results called to Dr. Donata ClayVan Trigt.

## 2014-12-19 NOTE — Progress Notes (Signed)
Received critical lab result CKMB 36.7 from lab. Dr. Laneta SimmersBartle notified.

## 2014-12-19 NOTE — Plan of Care (Signed)
Problem: Phase II - Intermediate Post-Op Goal: Wean to Extubate Outcome: Completed/Met Date Met:  12/19/14 Pt extubated, continues on 4L via [place] Goal: Maintain Hemodynamic Stability Outcome: Progressing Continues on dopamine and Neo drips Goal: CBGs/Blood Glucose per SCIP Criteria Outcome: Progressing Remains on glucostabilizer    Goal: Pain controlled with appropriate interventions Outcome: Progressing Pt denies pain at this time  Goal: Advance Diet Outcome: Progressing Tolerating ice chips

## 2014-12-20 ENCOUNTER — Inpatient Hospital Stay (HOSPITAL_COMMUNITY): Payer: BLUE CROSS/BLUE SHIELD

## 2014-12-20 LAB — GLUCOSE, CAPILLARY
Glucose-Capillary: 124 mg/dL — ABNORMAL HIGH (ref 70–99)
Glucose-Capillary: 175 mg/dL — ABNORMAL HIGH (ref 70–99)
Glucose-Capillary: 129 mg/dL — ABNORMAL HIGH (ref 70–99)

## 2014-12-20 LAB — CBC
HCT: 28.9 % — ABNORMAL LOW (ref 39.0–52.0)
Hemoglobin: 9.6 g/dL — ABNORMAL LOW (ref 13.0–17.0)
MCH: 27.7 pg (ref 26.0–34.0)
MCHC: 33.2 g/dL (ref 30.0–36.0)
MCV: 83.5 fL (ref 78.0–100.0)
Platelets: 216 10*3/uL (ref 150–400)
RBC: 3.46 MIL/uL — ABNORMAL LOW (ref 4.22–5.81)
RDW: 13.4 % (ref 11.5–15.5)
WBC: 19.3 10*3/uL — AB (ref 4.0–10.5)

## 2014-12-20 LAB — BASIC METABOLIC PANEL
Anion gap: 7 (ref 5–15)
BUN: 12 mg/dL (ref 6–23)
CALCIUM: 8.4 mg/dL (ref 8.4–10.5)
CO2: 29 mmol/L (ref 19–32)
Chloride: 99 mmol/L (ref 96–112)
Creatinine, Ser: 0.81 mg/dL (ref 0.50–1.35)
GFR calc Af Amer: >90 mL/min (ref >90)
GFR calc non Af Amer: >90 mL/min (ref >90)
Glucose, Bld: 128 mg/dL — ABNORMAL HIGH (ref 70–99)
POTASSIUM: 4.2 mmol/L (ref 3.5–5.1)
SODIUM: 135 mmol/L (ref 135–145)

## 2014-12-20 LAB — TROPONIN I: Troponin I: 2.54 ng/mL (ref <0.031)

## 2014-12-20 MED ORDER — SODIUM CHLORIDE 0.9 % IJ SOLN
3.0000 mL | Freq: Two times a day (BID) | INTRAMUSCULAR | Status: DC
  Administered 2014-12-21 – 2014-12-22 (×4): 3 mL via INTRAVENOUS

## 2014-12-20 MED ORDER — FUROSEMIDE 10 MG/ML IJ SOLN
40.0000 mg | Freq: Once | INTRAMUSCULAR | Status: AC
  Administered 2014-12-20: 40 mg via INTRAVENOUS
  Filled 2014-12-20: qty 4

## 2014-12-20 MED ORDER — POTASSIUM CHLORIDE 10 MEQ/50ML IV SOLN
10.0000 meq | INTRAVENOUS | Status: AC
  Administered 2014-12-20 (×2): 10 meq via INTRAVENOUS
  Filled 2014-12-20 (×2): qty 50

## 2014-12-20 MED ORDER — KETOROLAC TROMETHAMINE 15 MG/ML IJ SOLN
15.0000 mg | Freq: Four times a day (QID) | INTRAMUSCULAR | Status: DC
  Administered 2014-12-20 – 2014-12-21 (×5): 15 mg via INTRAVENOUS
  Filled 2014-12-20 (×8): qty 1

## 2014-12-20 MED ORDER — ALPRAZOLAM 0.25 MG PO TABS
0.2500 mg | ORAL_TABLET | Freq: Four times a day (QID) | ORAL | Status: DC | PRN
  Administered 2014-12-22: 0.25 mg via ORAL
  Filled 2014-12-20: qty 1

## 2014-12-20 MED ORDER — FUROSEMIDE 40 MG PO TABS
40.0000 mg | ORAL_TABLET | Freq: Every day | ORAL | Status: DC
  Administered 2014-12-21 – 2014-12-23 (×3): 40 mg via ORAL
  Filled 2014-12-20 (×4): qty 1

## 2014-12-20 MED ORDER — POTASSIUM CHLORIDE CRYS ER 20 MEQ PO TBCR
20.0000 meq | EXTENDED_RELEASE_TABLET | Freq: Two times a day (BID) | ORAL | Status: DC
  Administered 2014-12-21 – 2014-12-23 (×5): 20 meq via ORAL
  Filled 2014-12-20 (×6): qty 1

## 2014-12-20 MED ORDER — SODIUM CHLORIDE 0.9 % IJ SOLN
3.0000 mL | INTRAMUSCULAR | Status: DC | PRN
  Administered 2014-12-21: 3 mL via INTRAVENOUS
  Filled 2014-12-20: qty 3

## 2014-12-20 MED ORDER — MOVING RIGHT ALONG BOOK
Freq: Once | Status: AC
  Administered 2014-12-20: 19:00:00
  Filled 2014-12-20: qty 1

## 2014-12-20 MED ORDER — SODIUM CHLORIDE 0.9 % IV SOLN: 250.0000 mL | INTRAVENOUS | Status: DC | PRN

## 2014-12-20 MED ORDER — MAGNESIUM HYDROXIDE 400 MG/5ML PO SUSP: 30.0000 mL | Freq: Every day | ORAL | Status: DC | PRN

## 2014-12-20 MED ORDER — INSULIN ASPART 100 UNIT/ML ~~LOC~~ SOLN: 0.0000 [IU] | Freq: Three times a day (TID) | SUBCUTANEOUS | Status: DC

## 2014-12-20 NOTE — Progress Notes (Signed)
Patient ID: Hunter Gomez Noa, male   DOB: [birth year], 44 y.o.   MRN: [id number] EVENING ROUNDS NOTE :     [address]       [place] [id number]             [phone]                 2 Days Post-Op Procedure(s) (LRB): CORONARY ARTERY BYPASS GRAFTING (CABG), ON PUMP, TIMES FIVE, USING BILATERAL INTERNAL MAMMARY ARTERIES, RIGHT GREATER SAPHENOUS VEIN HARVESTED ENDOSCOPICALLY (N/A) INTRAOPERATIVE TRANSESOPHAGEAL ECHOCARDIOGRAM (N/A)  Total Length of Stay:  LOS: 5 days  BP 119/73 mmHg  Pulse 99  Temp(Src) 97.5 F (36.4 C) (Oral)  Resp 30  Ht 5\' 8"  (1.727 m)  Wt 229 lb 0.9 oz (103.9 kg)  BMI 34.84 kg/m2  SpO2 96%  .Intake/Output      01/26 0701 - 01/27 0700 01/27 0701 - 01/28 0700   P.O. 840 240   I.V. (mL/kg) 914.4 (8.8)    Blood     IV Piggyback 100    Total Intake(mL/kg) 1854.4 (17.8) 240 (2.3)   Urine (mL/kg/hr) 3140 (1.3) 430 (0.4)   Blood     Chest Tube 590 (0.2) 160 (0.1)   Total Output 3730 590   Net -1875.6 -350          . sodium chloride    . sodium chloride Stopped (12/19/14 1400)  . phenylephrine (NEO-SYNEPHRINE) Adult infusion Stopped (12/19/14 1700)     Lab Results  Component Value Date   WBC 19.3* 12/20/2014   HGB 9.6* 12/20/2014   HCT 28.9* 12/20/2014   PLT 216 12/20/2014   GLUCOSE 128* 12/20/2014   CHOL 209* 12/04/2014   TRIG 262* 12/04/2014   HDL 31* 12/04/2014   LDLCALC 126* 12/04/2014   ALT 34 12/18/2014   AST 28 12/18/2014   NA 135 12/20/2014   K 4.2 12/20/2014   CL 99 12/20/2014   CREATININE 0.81 12/20/2014   BUN 12 12/20/2014   CO2 29 12/20/2014   TSH 2.366 12/04/2014   INR 1.25 12/18/2014   HGBA1C 5.5 12/16/2014   Stable day,waiting for stepdown bed  Delight OvensEdward B Francisca Harbuck MD  Beeper [phone] Office [phone] 12/20/2014 6:49 PM

## 2014-12-20 NOTE — Progress Notes (Signed)
2 Days Post-Op Procedure(s) (LRB): CORONARY ARTERY BYPASS GRAFTING (CABG), ON PUMP, TIMES FIVE, USING BILATERAL INTERNAL MAMMARY ARTERIES, RIGHT GREATER SAPHENOUS VEIN HARVESTED ENDOSCOPICALLY (N/A) INTRAOPERATIVE TRANSESOPHAGEAL ECHOCARDIOGRAM (N/A) Subjective: Postop day 2 emergent CABG with bilateral IMA grafts--critical left main with three-vessel CAD The patient is stable out of bed to chair off drips maintaining sinus rhythm Chest tube drainage 300 cc last 12 hours-leave the left pleural tube today  Objective: Vital signs in last 24 hours: Temp:  [97.9 F (36.6 C)-98.2 F (36.8 C)] 97.9 F (36.6 C) (01/27 0700) Pulse Rate:  [72-99] 99 (01/27 0700) Cardiac Rhythm:  [-] Normal sinus rhythm (01/27 0600) Resp:  [15-29] 23 (01/27 0700) BP: (95-126)/(49-79) 126/79 mmHg (01/27 0700) SpO2:  [94 %-100 %] 94 % (01/27 0700) Arterial Line BP: (104-120)/(50-59) 104/51 mmHg (01/26 1600) Weight:  [229 lb 0.9 oz (103.9 kg)] 229 lb 0.9 oz (103.9 kg) (01/27 0600)  Hemodynamic parameters for last 24 hours: PAP: (34)/(13) 34/13 mmHg  Intake/Output from previous day: 01/26 0701 - 01/27 0700 In: 1854.4 [P.O.:840; I.V.:914.4; IV Piggyback:100] Out: 3730 [Urine:3140; Chest Tube:590] Intake/Output this shift: Total I/O In: -  Out: 140 [Urine:80; Chest Tube:60]  Alert and comfortable Breath sounds diminished Abdomen mildly distended Extremities warm  Lab Results:  Recent Labs  12/19/14 1605 12/19/14 1613 12/20/14 0503  WBC 20.2*  --  19.3*  HGB 10.1* 10.5* 9.6*  HCT 29.2* 31.0* 28.9*  PLT 232  --  216   BMET:  Recent Labs  12/19/14 0519  12/19/14 1613 12/20/14 0503  NA 136  --  133* 135  K 3.6  --  4.0 4.2  CL 103  --  99 99  CO2 24  --   --  29  GLUCOSE 114*  --  127* 128*  BUN 7  --  10 12  CREATININE 0.76  < > 0.60 0.81  CALCIUM 7.7*  --   --  8.4  < > = values in this interval not displayed.  PT/INR:  Recent Labs  12/18/14 1910  LABPROT 15.9*  INR 1.25   ABG    Component Value Date/Time   PHART 7.324* 12/19/2014 0353   HCO3 23.7 12/19/2014 0353   TCO2 24 12/19/2014 1613   ACIDBASEDEF 3.0* 12/19/2014 0353   O2SAT 99.0 12/19/2014 0353   CBG (last 3)   Recent Labs  12/19/14 2323 12/20/14 0357 12/20/14 0851  GLUCAP 115* 124* 175*    Assessment/Plan: S/P Procedure(s) (LRB): CORONARY ARTERY BYPASS GRAFTING (CABG), ON PUMP, TIMES FIVE, USING BILATERAL INTERNAL MAMMARY ARTERIES, RIGHT GREATER SAPHENOUS VEIN HARVESTED ENDOSCOPICALLY (N/A) INTRAOPERATIVE TRANSESOPHAGEAL ECHOCARDIOGRAM (N/A) Remove lines and transfer to step down Lasix 40 mg IV Continue Lantus and sliding scale insulin for glucose control   LOS: 5 days    VAN TRIGT III,PETER 12/20/2014

## 2014-12-21 ENCOUNTER — Inpatient Hospital Stay (HOSPITAL_COMMUNITY): Payer: BLUE CROSS/BLUE SHIELD

## 2014-12-21 LAB — GLUCOSE, CAPILLARY
GLUCOSE-CAPILLARY: 116 mg/dL — AB (ref 70–99)
GLUCOSE-CAPILLARY: 152 mg/dL — AB (ref 70–99)
Glucose-Capillary: 122 mg/dL — ABNORMAL HIGH (ref 70–99)
Glucose-Capillary: 111 mg/dL — ABNORMAL HIGH (ref 70–99)

## 2014-12-21 LAB — CBC
HCT: 27.5 % — ABNORMAL LOW (ref 39.0–52.0)
Hemoglobin: 9.1 g/dL — ABNORMAL LOW (ref 13.0–17.0)
MCH: 28.1 pg (ref 26.0–34.0)
MCHC: 33.1 g/dL (ref 30.0–36.0)
MCV: 84.9 fL (ref 78.0–100.0)
Platelets: 213 10*3/uL (ref 150–400)
RBC: 3.24 MIL/uL — ABNORMAL LOW (ref 4.22–5.81)
RDW: 13.4 % (ref 11.5–15.5)
WBC: 15.1 10*3/uL — ABNORMAL HIGH (ref 4.0–10.5)

## 2014-12-21 LAB — BASIC METABOLIC PANEL
Anion gap: 7 (ref 5–15)
BUN: 17 mg/dL (ref 6–23)
CO2: 32 mmol/L (ref 19–32)
Calcium: 8.6 mg/dL (ref 8.4–10.5)
Chloride: 94 mmol/L — ABNORMAL LOW (ref 96–112)
Creatinine, Ser: 0.81 mg/dL (ref 0.50–1.35)
GFR calc Af Amer: >90 mL/min (ref >90)
GFR calc non Af Amer: >90 mL/min (ref >90)
Glucose, Bld: 121 mg/dL — ABNORMAL HIGH (ref 70–99)
Potassium: 4.4 mmol/L (ref 3.5–5.1)
Sodium: 133 mmol/L — ABNORMAL LOW (ref 135–145)

## 2014-12-21 MED ORDER — METOPROLOL TARTRATE 12.5 MG HALF TABLET
12.5000 mg | ORAL_TABLET | Freq: Two times a day (BID) | ORAL | Status: DC
  Administered 2014-12-21 (×2): 12.5 mg via ORAL
  Filled 2014-12-21 (×4): qty 1

## 2014-12-21 MED ORDER — METOPROLOL TARTRATE 25 MG PO TABS: 25.0000 mg | ORAL_TABLET | Freq: Two times a day (BID) | ORAL | Status: DC

## 2014-12-21 NOTE — Progress Notes (Addendum)
[facility] DAILY ICU PROGRESS NOTE                   [address]            [place] [address]          [phone]   3 Days Post-Op Procedure(s) (LRB): CORONARY ARTERY BYPASS GRAFTING (CABG), ON PUMP, TIMES FIVE, USING BILATERAL INTERNAL MAMMARY ARTERIES, RIGHT GREATER SAPHENOUS VEIN HARVESTED ENDOSCOPICALLY (N/A) INTRAOPERATIVE TRANSESOPHAGEAL ECHOCARDIOGRAM (N/A)  Total Length of Stay:  LOS: 6 days   Subjective: Patient's incisional pain under better control. Passing flatus but no bowel movement yet.  Objective: Vital signs in last 24 hours: Temp:  [97.5 F (36.4 C)-97.9 F (36.6 C)] 97.8 F (36.6 C) (01/28 0723) Pulse Rate:  [82-102] 84 (01/28 0600) Cardiac Rhythm:  [-] Normal sinus rhythm (01/28 0600) Resp:  [12-30] 21 (01/28 0600) BP: (104-134)/(53-74) 104/53 mmHg (01/28 0400) SpO2:  [90 %-100 %] 93 % (01/28 0600) FiO2 (%):  [28 %] 28 % (01/27 2055) Weight:  [228 lb (103.42 kg)] 228 lb (103.42 kg) (01/28 0500)  Filed Weights   12/19/14 0500 12/20/14 0600 12/21/14 0500  Weight: 232 lb 1.6 oz (105.28 kg) 229 lb 0.9 oz (103.9 kg) 228 lb (103.42 kg)    Pre op weight: 102 kg     Intake/Output from previous day: 01/27 0701 - 01/28 0700 In: 770 [P.O.:720; IV Piggyback:50] Out: 1535 [Urine:1205; Chest Tube:330]  Intake/Output this shift:    Current Meds: Scheduled Meds: . acetaminophen  1,000 mg Oral 4 times per day   Or  . acetaminophen (TYLENOL) oral liquid 160 mg/5 mL  1,000 mg Per Tube 4 times per day  . antiseptic oral rinse  7 mL Mouth Rinse BID  . aspirin EC  325 mg Oral Daily   Or  . aspirin  324 mg Per Tube Daily  . atorvastatin  80 mg Oral q1800  . bisacodyl  10 mg Oral Daily   Or  . bisacodyl  10 mg Rectal Daily  . docusate sodium  200 mg Oral Daily  . furosemide  40 mg Oral Daily  . insulin aspart  0-24 Units Subcutaneous TID AC & HS  . insulin detemir  10 Units Subcutaneous Daily  . ketorolac  15 mg Intravenous 4 times per day    . metoprolol tartrate  12.5 mg Oral BID   Or  . metoprolol tartrate  12.5 mg Per Tube BID  . omega-3 acid ethyl esters  1 g Oral BID  . pantoprazole  40 mg Oral Daily  . potassium chloride  20 mEq Oral BID  . sodium chloride  3 mL Intravenous Q12H  . sodium chloride  3 mL Intravenous Q12H   Continuous Infusions: . sodium chloride    . sodium chloride Stopped (12/19/14 1400)  . phenylephrine (NEO-SYNEPHRINE) Adult infusion Stopped (12/19/14 1700)   PRN Meds:.sodium chloride, ALPRAZolam, magnesium hydroxide, metoprolol, ondansetron (ZOFRAN) IV, oxyCODONE, sodium chloride, sodium chloride, traMADol  General appearance: alert, cooperative and no distress Neurologic: intact Heart: RRR Lungs: Slightly diminished at bases Abdomen: soft, non-tender; bowel sounds normal; no masses,  no organomegaly Extremities: Mild LE edema;brusing right LE Wound: Aquacel dressing intact;RLE dresings are clean and dry  Lab Results: CBC:  Recent Labs  12/20/14 0503 12/21/14 0344  WBC 19.3* 15.1*  HGB 9.6* 9.1*  HCT 28.9* 27.5*  PLT 216 213   BMET:   Recent Labs  12/20/14 0503 12/21/14 0344  NA 135 133*  K 4.2 4.4  CL 99 94*  CO2 29 32  GLUCOSE 128* 121*  BUN 12 17  CREATININE 0.81 0.81  CALCIUM 8.4 8.6    PT/INR:   Recent Labs  12/18/14 1910  LABPROT 15.9*  INR 1.25   Radiology: Dg Chest Port 1 View  12/20/2014   CLINICAL DATA:  Status post CABG on December 18, 2014  EXAM: PORTABLE CHEST - 1 VIEW  COMPARISON:  Portable chest x-ray of December 19, 2013  FINDINGS: The lung volumes are low. The bilateral chest tubes are unchanged in position. There is no pneumothorax nor large pleural effusion. The cardiac silhouette remains enlarged. The pulmonary vascularity is only minimally prominent. The mediastinal drainage tube is stable in position. The Swan-Ganz catheter has been removed. There are 7 intact sternal wires demonstrated.  IMPRESSION: Hypoinflation more prominent than on  yesterday's study with persistent bibasilar atelectasis. There is no pneumothorax nor large pleural effusion. The remaining support tubes are in appropriate position radiographically.   Electronically Signed   By: David  [place]az[place]   On: 12/20/2014 08:01     Assessment/Plan: S/P Procedure(s) (LRB): CORONARY ARTERY BYPASS GRAFTING (CABG), ON PUMP, TIMES FIVE, USING BILATERAL INTERNAL MAMMARY ARTERIES, RIGHT GREATER SAPHENOUS VEIN HARVESTED ENDOSCOPICALLY (N/A) INTRAOPERATIVE TRANSESOPHAGEAL ECHOCARDIOGRAM (N/A)  1.CV- SR in the 70's. On Lopressor 12.5 bid. Will start ACE when BP allows 2.Pulmonary-On 2 liters of oxygen via Kemp. Wean as tolerates. Chest tubes with 310 cc since surgery.  CXR shows bibasilar atelectasis and small pleural effusions, cardiomegaly, no pneumothorax. Encourage incentive spirometer 3.Volume Overload-Lasix 40 daily 4. ABL anemia-H and H 9.1 and 27.5 5. CBGs 175/129/116. Pre op HGA1C 5.5. Will stop accu checks and SS once transferred to 2000/PCTU 6. Transfer to 2000  ZIMMERMAN,DONIELLE M PA-C 12/21/2014 7:28 AM DC remaining L pleural tube Expected postop blood loss anemia Transfer to telemetry bed 2 west DC EPWs tomorrow if no afib  patient examined and medical record reviewed,agree with above note. VAN TRIGT III,Lorella Gomez 12/21/2014

## 2014-12-21 NOTE — Anesthesia Postprocedure Evaluation (Signed)
  Anesthesia Post-op Note  Patient: Hunter CloudBradley Breed  Procedure(s) Performed: Procedure(s) with comments: CORONARY ARTERY BYPASS GRAFTING (CABG), ON PUMP, TIMES FIVE, USING BILATERAL INTERNAL MAMMARY ARTERIES, RIGHT GREATER SAPHENOUS VEIN HARVESTED ENDOSCOPICALLY (N/A) - LIMA-LAD, SVG-D, SVG-OM, Free RIMA-Ramus, SVG-PD INTRAOPERATIVE TRANSESOPHAGEAL ECHOCARDIOGRAM (N/A)  Patient Location: SICU  Anesthesia Type:General  Level of Consciousness: sedated and Patient remains intubated per anesthesia plan  Airway and Oxygen Therapy: Patient remains intubated per anesthesia plan and Patient placed on Ventilator (see vital sign flow sheet for setting)  Post-op Pain: none  Post-op Assessment: Post-op Vital signs reviewed, Patient's Cardiovascular Status Stable, Respiratory Function Stable, Patent Airway, No signs of Nausea or vomiting and Pain level controlled  Post-op Vital Signs: Reviewed and stable  Last Vitals:  Filed Vitals:   12/21/14 1718  BP: 126/70  Pulse:   Temp: 36.9 C  Resp: 18    Complications: No apparent anesthesia complications

## 2014-12-22 ENCOUNTER — Encounter (HOSPITAL_COMMUNITY): Payer: Self-pay | Admitting: Surgical

## 2014-12-22 ENCOUNTER — Inpatient Hospital Stay (HOSPITAL_COMMUNITY): Payer: BLUE CROSS/BLUE SHIELD

## 2014-12-22 LAB — BASIC METABOLIC PANEL
Anion gap: 6 (ref 5–15)
BUN: 13 mg/dL (ref 6–23)
CALCIUM: 8.6 mg/dL (ref 8.4–10.5)
CO2: 31 mmol/L (ref 19–32)
Chloride: 101 mmol/L (ref 96–112)
Creatinine, Ser: 0.73 mg/dL (ref 0.50–1.35)
GFR calc non Af Amer: >90 mL/min (ref >90)
GLUCOSE: 107 mg/dL — AB (ref 70–99)
Potassium: 4.2 mmol/L (ref 3.5–5.1)
SODIUM: 138 mmol/L (ref 135–145)

## 2014-12-22 LAB — TYPE AND SCREEN
ABO/RH(D): A POS
Antibody Screen: NEGATIVE
Unit division: 0
Unit division: 0

## 2014-12-22 LAB — BLOOD PRODUCT ORDER (VERBAL) VERIFICATION

## 2014-12-22 MED ORDER — LISINOPRIL 10 MG PO TABS
10.0000 mg | ORAL_TABLET | Freq: Every day | ORAL | Status: DC
  Administered 2014-12-22 – 2014-12-23 (×2): 10 mg via ORAL
  Filled 2014-12-22 (×2): qty 1

## 2014-12-22 MED ORDER — METOPROLOL TARTRATE 25 MG PO TABS
25.0000 mg | ORAL_TABLET | Freq: Two times a day (BID) | ORAL | Status: DC
  Administered 2014-12-22 (×2): 25 mg via ORAL
  Filled 2014-12-22 (×4): qty 1

## 2014-12-22 NOTE — Progress Notes (Signed)
CARDIAC REHAB PHASE I   PRE:  Rate/Rhythm: 108 ST    BP: sitting 150/92    SaO2: 95 RA  MODE:  Ambulation: 550 ft   POST:  Rate/Rhythm: 127 ST    BP: sitting 166/90     SaO2: 97 RA  Pt stood independently and walked independently. No major c/o except feeling hot at times. Got pt fan for room. HR and BP elevated. Has not received lisinopril yet. Ed completed with pt and wife. Voiced understanding and [facility] Gave [facility] booklet and video to watch. To walk independently today. [phone]   Elissa LovettReeve, [place] CrestlineKristan CES, ACSM 12/22/2014 10:50 AM

## 2014-12-22 NOTE — Discharge Instructions (Signed)
Endoscopic Saphenous Vein Harvesting °Care After °Refer to this sheet in the next few weeks. These instructions provide you with information on caring for yourself after your procedure. Your health care provider may also give you more specific instructions. Your treatment has been planned according to current medical practices, but problems sometimes occur. Call your health care provider if you have any problems or questions after your procedure. °HOME CARE INSTRUCTIONS °Medicine °· Take whatever pain medicine your surgeon prescribes. Follow the directions carefully. Do not take over-the-counter pain medicine unless your surgeon says it is okay. Some pain medicine can cause bleeding problems for several weeks after surgery. °· Follow your surgeon's instructions about driving. You will probably not be permitted to drive after heart surgery. °· Take any medicines your surgeon prescribes. Any medicines you took before your heart surgery should be checked with your health care provider before you start taking them again. °Wound care °· If your surgeon has prescribed an elastic bandage or stocking, ask how long you should wear it. °· Check the area around your surgical cuts (incisions) whenever your bandages (dressings) are changed. Look for any redness or swelling. °· You will need to return to have the stitches (sutures) or staples taken out. Ask your surgeon when to do that. °· Ask your surgeon when you can shower or bathe. °Activity °· Try to keep your legs raised when you are sitting. °· Do any exercises your health care providers have given you. These may include deep breathing exercises, coughing, walking, or other exercises. °SEEK MEDICAL CARE IF: °· You have any questions about your medicines. °· You have more leg pain, especially if your pain medicine stops working. °· New or growing bruises develop on your leg. °· Your leg swells, feels tight, or becomes red. °· You have numbness in your leg. °SEEK IMMEDIATE  MEDICAL CARE IF: °· Your pain gets much worse. °· Blood or fluid leaks from any of the incisions. °· Your incisions become warm, swollen, or red. °· You have chest pain. °· You have trouble breathing. °· You have a fever. °· You have more pain near your leg incision. °MAKE SURE YOU: °· Understand these instructions. °· Will watch your condition. °· Will get help right away if you are not doing well or get worse. °Document Released: 07/23/2011 Document Revised: 11/15/2013 Document Reviewed: 07/23/2011 °ExitCare® Patient Information ©2015 [employer]. This information is not intended to replace advice given to you by your health care provider. Make sure you discuss any questions you have with your health care provider. °Coronary Artery Bypass Grafting, Care After °These instructions give you information on caring for yourself after your procedure. Your doctor may also give you more specific instructions. Call your doctor if you have any problems or questions after your procedure.  °HOME CARE °· Only take medicine as told by your doctor. Take medicines exactly as told. Do not stop taking medicines or start any new medicines without talking to your doctor first. °· Take your pulse as told by your doctor. °· Do deep breathing as told by your doctor. Use your breathing device (incentive spirometer), if given, to practice deep breathing several times a day. Support your chest with a pillow or your arms when you take deep breaths or cough. °· Keep the area clean, dry, and protected where the surgery cuts (incisions) were made. Remove bandages (dressings) only as told by your doctor. If strips were applied to surgical area, do not take them off. They fall off   on their own. °· Check the surgery area daily for puffiness (swelling), redness, or leaking fluid. °· If surgery cuts were made in your legs: °· Avoid crossing your legs. °· Avoid sitting for long periods of time. Change positions every 30 minutes. °· Raise your legs  when you are sitting. Place them on pillows. °· Wear stockings that help keep blood clots from forming in your legs (compression stockings). °· Only take sponge baths until your doctor says it is okay to take showers. Pat the surgery area dry. Do not rub the surgery area with a washcloth or towel. Do not bathe, swim, or use a hot tub until your doctor says it is okay. °· Eat foods that are high in fiber. These include raw fruits and vegetables, whole grains, beans, and nuts. Choose lean meats. Avoid canned, processed, and fried foods. °· Drink enough fluids to keep your pee (urine) clear or pale yellow. °· Weigh yourself every day. °· Rest and limit activity as told by your doctor. You may be told to: °· Stop any activity if you have chest pain, shortness of breath, changes in heartbeat, or dizziness. Get help right away if this happens. °· Move around often for short amounts of time or take short walks as told by your doctor. Gradually become more active. You may need help to strengthen your muscles and build endurance. °· Avoid lifting, pushing, or pulling anything heavier than 10 pounds (4.5 kg) for at least 6 weeks after surgery. °· Do not drive until your doctor says it is okay. °· Ask your doctor when you can go back to work. °· Ask your doctor when you can begin sexual activity again. °· Follow up with your doctor as told. °GET HELP IF: °· You have puffiness, redness, more pain, or fluid draining from the incision site. °· You have a fever. °· You have puffiness in your ankles or legs. °· You have pain in your legs. °· You gain 2 or more pounds (0.9 kg) a day. °· You feel sick to your stomach (nauseous) or throw up (vomit). °· You have watery poop (diarrhea). °GET HELP RIGHT AWAY IF: °· You have chest pain that goes to your jaw or arms. °· You have shortness of breath. °· You have a fast or irregular heartbeat. °· You notice a "clicking" in your breastbone when you move. °· You have numbness or weakness in  your arms or legs. °· You feel dizzy or light-headed. °MAKE SURE YOU: °· Understand these instructions. °· Will watch your condition. °· Will get help right away if you are not doing well or get worse. °Document Released: 11/15/2013 Document Reviewed: 11/15/2013 °ExitCare® Patient Information ©2015 [employer]. This information is not intended to replace advice given to you by your health care provider. Make sure you discuss any questions you have with your health care provider. ° °

## 2014-12-22 NOTE — Progress Notes (Signed)
EPW discontinued per MD orders and per protocol. Wires intact and patient tolerated procedure well.  Patient verbalized understanding of bedrest x 1 hour. Call bell within reach and patient instructed to call RN with any new concerns and/or issues.  

## 2014-12-22 NOTE — Progress Notes (Addendum)
[address]       [place] [id number]             [phone]      4 Days Post-Op Procedure(s) (LRB): CORONARY ARTERY BYPASS GRAFTING (CABG), ON PUMP, TIMES FIVE, USING BILATERAL INTERNAL MAMMARY ARTERIES, RIGHT GREATER SAPHENOUS VEIN HARVESTED ENDOSCOPICALLY (N/A) INTRAOPERATIVE TRANSESOPHAGEAL ECHOCARDIOGRAM (N/A) Subjective: Feels uncomfortable in the bed but overall doing ok  Objective: Vital signs in last 24 hours: Temp:  [97.8 F (36.6 C)-99.1 F (37.3 C)] 98.8 F (37.1 C) (01/29 0500) Pulse Rate:  [77-110] 98 (01/29 0500) Cardiac Rhythm:  [-] Normal sinus rhythm (01/28 2048) Resp:  [18-19] 18 (01/29 0500) BP: (109-138)/(67-82) 138/82 mmHg (01/29 0500) SpO2:  [92 %-98 %] 95 % (01/29 0500) Weight:  [224 lb 10.4 oz (101.9 kg)] 224 lb 10.4 oz (101.9 kg) (01/29 0649)  Hemodynamic parameters for last 24 hours:    Intake/Output from previous day: 01/28 0701 - 01/29 0700 In: 483 [P.O.:480; I.V.:3] Out: 1225 [Urine:1225] Intake/Output this shift:    General appearance: alert, cooperative and no distress Heart: regular rate and rhythm and no rub Lungs: dim in bilat bases Abdomen: benign Extremities: + LE edema Wound: incis healing well  Lab Results:  Recent Labs  12/20/14 0503 12/21/14 0344  WBC 19.3* 15.1*  HGB 9.6* 9.1*  HCT 28.9* 27.5*  PLT 216 213   BMET:  Recent Labs  12/21/14 0344 12/22/14 0450  NA 133* 138  K 4.4 4.2  CL 94* 101  CO2 32 31  GLUCOSE 121* 107*  BUN 17 13  CREATININE 0.81 0.73  CALCIUM 8.6 8.6    PT/INR: No results for input(s): LABPROT, INR in the last 72 hours. ABG    Component Value Date/Time   PHART 7.324* 12/19/2014 0353   HCO3 23.7 12/19/2014 0353   TCO2 24 12/19/2014 1613   ACIDBASEDEF 3.0* 12/19/2014 0353   O2SAT 99.0 12/19/2014 0353   CBG (last 3)   Recent Labs  12/21/14 0720 12/21/14 1116 12/21/14 1647  GLUCAP 152* 122* 111*    Meds Scheduled Meds: . acetaminophen  1,000 mg  Oral 4 times per day   Or  . acetaminophen (TYLENOL) oral liquid 160 mg/5 mL  1,000 mg Per Tube 4 times per day  . antiseptic oral rinse  7 mL Mouth Rinse BID  . aspirin EC  325 mg Oral Daily  . atorvastatin  80 mg Oral q1800  . bisacodyl  10 mg Oral Daily   Or  . bisacodyl  10 mg Rectal Daily  . docusate sodium  200 mg Oral Daily  . furosemide  40 mg Oral Daily  . ketorolac  15 mg Intravenous 4 times per day  . metoprolol tartrate  12.5 mg Oral BID  . omega-3 acid ethyl esters  1 g Oral BID  . pantoprazole  40 mg Oral Daily  . potassium chloride  20 mEq Oral BID  . sodium chloride  3 mL Intravenous Q12H   Continuous Infusions: . sodium chloride     PRN Meds:.sodium chloride, ALPRAZolam, magnesium hydroxide, metoprolol, ondansetron (ZOFRAN) IV, oxyCODONE, sodium chloride, traMADol  Xrays Dg Chest 2 View  12/21/2014   CLINICAL DATA:  Status post coronary artery bypass graft x4.  EXAM: CHEST  2 VIEW  COMPARISON:  December 20, 2014.  FINDINGS: Sternotomy wires are noted. Left-sided chest tube is unchanged in position. There is now noted minimal left apical pneumothorax. Right-sided chest tube has been removed with  no pneumothorax seen on the right. Stable bibasilar subsegmental atelectasis is noted. No significant pleural effusion is noted.  IMPRESSION: Status post coronary artery bypass graft. Right-sided chest tube has been removed without evidence of pneumothorax seen on the right. Left-sided chest tube is stable in position. Minimal left apical pneumothorax is now seen.   Electronically Signed   By: Roque Lias M.D.   On: 12/21/2014 07:54    Assessment/Plan: S/P Procedure(s) (LRB): CORONARY ARTERY BYPASS GRAFTING (CABG), ON PUMP, TIMES FIVE, USING BILATERAL INTERNAL MAMMARY ARTERIES, RIGHT GREATER SAPHENOUS VEIN HARVESTED ENDOSCOPICALLY (N/A) INTRAOPERATIVE TRANSESOPHAGEAL ECHOCARDIOGRAM (N/A)  1 doing well 2 tachycardia- will increase beta blocker 3 labs stable 4 mild volume  overload- cont gentle diuresis 5 sugars adeq controlled 6 push pulm toilet- has small left pntx- observe 7 routine rehab 8 repeat labs in am   LOS: 7 days    GOLD,WAYNE E 12/22/2014  CXR with improved lung volumes Plan DC home in am= add lisinopril for preop MI  patient examined and medical record reviewed,agree with above note. VAN TRIGT III,Fuller Makin 12/22/2014

## 2014-12-22 NOTE — Discharge Summary (Signed)
Physician Discharge Summary  Patient ID: Hunter Gomez MRN: [id number] DOB/AGE: Jan 20, 1971 44 y.o.  Admit date: 12/15/2014 Discharge date: 12/23/2014  Admission Diagnoses: Unstable angina and non-ST elevation myocardial infarction  Discharge Diagnoses:  Active Problems:   NSTEMI (non-ST elevated myocardial infarction)   S/P CABG x 5  Patient Active Problem List   Diagnosis Date Noted  . 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 NSTEMI (non-ST elevated myocardial infarction) 12/15/2014  . Essential hypertension, benign 03/16/2013  . Obesity, unspecified 03/16/2013  Laparoscopic gastric banding  History of the present illness:  The patient is a 44 year old white male who is admitted with a history of unstable angina and ruled in for non-ST segment elevation myocardial infarction. The patient has a strong family history of coronary artery disease with both parents having CABG in their sixties. The patient has been having increasing frequency chest pain which has also worsened in severity since October. The patient also has a history of previous gastric banding procedure which she also relates episodes of  chest pressure to. On the day of presentation he had 3 episodes of chest pain including one while walking from the parking lot to the emergency department for which she needed to stop and rest. Due to these symptoms he was admitted for further evaluation and treatment to include cardiac CT scan for risk stratification.    Discharged Condition: good  Hospital Course: The patient was admitted to the cardiology service and started on intravenous heparin. He did rule in for non-ST segment elevation myocardial infarction. A CT scan revealed a very high calcium score with possible severe two-vessel disease especially in the proximal LAD. Due to these findings cardiac catheterization was scheduled and performed on 12/18/2014 with the following results noted:   Cardiac Catheterization Procedure Note  Name:  Hunter Gomez MRN: [id number] DOB: 1971/08/29  Procedure: Left Heart Cath, Selective Coronary Angiography, LV angiography  Indication: NSTEMI. 44 year-old male with strong family history of premature CAD presents with symptoms of unstable angina and minimal troponin elevation. He had a cardiac CTA demonstrating a very high coronary calcium score with obstructive disease identified in the LAD.  Procedural Details: The right wrist was prepped, draped, and anesthetized with 1% lidocaine. Using the modified Seldinger technique, a 5/6 French Slender sheath was introduced into the right radial artery. 3 mg of verapamil was administered through the sheath, weight-based unfractionated heparin was administered intravenously. Standard Judkins catheters were used for selective coronary angiography and left ventriculography. Catheter exchanges were performed over an exchange length guidewire. There were no immediate procedural complications. A TR band was used for radial hemostasis at the completion of the procedure. The patient was transferred to the post catheterization recovery area for further monitoring.  Procedural Findings: Hemodynamics: AO 112/68 LV 115/15  Coronary angiography: Coronary dominance: right  Left mainstem: The left mainstem arises from the left coronary cusp. There is an eccentric 95% distal left main stenosis extending into the ostium of the LAD and left circumflex.  Left anterior descending (LAD): The ostium of the LAD has severe stenosis of 90%. The remainder of the LAD has mild diffuse irregularity without high-grade stenosis identified. The mid LAD has 30% stenosis. The diagonal branches are patent.  Left circumflex (LCx): The left circumflex has 80-90% ostial stenosis. The proximal vessel has 50% stenosis. There is a single small obtuse marginal branch arising from the circumflex. There is an intermediate branch present. The ostium of the  intermediate branch is not well visualized. The mid vessel has  no significant obstructive disease.  Right coronary artery (RCA): Dominant vessel. The proximal, mid, and distal vessel has mild irregularity without high-grade stenosis. The ostium of the PDA branch has 80% stenosis. The ostium of the PLA branch has 75% stenosis.  Left ventriculography: Left ventricular systolic function is normal, LVEF is estimated at 55-65%, there is no significant mitral regurgitation   Estimated Blood Loss: Minimal  Final Conclusions:  1. Severe left main stenosis 2. Multivessel coronary artery disease with left main stenosis extension into the ostium of the LAD and left circumflex, and severe stenosis of the PDA and PLA branches of the RCA. 3. Normal LV function  Recommendations: Urgent CABG  Tonny Bollman MD, [facility]  Due to these findings, urgent cardiothoracic surgical consultation was obtained with Lovett Sox who evaluated the patient and his studies and agreed with recommendations to proceed with urgent coronary artery surgical revascularization.  Consults: CT surgery  Significant Diagnostic Studies: angiography: coronary  Treatments:  DATE OF PROCEDURE: 12/18/2014 DATE OF DISCHARGE:   OPERATIVE REPORT   OPERATIONS: 1. Emergency coronary artery bypass grafting x5 (left internal mammary  artery to left anterior descending artery, right free internal  mammary artery graft to ramus intermediate, saphenous vein graft to  diagonal, saphenous vein graft to obtuse marginal, saphenous vein  graft to posterior descending). 2. Endoscopic harvest of right leg greater saphenous vein.  PREOPERATIVE DIAGNOSES: 1. Unstable angina. 2. Non-ST elevation myocardial infarction. 3. Severe left main and three-vessel coronary artery disease.  POSTOPERATIVE DIAGNOSES: 1. Unstable angina. 2. Non-ST elevation myocardial infarction. 3. Severe left main and  three-vessel coronary artery disease.  SURGEON: Kerin Perna MD.  ASSISTANT: Coral Ceo, PA-C.  ANESTHESIA: General by Dr. Laverle Hobby.   Postoperative hospital course: Overall the patient has progressed nicely. He is neurologically intact. He was weaned from the ventilator using standard protocols. All routine lines, monitors and drainage devices have been discontinued in the standard fashion. He does have an expected acute blood loss anemia which is stable. He has moderate postoperative volume overload which is responding to diuretics. Oxygen has been weaned and he maintains good saturations on room air. He has remained in a sinus rhythm. He is tolerating gradually increasing activities using standard protocols. Incisions are healing without evidence of infection. He is tolerating diet. His overall status is felt to be tentatively stable for discharge in the next 24-48 hours pending ongoing reevaluation of his recovery.     Discharge Exam: Blood pressure 131/76, pulse 87, temperature 98 F (36.7 C), temperature source Oral, resp. rate 18, height  (1.727 m), weight 220 lb 3.8 oz (99.9 kg), SpO2 98 %. Heart: RRR Lungs: Clear Wound: Clean and dry Extremities: Mild LE edema    Disposition: 01-Home or Self Care  Discharge Instructions    Amb Referral to Cardiac Rehabilitation    Complete by:  As directed            Medications at time of discharge:   Medication List    STOP taking these medications        lisinopril-hydrochlorothiazide 20-25 MG per tablet  Commonly known as:  PRINZIDE,ZESTORETIC     oxymetazoline 0.05 % nasal spray  Commonly known as:  AFRIN      TAKE these medications        acetaminophen 160 MG/5ML solution  Commonly known as:  TYLENOL  Take 240 mg by mouth every 6 (six) hours as needed for moderate pain.     aspirin 325 MG EC  tablet  Take 1 tablet (325 mg total) by mouth daily.     atorvastatin 80 MG tablet  Commonly known as:   LIPITOR  Take 1 tablet (80 mg total) by mouth daily.     dextromethorphan 30 MG/5ML liquid  Commonly known as:  DELSYM  Take 60 mg by mouth as needed for cough.     furosemide 40 MG tablet  Commonly known as:  LASIX  Take 1 tablet (40 mg total) by mouth daily. X 1 week     lisinopril 10 MG tablet  Commonly known as:  PRINIVIL,ZESTRIL  Take 1 tablet (10 mg total) by mouth daily.     metoprolol 50 MG tablet  Commonly known as:  LOPRESSOR  Take 1 tablet (50 mg total) by mouth 2 (two) times daily.     omega-3 acid ethyl esters 1 G capsule  Commonly known as:  LOVAZA  Take 1 capsule (1 g total) by mouth 2 (two) times daily.     omeprazole 40 MG capsule  Commonly known as:  PRILOSEC  Take 1 capsule (40 mg total) by mouth daily.     oxyCODONE 5 MG immediate release tablet  Commonly known as:  Oxy IR/ROXICODONE  Take 1-2 tablets (5-10 mg total) by mouth every 3 (three) hours as needed for severe pain.     potassium chloride SA 20 MEQ tablet  Commonly known as:  K-DUR,KLOR-CON  Take 1 tablet (20 mEq total) by mouth daily. X 1 week        The patient has been discharged on:   1.Beta Blocker:  Yes Cove.Etienne   ]                              No   [   ]                              If No, reason:  2.Ace Inhibitor/ARB: Yes Cove.Etienne   ]                                     No  [    ]                                     If No, reason:  3.Statin:   Yes Cove.Etienne   ]                  No  [   ]                  If No, reason:  4.Ecasa:  Yes  [ y  ]                  No   [   ]                  If No, reason:   Follow-up Information    Follow up with VAN Dinah Beers, MD.   Specialty:  Cardiothoracic Surgery   Why:  01/17/2015 at 4:30 PM to see the surgeon. Please obtain a chest x-ray and [facility] [facility] at 3:45 PM. [facility] is located in the same office complex as the Careers adviser.   Contact  information:   [address] [place] [address] [phone]        Follow up with Tereso NewcomerScott Weaver, PA-C On 01/08/2015.   Specialty:  Physician Assistant   Why:  Appointment is at 10:10   Contact information:   [address] [place] [phone]       Signed: Adella HareCOLLINS,GINA H 12/23/2014, 9:34 AM

## 2014-12-23 LAB — BASIC METABOLIC PANEL
Anion gap: 4 — ABNORMAL LOW (ref 5–15)
BUN: 12 mg/dL (ref 6–23)
CALCIUM: 8.6 mg/dL (ref 8.4–10.5)
CO2: 28 mmol/L (ref 19–32)
Chloride: 105 mmol/L (ref 96–112)
Creatinine, Ser: 0.69 mg/dL (ref 0.50–1.35)
GFR calc Af Amer: >90 mL/min (ref >90)
Glucose, Bld: 107 mg/dL — ABNORMAL HIGH (ref 70–99)
POTASSIUM: 4.7 mmol/L (ref 3.5–5.1)
Sodium: 137 mmol/L (ref 135–145)

## 2014-12-23 LAB — CBC
HEMATOCRIT: 29.8 % — AB (ref 39.0–52.0)
HEMOGLOBIN: 9.7 g/dL — AB (ref 13.0–17.0)
MCH: 27.7 pg (ref 26.0–34.0)
MCHC: 32.6 g/dL (ref 30.0–36.0)
MCV: 85.1 fL (ref 78.0–100.0)
Platelets: 322 10*3/uL (ref 150–400)
RBC: 3.5 MIL/uL — AB (ref 4.22–5.81)
RDW: 13.8 % (ref 11.5–15.5)
WBC: 11.3 10*3/uL — AB (ref 4.0–10.5)

## 2014-12-23 MED ORDER — ASPIRIN 325 MG PO TBEC: 325.0000 mg | DELAYED_RELEASE_TABLET | Freq: Every day | ORAL | Status: DC

## 2014-12-23 MED ORDER — LISINOPRIL 10 MG PO TABS: 10.0000 mg | ORAL_TABLET | Freq: Every day | ORAL | Status: DC

## 2014-12-23 MED ORDER — FUROSEMIDE 40 MG PO TABS: 40.0000 mg | ORAL_TABLET | Freq: Every day | ORAL | Status: DC

## 2014-12-23 MED ORDER — METOPROLOL TARTRATE 50 MG PO TABS
50.0000 mg | ORAL_TABLET | Freq: Two times a day (BID) | ORAL | Status: DC
Start: 2014-12-23 — End: 2014-12-23
  Administered 2014-12-23: 50 mg via ORAL
  Filled 2014-12-23 (×2): qty 1

## 2014-12-23 MED ORDER — METOPROLOL TARTRATE 50 MG PO TABS: 50.0000 mg | ORAL_TABLET | Freq: Two times a day (BID) | ORAL | Status: DC

## 2014-12-23 MED ORDER — OXYCODONE HCL 5 MG PO TABS: 5.0000 mg | ORAL_TABLET | ORAL | Status: DC | PRN

## 2014-12-23 MED ORDER — ATORVASTATIN CALCIUM 80 MG PO TABS: 80.0000 mg | ORAL_TABLET | Freq: Every day | ORAL | Status: DC

## 2014-12-23 MED ORDER — POTASSIUM CHLORIDE CRYS ER 20 MEQ PO TBCR: 20.0000 meq | EXTENDED_RELEASE_TABLET | Freq: Every day | ORAL | Status: DC

## 2014-12-23 MED ORDER — OMEGA-3-ACID ETHYL ESTERS 1 G PO CAPS: 1.0000 g | ORAL_CAPSULE | Freq: Two times a day (BID) | ORAL | Status: DC

## 2014-12-23 NOTE — Progress Notes (Signed)
CT sutures removed per order, edges intact benzoin and steristrips applied Hunter Gomez, [name] A

## 2014-12-23 NOTE — Progress Notes (Signed)
       [address]       [place] [id number]             [phone]          5 Days Post-Op Procedure(s) (LRB): CORONARY ARTERY BYPASS GRAFTING (CABG), ON PUMP, TIMES FIVE, USING BILATERAL INTERNAL MAMMARY ARTERIES, RIGHT GREATER SAPHENOUS VEIN HARVESTED ENDOSCOPICALLY (N/A) INTRAOPERATIVE TRANSESOPHAGEAL ECHOCARDIOGRAM (N/A)  Subjective: Feels well.  Ready to go home.  Objective: Vital signs in last 24 hours: Patient Vitals for the past 24 hrs:  BP Temp Temp src Pulse Resp SpO2 Weight  12/23/14 0347 131/76 mmHg 98 F (36.7 C) Oral 87 18 98 % 220 lb 3.8 oz (99.9 kg)  12/22/14 2106 (!) 141/81 mmHg 98.4 F (36.9 C) Oral - 18 98 % -  12/22/14 2100 (!) 141/81 mmHg - - 97 - - -  12/22/14 1451 - 98.3 F (36.8 C) - - - - -  12/22/14 1427 133/79 mmHg 97.9 F (36.6 C) - 98 - 97 % -  12/22/14 1223 (!) 143/93 mmHg 97.9 F (36.6 C) - 89 - 97 % -  12/22/14 0943 (!) 151/80 mmHg 98.2 F (36.8 C) Oral (!) 107 20 98 % -   Current Weight  12/23/14 220 lb 3.8 oz (99.9 kg)  PRE-OPERATIVE WEIGHT: 102 kg   Intake/Output from previous day: 01/29 0701 - 01/30 0700 In: 240 [P.O.:240] Out: 328 [Urine:325; Stool:3]    PHYSICAL EXAM:  Heart: RRR Lungs: Clear Wound: Clean and dry Extremities: Mild LE edema    Lab Results: CBC: Recent Labs  12/21/14 0344 12/23/14 0346  WBC 15.1* 11.3*  HGB 9.1* 9.7*  HCT 27.5* 29.8*  PLT 213 322   BMET:  Recent Labs  12/22/14 0450 12/23/14 0346  NA 138 137  K 4.2 4.7  CL 101 105  CO2 31 28  GLUCOSE 107* 107*  BUN 13 12  CREATININE 0.73 0.69  CALCIUM 8.6 8.6    PT/INR: No results for input(s): LABPROT, INR in the last 72 hours.    Assessment/Plan: S/P Procedure(s) (LRB): CORONARY ARTERY BYPASS GRAFTING (CABG), ON PUMP, TIMES FIVE, USING BILATERAL INTERNAL MAMMARY ARTERIES, RIGHT GREATER SAPHENOUS VEIN HARVESTED ENDOSCOPICALLY (N/A) INTRAOPERATIVE TRANSESOPHAGEAL ECHOCARDIOGRAM (N/A)  CV- still mildy tachy  and BPs elevated.  Will increase Lopressor, continue Lisinopril.  Vol overload- diurese.  Overall stable for discharge home- instructions reviewed with patient.   LOS: 8 days    Frankie Zito H 12/23/2014

## 2015-01-08 ENCOUNTER — Encounter: Payer: Self-pay | Admitting: Physician Assistant

## 2015-01-08 ENCOUNTER — Ambulatory Visit (INDEPENDENT_AMBULATORY_CARE_PROVIDER_SITE_OTHER): Payer: BLUE CROSS/BLUE SHIELD | Admitting: Physician Assistant

## 2015-01-08 VITALS — BP 110/60 | HR 74 | Ht 69.0 in | Wt 215.0 lb

## 2015-01-08 DIAGNOSIS — I2510 Atherosclerotic heart disease of native coronary artery without angina pectoris: Secondary | ICD-10-CM

## 2015-01-08 DIAGNOSIS — R9431 Abnormal electrocardiogram [ECG] [EKG]: Secondary | ICD-10-CM

## 2015-01-08 DIAGNOSIS — I10 Essential (primary) hypertension: Secondary | ICD-10-CM

## 2015-01-08 DIAGNOSIS — E785 Hyperlipidemia, unspecified: Secondary | ICD-10-CM | POA: Insufficient documentation

## 2015-01-08 DIAGNOSIS — Z951 Presence of aortocoronary bypass graft: Secondary | ICD-10-CM

## 2015-01-08 MED ORDER — NITROGLYCERIN 0.4 MG SL SUBL: 0.4000 mg | SUBLINGUAL_TABLET | SUBLINGUAL | Status: DC | PRN

## 2015-01-08 NOTE — Patient Instructions (Addendum)
Your physician has recommended you make the following change in your medication: STOP LOVAZA  Prescription for Nitroglycerin Sublingual 0.4 mg take 1 as needed for chest pain sent to pharmacy and patient notified.   LAB WORK TO BE DONE IN 6 WEEKS FOR FASTING LIPID AND LIVER PANEL  Your physician recommends that you schedule a follow-up appointment in: 8 WEEKS WITH DR. Excell SeltzerOOPER  Your physician has requested that you have an echocardiogram. Echocardiography is a painless test that uses sound waves to create images of your heart. It provides your doctor with information about the size and shape of your heart and how well your heart's chambers and valves are working. This procedure takes approximately one hour. There are no restrictions for this procedure.

## 2015-01-08 NOTE — Progress Notes (Signed)
Let him know that I have been copied on the recent notes from hospital.  In general how is he doing since the hospitalization?

## 2015-01-08 NOTE — Progress Notes (Signed)
Cardiology Office Note   Date:  01/08/2015   ID:  Hunter Gomez, DOB 07/05/1971, MRN [id number]  Patient Care Team: Jac Canavan, PA-C as PCP - General (Family Medicine) Micheline Chapman, MD as Consulting Physician (Cardiology)    Chief Complaint  Patient presents with  . Hospitalization Follow-up    NSTEMI >> s/p CABG  . Coronary Artery Disease     History of Present Illness: Hunter Gomez is a 44 y.o. male with a hx of HTN, obesity s/p Lap Band surgery, GERD.  He was admitted 1/22-1/30 with a NSTEMI. Cardiac CTA demonstrated high Ca score with possible 2v CAD.  LHC demonstrated severe 3v CAD with critical LM stenosis.  He was referred for urgent CABG with Dr. Donata Clay (L-LAD, Free RIMA-RI, S-Dx, S-PDA).  Post op course was fairly uneventful.    Since DC, he is doing well. He denies significant chest discomfort.  He denies pleuritic chest pain.  Denies orthopnea, PND, edema.  Denies syncope. No cough.  No fevers, chills.   Studies/Reports Reviewed Today:  Discharge Summary (12/15/14-12/23/14)  Cardiac Cath 12/18/14 LM:  Distal 95% extending into ostium of LAD and LCx LAD:  Ostial 90%, mid 30% LCx: ostial 80-90%, proximal 50% RCA:  Ostial PDA 80%, ostial PLA 75% EF 55-65% >> Emergent CABG  Carotid US 12/18/14 Bilateral ICA 1-39%   Past Medical History  Diagnosis Date  . Vision problem     prior use of glasses, not currently  . Muscle weakness 1997    prior seizure like activity, but none since; prior neurology eval, prior normal EEG 1997  . Hypertension 10/2012  . HLD (hyperlipidemia)   . Obesity     s/p Lap Band surgery  . CAD (coronary artery disease)     a.  NSTEMI (1/16):  LHC - dLM 95, oLAD 90, mLAD 30, oCFX 80-90, pCFX 50, oPDA 80, oPLA 75, EF 55-65% >> urgent CABG    Past Surgical History  Procedure Laterality Date  . Tonsillectomy    . Wisdom tooth extraction    . Laparoscopic gastric banding  2007    initial procedure Dr. Robb Matar in [place],  adjustments in [place], [place]  . Laparoscopic gastric banding  2006  . Left heart catheterization with coronary angiogram N/A 12/18/2014    Procedure: LEFT HEART CATHETERIZATION WITH CORONARY ANGIOGRAM;  Surgeon: Micheline Chapman, MD;  [facility] C[facility] LAB;  Service: Cardiovascular;  Laterality: N/A;  . Coronary artery bypass graft N/A 12/18/2014    Procedure: CORONARY ARTERY BYPASS GRAFTING (CABG), ON PUMP, TIMES FIVE, USING BILATERAL INTERNAL MAMMARY ARTERIES, RIGHT GREATER SAPHENOUS VEIN HARVESTED ENDOSCOPICALLY;  Surgeon: Kerin Perna, MD;  Location: [facility] OR;  Service: Open Heart Surgery;  Laterality: N/A;  LIMA-LAD, SVG-D, SVG-OM, Free RIMA-Ramus, SVG-PD  . Intraoperative transesophageal echocardiogram N/A 12/18/2014    Procedure: INTRAOPERATIVE TRANSESOPHAGEAL ECHOCARDIOGRAM;  Surgeon: Kerin Perna, MD;  Location[facility] OR;  Service: Open Heart Surgery;  Laterality: N/A;     Current Outpatient Prescriptions  Medication Sig Dispense Refill  . aspirin EC 325 MG EC tablet Take 1 tablet (325 mg total) by mouth daily. 30 tablet 0  . atorvastatin (LIPITOR) 80 MG tablet Take 1 tablet (80 mg total) by mouth daily. 30 tablet 1  . lisinopril (PRINIVIL,ZESTRIL) 10 MG tablet Take 1 tablet (10 mg total) by mouth daily. 30 tablet 1  . metoprolol (LOPRESSOR) 50 MG tablet Take 1 tablet (50 mg total) by mouth 2 (two) times daily. 60 tablet  1  . omega-3 acid ethyl esters (LOVAZA) 1 G capsule Take 1 capsule (1 g total) by mouth 2 (two) times daily. 60 capsule 1  . omeprazole (PRILOSEC) 40 MG capsule Take 1 capsule (40 mg total) by mouth daily. 30 capsule 0  . oxyCODONE (OXY IR/ROXICODONE) 5 MG immediate release tablet Take 1-2 tablets (5-10 mg total) by mouth every 3 (three) hours as needed for severe pain. (Patient not taking: Reported on 01/08/2015) 40 tablet 0   No current facility-administered medications for this visit.    Allergies:   Review of patient's allergies indicates no known allergies.     Social History:  The patient  reports that he has never smoked. He does not have any smokeless tobacco history on file. He reports that he does not drink alcohol or use illicit drugs.   Family History:  The patient's family history includes Cancer in his maternal grandfather and paternal grandmother; Diabetes in his mother; Heart disease (age of onset: 6458) in his father; Heart disease (age of onset: 6161) in his mother; Hyperlipidemia in his brother, father, and mother; Hypertension in his brother, father, and mother; Peripheral vascular disease in his mother; Stroke in his father. There is no history of Heart attack.    ROS:  Please see the history of present illness.   Otherwise, review of systems are positive for dysuria and hesitancy for several days post DC that has now resolved.   All other systems are reviewed and negative.    PHYSICAL EXAM: VS:  BP 110/60 mmHg  Pulse 74  Ht 5\' 9"  (1.753 m)  Wt 215 lb (97.523 kg)  BMI 31.74 kg/m2    Wt Readings from Last 3 Encounters:  01/08/15 215 lb (97.523 kg)  12/23/14 220 lb 3.8 oz (99.9 kg)  12/04/14 218 lb (98.884 kg)     GEN: Well nourished, well developed, in no acute distress HEENT: normal Neck: no JVD, no masses Cardiac:  Normal S1/S2, RRR; no murmur, no rubs or gallops, no edema  Respiratory:  clear to auscultation bilaterally, no wheezing, rhonchi or rales. GI: soft, nontender, nondistended, + BS MS: no deformity or atrophy Skin: warm and dry  Neuro:  CNs II-XII intact, Strength and sensation are intact Psych: Normal affect   EKG:  EKG is ordered today.  It demonstrates:   NSR, HR 74, normal axis, diffuse ST elevation (?pericarditis)   Recent Labs: 12/04/2014: TSH 2.366 12/18/2014: ALT 34 12/19/2014: Magnesium 2.2 12/23/2014: BUN 12; Creatinine 0.69; Hemoglobin 9.7*; Platelets 322; Potassium 4.7; Sodium 137    Lipid Panel    Component Value Date/Time   CHOL 209* 12/04/2014 1119   TRIG 262* 12/04/2014 1119   HDL 31*  12/04/2014 1119   CHOLHDL 6.7 12/04/2014 1119   VLDL 52* 12/04/2014 1119   LDLCALC 126* 12/04/2014 1119      ASSESSMENT AND PLAN:  1.  Coronary artery disease S/P CABG x 5:  Doing well since DC.  His ECG has some diffuse ST elevation.  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  However, he has no symptoms.  No further medical management necessary at this time.  He has been contacted by Cardiac Rehab and plans to start soon.  He sees Dr. Donata ClayVan Trigt in a couple of weeks.  He prefers to seen Dr. Excell Seltzerooper here for FU.    -  Start Cardiac Rehab once he is able.    -  Arrange 2-D Echocardiogram to assess LVF and rule out pericardial effusion.    -  Continue ASA, high dose statin, beta blocker, ACEI. 2.  Essential hypertension:  BP controlled.  He is tolerating his regimen of Lisinopril and Metoprolol Tartrate.  3.  HLD (hyperlipidemia):  Continue high dose statin with Lipitor 80.    -  Check Lipids and LFTs in 6 weeks.      -  DC Lovaza.    Current medicines are reviewed at length with the patient today.  The patient has concerns regarding medicines.  The following changes have been made:  DC Lovaza  Labs/ tests ordered today include:  Orders Placed This Encounter  Procedures  . Hepatic function panel  . Lipid Profile  . EKG 12-Lead  . 2D Echocardiogram without contrast     Disposition:   FU with Dr. Tonny Bollman in 8 weeks.    Signed, Brynda Rim, MHS 01/08/2015 10:19 AM    [facility] [address] [place], [place], [place]  [address] Phone: [phone]; Fax: [phone]

## 2015-01-10 ENCOUNTER — Ambulatory Visit (HOSPITAL_COMMUNITY): Payer: BLUE CROSS/BLUE SHIELD | Attending: Medical | Admitting: Cardiology

## 2015-01-10 ENCOUNTER — Encounter: Payer: Self-pay | Admitting: Physician Assistant

## 2015-01-10 ENCOUNTER — Telehealth: Payer: Self-pay | Admitting: *Deleted

## 2015-01-10 DIAGNOSIS — I2510 Atherosclerotic heart disease of native coronary artery without angina pectoris: Secondary | ICD-10-CM | POA: Diagnosis present

## 2015-01-10 DIAGNOSIS — R9431 Abnormal electrocardiogram [ECG] [EKG]: Secondary | ICD-10-CM | POA: Diagnosis not present

## 2015-01-10 NOTE — Telephone Encounter (Signed)
lmptcb to go over echo results  

## 2015-01-10 NOTE — Progress Notes (Signed)
Echo performed. 

## 2015-01-11 NOTE — Telephone Encounter (Signed)
pt notified about echo results with verbal understanding to results given today. 

## 2015-01-11 NOTE — Telephone Encounter (Signed)
Follow up     Returning Hunter Gomez's call for echo results

## 2015-01-16 ENCOUNTER — Other Ambulatory Visit: Payer: Self-pay | Admitting: Cardiothoracic Surgery

## 2015-01-16 DIAGNOSIS — Z951 Presence of aortocoronary bypass graft: Secondary | ICD-10-CM

## 2015-01-17 ENCOUNTER — Ambulatory Visit
Admission: RE | Admit: 2015-01-17 | Discharge: 2015-01-17 | Disposition: A | Discharge status: Home / Self Care | Payer: BLUE CROSS/BLUE SHIELD | Source: Ambulatory Visit | Attending: Cardiothoracic Surgery | Admitting: Cardiothoracic Surgery

## 2015-01-17 ENCOUNTER — Encounter: Payer: Self-pay | Admitting: Cardiothoracic Surgery

## 2015-01-17 ENCOUNTER — Ambulatory Visit (INDEPENDENT_AMBULATORY_CARE_PROVIDER_SITE_OTHER): Payer: Self-pay | Admitting: Cardiothoracic Surgery

## 2015-01-17 VITALS — BP 122/75 | HR 67 | Resp 20 | Ht 69.0 in | Wt 215.0 lb

## 2015-01-17 DIAGNOSIS — Z951 Presence of aortocoronary bypass graft: Secondary | ICD-10-CM

## 2015-01-17 NOTE — Progress Notes (Signed)
PCP is Ernst BreachYSINGER, DAVID SHANE, PA-C Referring Provider is Micheline Chapmanooper, Michael D, MD  Chief Complaint  Patient presents with  . Routine Post Op    3 week f/u with CXR, s/p Emergency coronary artery bypass grafting x5 12/19/14    HPI:the patient is a very nice 44 year old oh returns for his first postop visit following urgent CABG x5 using bilateral IMA grafts for unstable angina and left main stenosis.patient is clinically done well postop. He was seen in the cardiology office 10 days ago. There is nonspecific EKG changes on echocardiogram was performed which shows normal LV function, no evidence of pericarditis. The patient is in a recurrent chest pain or symptoms of CHF. The surgical incisions were healing well. He is having no significant problems with postoperative pain.   Past Medical History  Diagnosis Date  . Vision problem     prior use of glasses, not currently  . Muscle weakness 1997    prior seizure like activity, but none since; prior neurology eval, prior normal EEG 1997  . Hypertension 10/2012  . HLD (hyperlipidemia)   . Obesity     s/p Lap Band surgery  . CAD (coronary artery disease)     a.  NSTEMI (1/16):  LHC - dLM 95, oLAD 90, mLAD 30, oCFX 80-90, pCFX 50, oPDA 80, oPLA 75, EF 55-65% >> urgent CABG  . Hx of echocardiogram     Echo (2/16):  Mild LVH, EF 55-60%, Gr 2 DD, trivial AI/TR, no effusion    Past Surgical History  Procedure Laterality Date  . Tonsillectomy    . Wisdom tooth extraction    . Laparoscopic gastric banding  2007    initial procedure Dr. Robb [place]arrtiz [place], [place], [place]  . Laparoscopic gastric banding  2006  . Left heart catheterization with coronary angiogram N/A 12/18/2014    Procedure: LEFT HEART CATHETERIZATION WITH CORONARY ANGIOGRAM;  Surgeon: Micheline ChapmanMichael D Cooper, MD;  [facility];  Service: Cardiovascular;  Laterality: N/A;  . Coronary artery bypass graft N/A 12/18/2014    Procedure: CORONARY ARTERY BYPASS GRAFTING  (CABG), ON PUMP, TIMES FIVE, USING BILATERAL INTERNAL MAMMARY ARTERIES, RIGHT GREATER SAPHENOUS VEIN HARVESTED ENDOSCOPICALLY;  Surgeon: Kerin PernaPeter Van Trigt, MD;  [facility];  Service: Open Heart Surgery;  Laterality: N/A;  LIMA-LAD, SVG-D, SVG-OM, Free RIMA-Ramus, SVG-PD  . Intraoperative transesophageal echocardiogram N/A 12/18/2014    Procedure: INTRAOPERATIVE TRANSESOPHAGEAL ECHOCARDIOGRAM;  Surgeon: Kerin PernaPeter Van Trigt, MD;  [facility];  Service: Open Heart Surgery;  Laterality: N/A;    Family History  Problem Relation Age of Onset  . Diabetes Mother   . Hypertension Mother   . Hyperlipidemia Mother   . Heart disease Mother 3661    CABG  . Peripheral vascular disease Mother   . Hypertension Father   . Hyperlipidemia Father   . Heart disease Father [id number]    CABG  . Stroke Father   . Hypertension Brother   . Hyperlipidemia Brother   . Cancer Maternal Grandfather     stomach  . Cancer Paternal Grandmother   . Heart attack Neg Hx     Social History History  Substance Use Topics  . Smoking status: Never Smoker   . Smokeless tobacco: Not on file  . Alcohol Use: No    Current Outpatient Prescriptions  Medication Sig Dispense Refill  . aspirin EC 325 MG EC tablet Take 1 tablet (325 mg total) by mouth daily. 30 tablet 0  . atorvastatin (LIPITOR) 80 MG  tablet Take 1 tablet (80 mg total) by mouth daily. 30 tablet 1  . lisinopril (PRINIVIL,ZESTRIL) 10 MG tablet Take 1 tablet (10 mg total) by mouth daily. 30 tablet 1  . metoprolol (LOPRESSOR) 50 MG tablet Take 1 tablet (50 mg total) by mouth 2 (two) times daily. 60 tablet 1  . nitroGLYCERIN (NITROSTAT) 0.4 MG SL tablet Place 1 tablet (0.4 mg total) under the tongue every 5 (five) minutes as needed for chest pain. 25 tablet 3  . omeprazole (PRILOSEC) 40 MG capsule Take 1 capsule (40 mg total) by mouth daily. 30 capsule 0   No current facility-administered medications for this visit.    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 Patient lives in [place] will not be able to attend cardiac rehabilitation at the hospital. Patient will be L. 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 He knows not to lift more than 20 pounds until the month after surgery.he knows that he should stay and his aspirin and Lipitor indefinitely.  Plan:patient will return as needed

## 2015-02-17 ENCOUNTER — Other Ambulatory Visit: Payer: Self-pay | Admitting: Physician Assistant

## 2015-02-19 ENCOUNTER — Telehealth: Payer: Self-pay | Admitting: *Deleted

## 2015-02-19 ENCOUNTER — Other Ambulatory Visit (INDEPENDENT_AMBULATORY_CARE_PROVIDER_SITE_OTHER): Payer: BLUE CROSS/BLUE SHIELD | Admitting: *Deleted

## 2015-02-19 ENCOUNTER — Encounter: Payer: Self-pay | Admitting: *Deleted

## 2015-02-19 DIAGNOSIS — E785 Hyperlipidemia, unspecified: Secondary | ICD-10-CM

## 2015-02-19 DIAGNOSIS — Z951 Presence of aortocoronary bypass graft: Secondary | ICD-10-CM

## 2015-02-19 DIAGNOSIS — I2510 Atherosclerotic heart disease of native coronary artery without angina pectoris: Secondary | ICD-10-CM

## 2015-02-19 LAB — HEPATIC FUNCTION PANEL
ALBUMIN: 4.2 g/dL (ref 3.5–5.2)
ALT: 38 U/L (ref 0–53)
AST: 23 U/L (ref 0–37)
Alkaline Phosphatase: 181 U/L — ABNORMAL HIGH (ref 39–117)
BILIRUBIN TOTAL: 0.4 mg/dL (ref 0.2–1.2)
Bilirubin, Direct: 0.1 mg/dL (ref 0.0–0.3)
TOTAL PROTEIN: 7.4 g/dL (ref 6.0–8.3)

## 2015-02-19 LAB — LIPID PANEL
CHOL/HDL RATIO: 3
Cholesterol: 103 mg/dL (ref 0–200)
HDL: 34.1 mg/dL — AB (ref >39.00)
LDL Cholesterol: 43 mg/dL (ref 0–99)
NonHDL: 68.9
Triglycerides: 132 mg/dL (ref 0.0–149.0)
VLDL: 26.4 mg/dL (ref 0.0–40.0)

## 2015-02-19 MED ORDER — LISINOPRIL 10 MG PO TABS: 10.0000 mg | ORAL_TABLET | Freq: Every day | ORAL | Status: DC

## 2015-02-19 MED ORDER — METOPROLOL TARTRATE 50 MG PO TABS: 50.0000 mg | ORAL_TABLET | Freq: Two times a day (BID) | ORAL | Status: DC

## 2015-02-19 MED ORDER — ATORVASTATIN CALCIUM 80 MG PO TABS: 80.0000 mg | ORAL_TABLET | Freq: Every day | ORAL | Status: DC

## 2015-02-19 NOTE — Telephone Encounter (Signed)
pt has been notified of lab results. Pt asked for refills to be sent in for metoprolol, lisiniopril, lipitor. pt will get lft when he sees Dr. Excell Seltzerooper.

## 2015-02-19 NOTE — Telephone Encounter (Signed)
This encounter was created in error - please disregard.

## 2015-02-19 NOTE — Telephone Encounter (Signed)
lmptcb to go over lab results and recommendations 

## 2015-03-07 ENCOUNTER — Encounter: Payer: Self-pay | Admitting: Cardiovascular Disease

## 2015-03-07 ENCOUNTER — Other Ambulatory Visit: Payer: BLUE CROSS/BLUE SHIELD

## 2015-03-07 ENCOUNTER — Ambulatory Visit (INDEPENDENT_AMBULATORY_CARE_PROVIDER_SITE_OTHER): Payer: BLUE CROSS/BLUE SHIELD | Admitting: Cardiovascular Disease

## 2015-03-07 VITALS — BP 136/88 | HR 52 | Ht 69.0 in | Wt 219.0 lb

## 2015-03-07 DIAGNOSIS — E785 Hyperlipidemia, unspecified: Secondary | ICD-10-CM | POA: Diagnosis not present

## 2015-03-07 DIAGNOSIS — I2510 Atherosclerotic heart disease of native coronary artery without angina pectoris: Secondary | ICD-10-CM

## 2015-03-07 NOTE — Progress Notes (Signed)
Cardiology Office Note   Date:  03/07/2015   ID:  [name], DOB 1970/12/18, MRN [id number]  PCP:  Ernst Breach, PA-C  Cardiologist:  Tonny Bollman, MD    Chief Complaint  Patient presents with  . Coronary Artery Disease    History of Present Illness: Hunter Gomez is a 44 y.o. male who presents for follow-up of CAD. The patient initially presented in January 2016 with NSTEMI. He was found to have severe left main and multivessel CAD and was ultimately treated with multivessel CABG.   He has had a fairly uneventful recovery from CABG. He had some pain at the time of his chest tube removal, otherwise has had very little pain. He also reports frequent urination at night. He has started taking saw palmetto. He's had no exertional chest pain or tightness. He denies shortness of breath, edema, or heart palpitations. He is taking his medications as prescribed. He is eager to get back to all of his regular physical activities.  Past Medical History  Diagnosis Date  . Vision problem     prior use of glasses, not currently  . Muscle weakness 1997    prior seizure like activity, but none since; prior neurology eval, prior normal EEG 1997  . Hypertension 10/2012  . HLD (hyperlipidemia)   . Obesity     s/p Lap Band surgery  . CAD (coronary artery disease)     a.  NSTEMI (1/16):  LHC - dLM 95, oLAD 90, mLAD 30, oCFX 80-90, pCFX 50, oPDA 80, oPLA 75, EF 55-65% >> urgent CABG  . Hx of echocardiogram     Echo (2/16):  Mild LVH, EF 55-60%, Gr 2 DD, trivial AI/TR, no effusion    Past Surgical History  Procedure Laterality Date  . Tonsillectomy    . Wisdom tooth extraction    . Laparoscopic gastric banding  2007    initial procedure Dr. Robb Matar in [place], adjustments in [place], [place]  . Laparoscopic gastric banding  2006  . Left heart catheterization with coronary angiogram N/A 12/18/2014    Procedure: LEFT HEART CATHETERIZATION WITH CORONARY ANGIOGRAM;  Surgeon: Micheline Chapman, MD;  [facility] LAB;  Service: Cardiovascular;  Laterality: N/A;  . Coronary artery bypass graft N/A 12/18/2014    Procedure: CORONARY ARTERY BYPASS GRAFTING (CABG), ON PUMP, TIMES FIVE, USING BILATERAL INTERNAL MAMMARY ARTERIES, RIGHT GREATER SAPHENOUS VEIN HARVESTED ENDOSCOPICALLY;  Surgeon: Kerin Perna, MD;  Location: [facility] OR;  Service: Open Heart Surgery;  Laterality: N/A;  LIMA-LAD, SVG-D, SVG-OM, Free RIMA-Ramus, SVG-PD  . Intraoperative transesophageal echocardiogram N/A 12/18/2014    Procedure: INTRAOPERATIVE TRANSESOPHAGEAL ECHOCARDIOGRAM;  Surgeon: Kerin Perna, MD;  [facility];  Service: Open Heart Surgery;  Laterality: N/A;    Current Outpatient Prescriptions  Medication Sig Dispense Refill  . aspirin EC 325 MG EC tablet Take 1 tablet (325 mg total) by mouth daily. 30 tablet 0  . atorvastatin (LIPITOR) 80 MG tablet Take 1 tablet (80 mg total) by mouth daily. 30 tablet 6  . lisinopril (PRINIVIL,ZESTRIL) 10 MG tablet Take 1 tablet (10 mg total) by mouth daily. 30 tablet 6  . metoprolol (LOPRESSOR) 50 MG tablet Take 1 tablet (50 mg total) by mouth 2 (two) times daily. 60 tablet 6  . nitroGLYCERIN (NITROSTAT) 0.4 MG SL tablet Place 1 tablet (0.4 mg total) under the tongue every 5 (five) minutes as needed for chest pain. 25 tablet 3  . Saw Palmetto 450 MG CAPS Take 3  capsules by mouth daily.     No current facility-administered medications for this visit.    Allergies:   Review of patient's allergies indicates no known allergies.   Social History:  The patient  reports that he has never smoked. He does not have any smokeless tobacco history on file. He reports that he does not drink alcohol or use illicit drugs.   Family History:  The patient's family history includes Cancer in his maternal grandfather and paternal grandmother; Diabetes in his mother; Heart disease (age of onset: 48) in his father; Heart disease (age of onset: 29) in his mother; Hyperlipidemia in  his brother, father, and mother; Hypertension in his brother, father, and mother; Peripheral vascular disease in his mother; Stroke in his father. There is no history of Heart attack.   ROS:  Please see the history of present illness.  Otherwise, review of systems is positive for abdominal pain.  All other systems are reviewed and negative.   PHYSICAL EXAM: VS:  BP 136/88 mmHg  Pulse 52  Ht  (1.753 m)  Wt 219 lb (99.338 kg)  BMI 32.33 kg/m2  SpO2 98% , BMI Body mass index is 32.33 kg/(m^2). GEN: Well nourished, well developed, in no acute distress HEENT: normal Neck: no JVD, no masses. No carotid bruits Cardiac: RRR without murmur or gallop                Respiratory:  clear to auscultation bilaterally, normal work of breathing GI: soft, nontender, nondistended, + BS MS: no deformity or atrophy Ext: no pretibial edema, pedal pulses 2+= bilaterally Skin: warm and dry, no rash Neuro:  Strength and sensation are intact Psych: euthymic mood, full affect  EKG:  EKG is not ordered today.  Recent Labs: 12/04/2014: TSH 2.366 12/19/2014: Magnesium 2.2 12/23/2014: BUN 12; Creatinine 0.69; Hemoglobin 9.7*; Platelets 322; Potassium 4.7; Sodium 137 02/19/2015: ALT 38   Lipid Panel     Component Value Date/Time   CHOL 103 02/19/2015 0742   TRIG 132.0 02/19/2015 0742   HDL 34.10* 02/19/2015 0742   CHOLHDL 3 02/19/2015 0742   VLDL 26.4 02/19/2015 0742   LDLCALC 43 02/19/2015 0742      Wt Readings from Last 3 Encounters:  03/07/15 219 lb (99.338 kg)  01/17/15 215 lb (97.523 kg)  01/08/15 215 lb (97.523 kg)    Cardiac Studies Reviewed: 2D Echo 2.17.2016: Study Conclusions  - Left ventricle: The cavity size was normal. There was mild concentric hypertrophy. Systolic function was normal. The estimated ejection fraction was in the range of 55% to 60%. Images were inadequate for LV wall motion assessment. Features are consistent with a pseudonormal left ventricular  filling pattern, with concomitant abnormal relaxation and increased filling pressure (grade 2 diastolic dysfunction). - Aortic valve: There was trivial regurgitation. - Tricuspid valve: There was trivial regurgitation.  ASSESSMENT AND PLAN: 1.  CAD, native vessel: He's had a good recovery from 5 vessel CABG. He can resume all of his regular activities without any specific restrictions now that he is 3 months out from surgery. I recommended that he return in 6 months for follow-up evaluation.  2. Essential hypertension: His blood pressure has been controlled and he will continue on lisinopril and metoprolol.  3. Hyperlipidemia: Most recent lipids reviewed. He is tolerating high-dose atorvastatin. He does have elevation of his alkaline phosphatase, but other liver function tests are normal. When he returns in 6 months we will repeat LFTs and will add a GGT. If continued elevation,  would check a right upper quadrant ultrasound. Suspect he has fatty liver disease.   Current medicines are reviewed with the patient today.  The patient does not have concerns regarding medicines.  The following changes have been made:  no change  Labs/ tests ordered today include:   Orders Placed This Encounter  Procedures  . Gamma GT  . Lipid panel  . Hepatic function panel   Disposition:   FU 6 months with Tereso NewcomerScott Weaver  Signed, Tonny BollmanMichael Gearlene Godsil, MD  03/07/2015 5:38 PM    [facility] [address], [place], [place]  [address] Phone: [phone]; Fax: [phone]

## 2015-03-07 NOTE — Patient Instructions (Signed)
Medication Instructions:  Your physician recommends that you continue on your current medications as directed. Please refer to the Current Medication list given to you today.  Labwork: Your physician recommends that you return for a FASTING LIPID, LIVER and GGT in 6 MONTHS --nothing to eat or drink after midnight, lab opens at 7:30 AM  Testing/Procedures: No new orders.   Follow-Up: Your physician wants you to follow-up in: 6 MONTHS with Tereso NewcomerScott Weaver PA-C.  You will receive a reminder letter in the mail two months in advance. If you don't receive a letter, please call our office to schedule the follow-up appointment.  Your physician wants you to follow-up in: 1 YEAR with Dr Excell Seltzerooper.  You will receive a reminder letter in the mail two months in advance. If you don't receive a letter, please call our office to schedule the follow-up appointment.  Any Other Special Instructions Will Be Listed Below (If Applicable).

## 2015-06-27 ENCOUNTER — Telehealth (HOSPITAL_COMMUNITY): Payer: Self-pay | Admitting: Cardiac Rehabilitation

## 2015-06-27 NOTE — Telephone Encounter (Signed)
Three (01/03/15, 01/08/15, 01/23/15) phone attempts to enroll pt in cardiac rehab. Pt did not respond to voice message or letter mailed to pt home.

## 2015-07-29 IMAGING — CR DG CHEST 2V
2 series · 2 of 2 positions shown · non-contrast
Comparison: Chest x-ray of 12/22/2014

CLINICAL DATA: CABG on 12/18/2014, followup

EXAM:
CHEST  2 VIEW

[w chest pa]
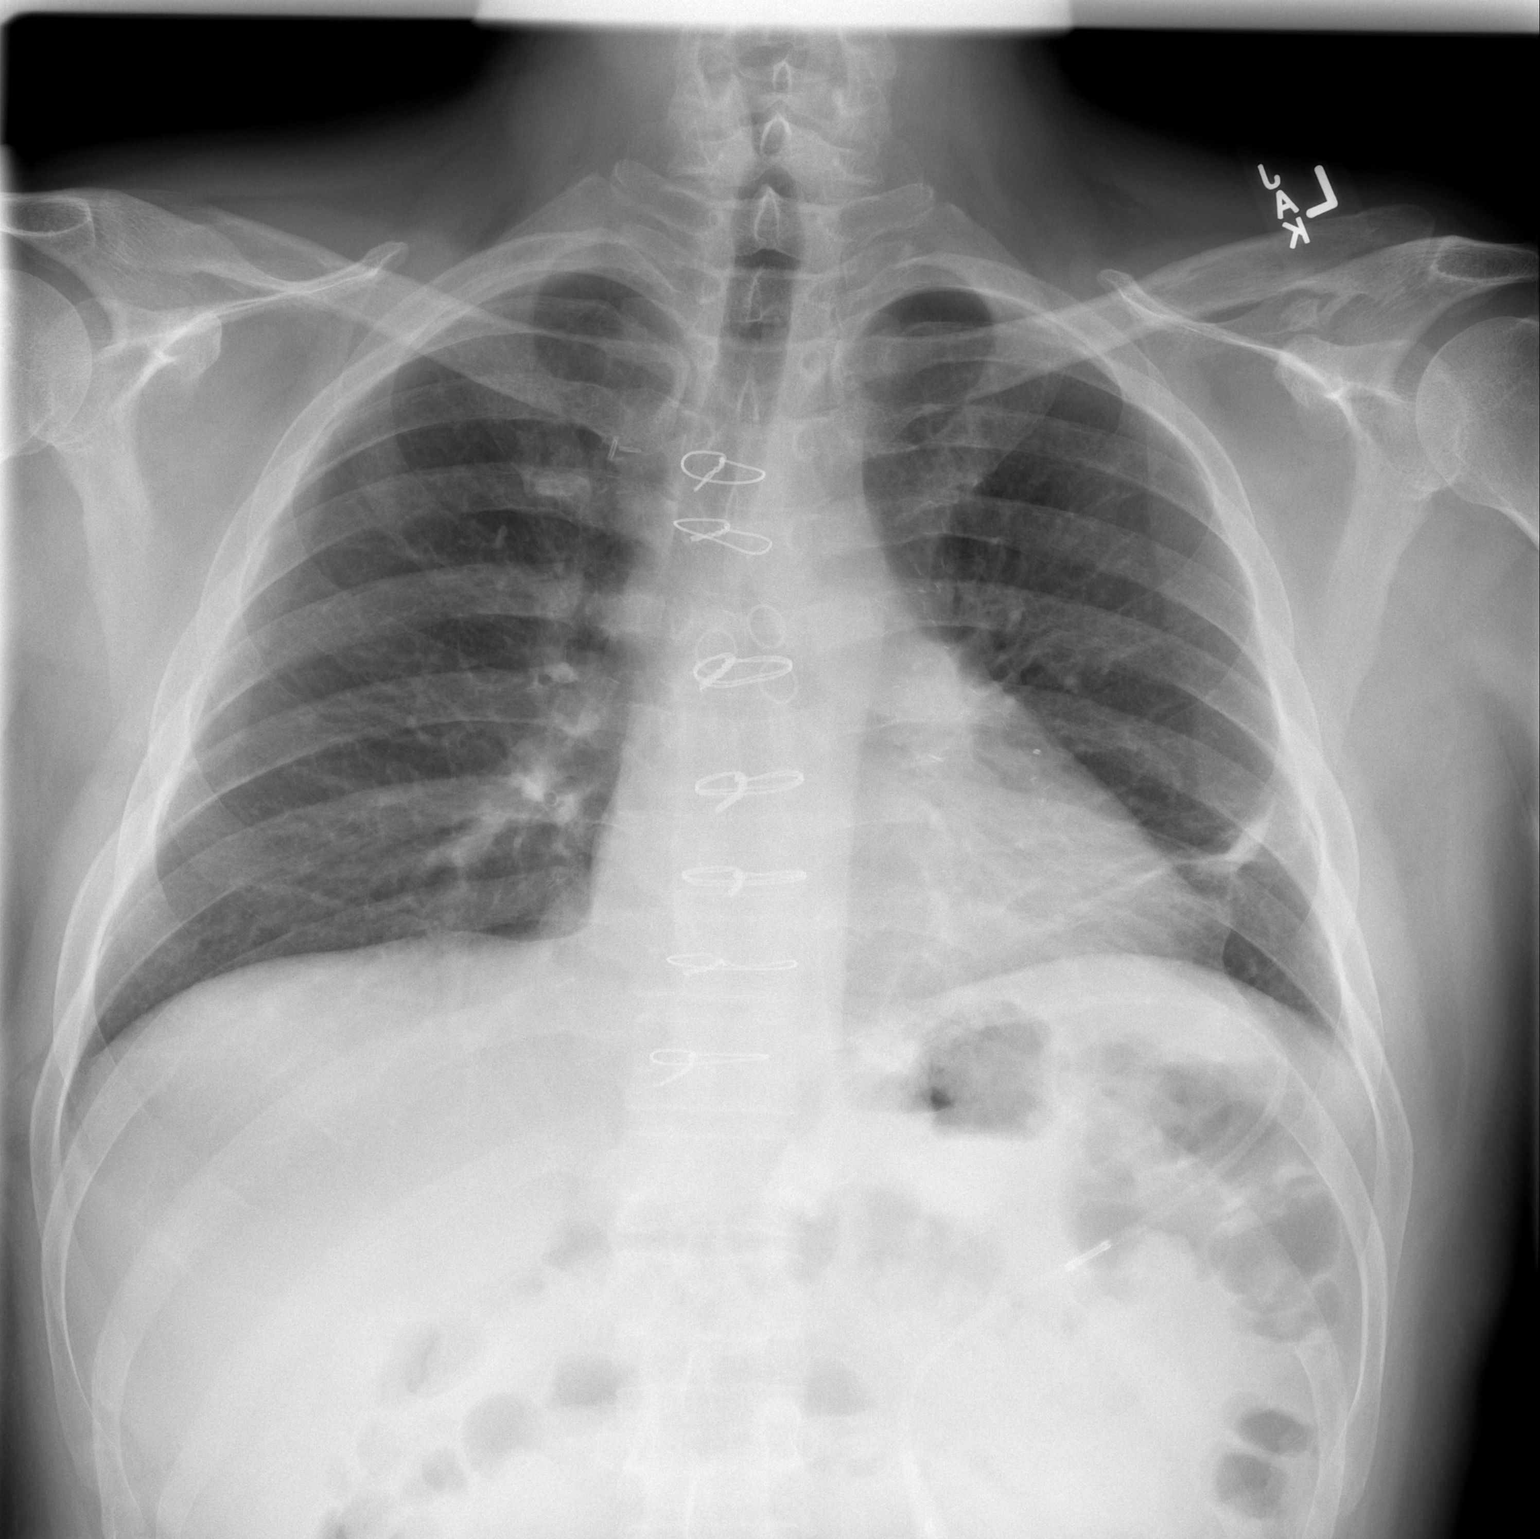

[w chest lat]
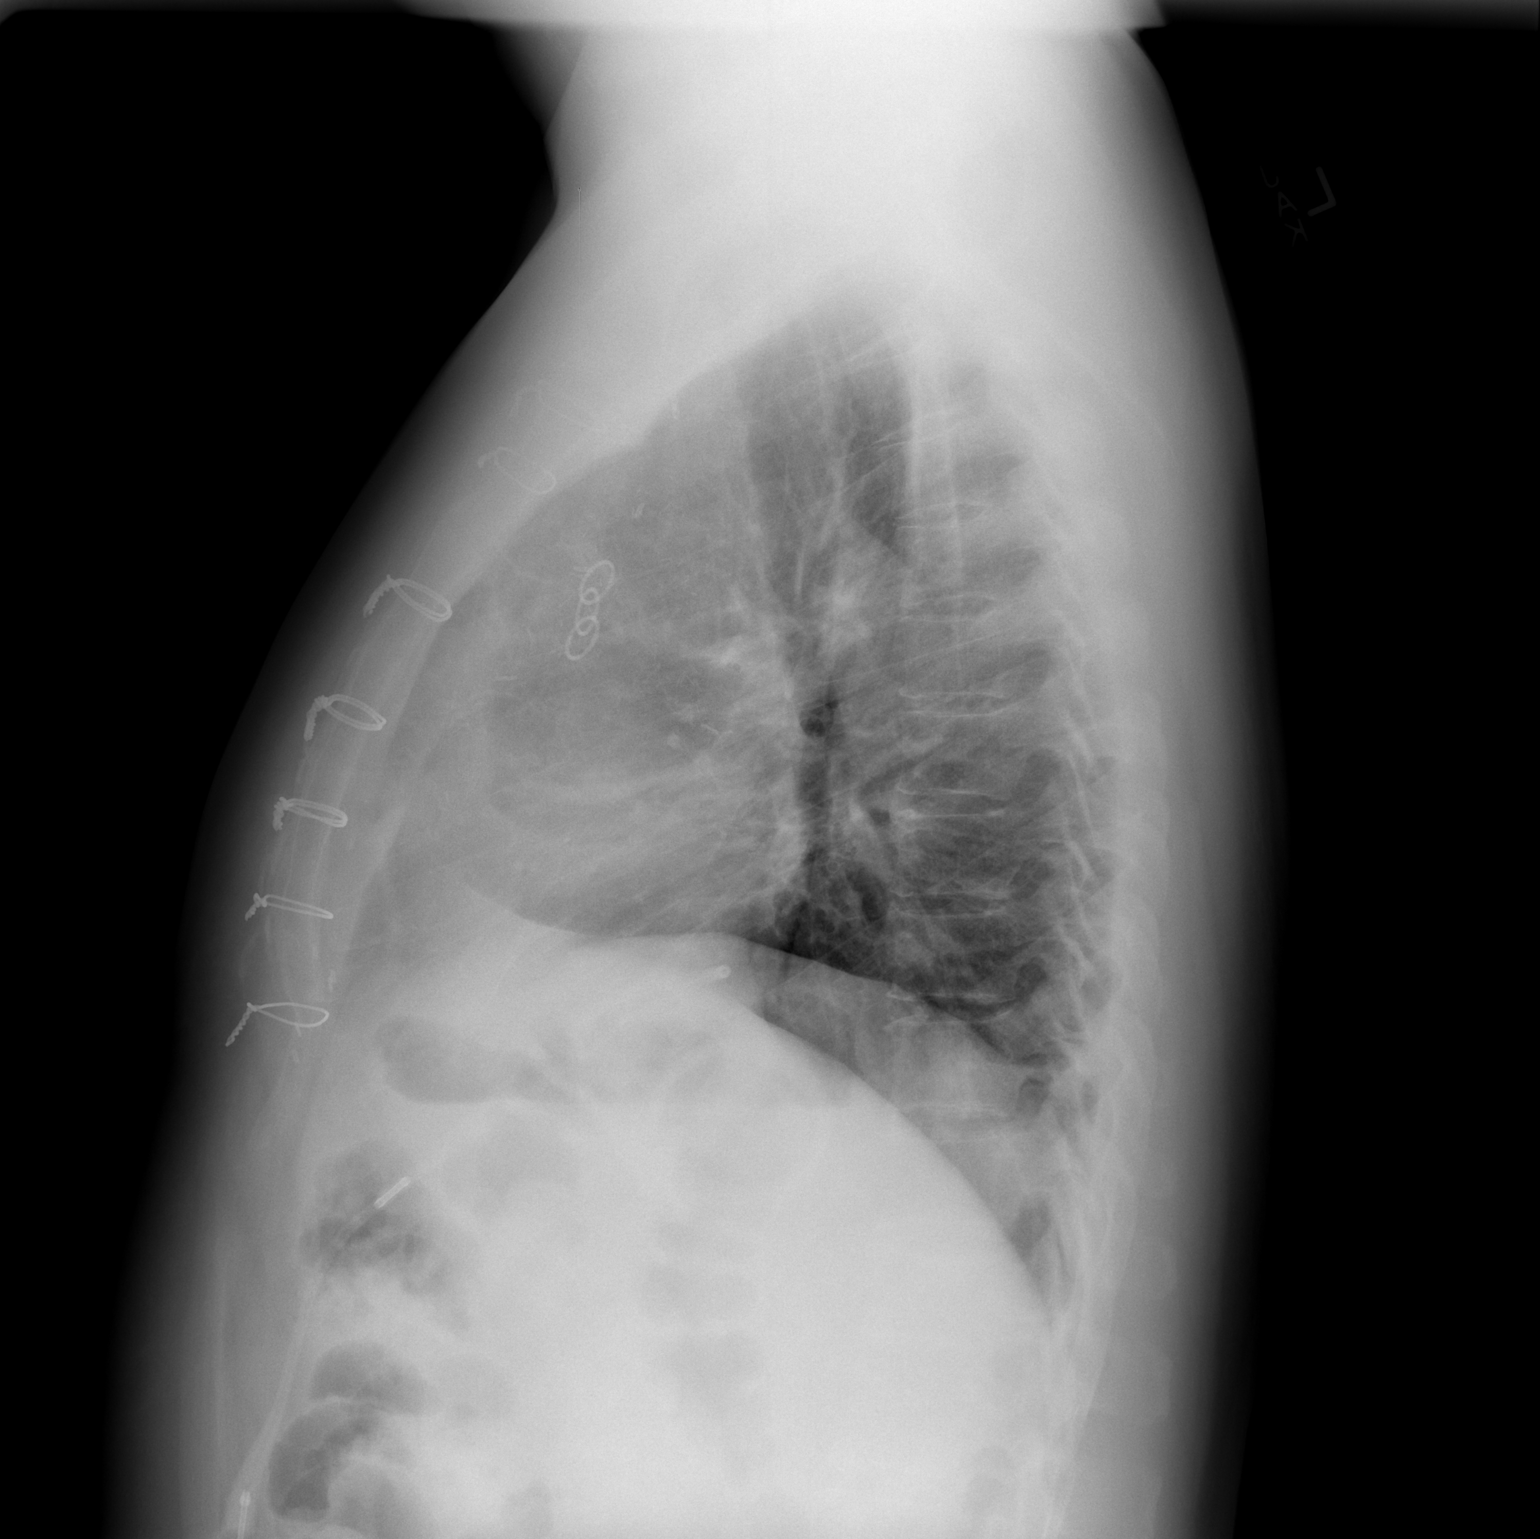

[2 of 2 positions shown; findings below may reference images not displayed]

FINDINGS: Aeration of the lungs has improved. There has been a decrease in
linear atelectasis in the left lung base. No pneumothorax is seen
and no effusion is noted. Heart size is stable.
IMPRESSION: Improved aeration with decreasing left basilar linear atelectasis.

## 2015-09-12 ENCOUNTER — Other Ambulatory Visit: Payer: BLUE CROSS/BLUE SHIELD

## 2015-09-13 ENCOUNTER — Telehealth: Payer: Self-pay | Admitting: *Deleted

## 2015-09-13 ENCOUNTER — Other Ambulatory Visit (INDEPENDENT_AMBULATORY_CARE_PROVIDER_SITE_OTHER): Payer: BLUE CROSS/BLUE SHIELD | Admitting: *Deleted

## 2015-09-13 DIAGNOSIS — I10 Essential (primary) hypertension: Secondary | ICD-10-CM | POA: Diagnosis not present

## 2015-09-13 DIAGNOSIS — I214 Non-ST elevation (NSTEMI) myocardial infarction: Secondary | ICD-10-CM | POA: Diagnosis not present

## 2015-09-13 DIAGNOSIS — I2510 Atherosclerotic heart disease of native coronary artery without angina pectoris: Secondary | ICD-10-CM | POA: Diagnosis not present

## 2015-09-13 LAB — LIPID PANEL
CHOLESTEROL: 117 mg/dL — AB (ref 125–200)
HDL: 35 mg/dL — ABNORMAL LOW (ref >=40)
LDL Cholesterol: 67 mg/dL (ref <130)
Total CHOL/HDL Ratio: 3.3 Ratio (ref <=5.0)
Triglycerides: 74 mg/dL (ref <150)
VLDL: 15 mg/dL (ref <30)

## 2015-09-13 LAB — HEPATIC FUNCTION PANEL
ALT: 18 U/L (ref 9–46)
AST: 15 U/L (ref 10–40)
Albumin: 4.2 g/dL (ref 3.6–5.1)
Alkaline Phosphatase: 102 U/L (ref 40–115)
BILIRUBIN INDIRECT: 0.4 mg/dL (ref 0.2–1.2)
Bilirubin, Direct: 0.1 mg/dL (ref <=0.2)
Total Bilirubin: 0.5 mg/dL (ref 0.2–1.2)
Total Protein: 7 g/dL (ref 6.1–8.1)

## 2015-09-13 NOTE — Addendum Note (Signed)
Addended by: Jaleia Hanke K on: 09/13/2015 08:04 AM   Modules accepted: Orders  

## 2015-09-13 NOTE — Addendum Note (Signed)
Addended by: Tonita PhoenixBOWDEN, Brion Hedges K on: 09/13/2015 08:04 AM   Modules accepted: Orders

## 2015-09-13 NOTE — Telephone Encounter (Signed)
Lmom lab work ok, continue current tx plan. [facility]

## 2015-09-18 NOTE — Progress Notes (Signed)
Cardiology Office Note   Date:  09/19/2015   ID:  Hunter Gomez, DOB [birth year], MRN [id number]  Patient Care Team: Jac Canavan, PA-C as PCP - General (Family Medicine) Tonny Bollman, MD as Consulting Physician (Cardiology)     Chief Complaint  Patient presents with  . Follow-up  . Coronary Artery Disease     History of Present Illness: Hunter Gomez is a 44 y.o. male with a hx of HTN, obesity s/p Lap Band surgery, GERD. He was admitted 11/2014 with a NSTEMI. Cardiac CTA demonstrated high Ca score with possible 2v CAD. LHC demonstrated severe 3v CAD with critical LM stenosis. He was referred for urgent CABG with Dr. Donata Clay (L-LAD, Free RIMA-RI, S-Dx, S-PDA). Last seen by Dr. Excell Seltzer 4/16. Returns for follow-up.  He is doing well. He has a URI and is somewhat short of breath. Otherwise, he denies chest pain, shortness of breath, syncope, orthopnea, PND or significant pedal edema.    Studies/Reports Reviewed Today:  Echo 01/10/15 Mild LVH, EF 55-60%, grade 2 diastolic dysfunction, trivial AI, trivial TR  Cardiac Cath 12/18/14 LM: Distal 95% extending into ostium of LAD and LCx LAD: Ostial 90%, mid 30% LCx: ostial 80-90%, proximal 50% RCA: Ostial PDA 80%, ostial PLA 75% EF 55-65% >> Emergent CABG  Carotid US 12/18/14 Bilateral ICA 1-39%  Past Medical History  Diagnosis Date  . Vision problem     prior use of glasses, not currently  . Muscle weakness 1997    prior seizure like activity, but none since; prior neurology eval, prior normal EEG 1997  . Hypertension 10/2012  . HLD (hyperlipidemia)   . Obesity     s/p Lap Band surgery  . CAD (coronary artery disease)     a.  NSTEMI (1/16):  LHC - dLM 95, oLAD 90, mLAD 30, oCFX 80-90, pCFX 50, oPDA 80, oPLA 75, EF 55-65% >> urgent CABG  . Hx of echocardiogram     Echo (2/16):  Mild LVH, EF 55-60%, Gr 2 DD, trivial AI/TR, no effusion    Past Surgical History  Procedure Laterality Date  . Tonsillectomy    .  Wisdom tooth extraction    . Laparoscopic gastric banding  2007    initial procedure Dr. Robb Matar in [place], adjustments in [place], [place]  . Laparoscopic gastric banding  2006  . Left heart catheterization with coronary angiogram N/A 12/18/2014    Procedure: LEFT HEART CATHETERIZATION WITH CORONARY ANGIOGRAM;  Surgeon: Micheline Chapman, MD;  Location: [facility] CATH LAB;  Service: Cardiovascular;  Laterality: N/A;  . Coronary artery bypass graft N/A 12/18/2014    Procedure: CORONARY ARTERY BYPASS GRAFTING (CABG), ON PUMP, TIMES FIVE, USING BILATERAL INTERNAL MAMMARY ARTERIES, RIGHT GREATER SAPHENOUS VEIN HARVESTED ENDOSCOPICALLY;  Surgeon: Kerin Perna, MD;  Location: [facility] OR;  Service: Open Heart Surgery;  Laterality: N/A;  LIMA-LAD, SVG-D, SVG-OM, Free RIMA-Ramus, SVG-PD  . Intraoperative transesophageal echocardiogram N/A 12/18/2014    Procedure: INTRAOPERATIVE TRANSESOPHAGEAL ECHOCARDIOGRAM;  Surgeon: Kerin Perna, MD;  Location: [facility] OR;  Service: Open Heart Surgery;  Laterality: N/A;     Current Outpatient Prescriptions  Medication Sig Dispense Refill  . aspirin 81 MG EC tablet Take 1 tablet (81 mg total) by mouth daily.    Marland Kitchen atorvastatin (LIPITOR) 80 MG tablet Take 1 tablet (80 mg total) by mouth daily. 30 tablet 11  . lisinopril (PRINIVIL,ZESTRIL) 10 MG tablet Take 1 tablet (10 mg total) by mouth daily. 30 tablet 11  . metoprolol (LOPRESSOR) 50  MG tablet Take 1 tablet (50 mg total) by mouth 2 (two) times daily. 60 tablet 11  . nitroGLYCERIN (NITROSTAT) 0.4 MG SL tablet Place 1 tablet (0.4 mg total) under the tongue every 5 (five) minutes as needed for chest pain. 25 tablet 11   No current facility-administered medications for this visit.    Allergies:   Review of patient's allergies indicates no known allergies.    Social History:  The patient  reports that he has never smoked. He does not have any smokeless tobacco history on file. He reports that he does not drink alcohol or use  illicit drugs.   Family History:  The patient's family history includes Cancer in his maternal grandfather and paternal grandmother; Diabetes in his mother; Heart disease (age of onset: 7) in his father; Heart disease (age of onset: 40) in his mother; Hyperlipidemia in his brother, father, and mother; Hypertension in his brother, father, and mother; Peripheral vascular disease in his mother; Stroke in his father. There is no history of Heart attack.    ROS:   Please see the history of present illness.   Review of Systems  Cardiovascular: Positive for dyspnea on exertion.  Respiratory: Positive for cough.   All other systems reviewed and are negative.     PHYSICAL EXAM: VS:  BP 136/80 mmHg  Pulse 60  Ht  (1.753 m)  Wt 233 lb (105.688 kg)  BMI 34.39 kg/m2    Wt Readings from Last 3 Encounters:  09/19/15 233 lb (105.688 kg)  03/07/15 219 lb (99.338 kg)  01/17/15 215 lb (97.523 kg)     GEN: Well nourished, well developed, in no acute distress HEENT: normal Neck: no JVD,   no masses Cardiac:  Normal S1/S2, RRR; no murmur ,  no rubs or gallops, no edema   Respiratory:  clear to auscultation bilaterally, no wheezing, rhonchi or rales. GI: soft, nontender, nondistended, + BS MS: no deformity or atrophy Skin: warm and dry  Neuro:  CNs II-XII intact, Strength and sensation are intact Psych: Normal affect   EKG:  EKG is ordered today.  It demonstrates:   NSR, HR 60, normal axis, j point elevation, QTc 390 ms, no changes.   Recent Labs: 12/04/2014: TSH 2.366 12/19/2014: Magnesium 2.2 12/23/2014: BUN 12; Creatinine, Ser 0.69; Hemoglobin 9.7*; Platelets 322; Potassium 4.7; Sodium 137 09/13/2015: ALT 18    Lipid Panel    Component Value Date/Time   CHOL 117* 09/13/2015 0804   TRIG 74 09/13/2015 0804   HDL 35* 09/13/2015 0804   CHOLHDL 3.3 09/13/2015 0804   VLDL 15 09/13/2015 0804   LDLCALC 67 09/13/2015 0804      ASSESSMENT AND PLAN:  1. CAD:  S/p CABG.  He is doing  well without angina.  He has a hard time finding an opportunity to exercise.  He works long hours and has small children.  Continue ASA, statin, ACE inhibitor, beta-blocker.   2. Hyperlipidemia:  LDL in 10/16 was 67.  LFTs at that time were ok including ALP.  At last visit, FU GGT was recommended but this was not done.  Continue current Rx.   3. HTN:  Controlled.     Medication Changes: Current medicines are reviewed at length with the patient today.  Concerns regarding medicines are as outlined above.  The following changes have been made:   Discontinued Medications   SAW PALMETTO 450 MG CAPS    Take 3 capsules by mouth daily.   Modified Medications  Modified Medication Previous Medication   ASPIRIN 81 MG EC TABLET aspirin EC 325 MG EC tablet      Take 1 tablet (81 mg total) by mouth daily.    Take 1 tablet (325 mg total) by mouth daily.   ATORVASTATIN (LIPITOR) 80 MG TABLET atorvastatin (LIPITOR) 80 MG tablet      Take 1 tablet (80 mg total) by mouth daily.    Take 1 tablet (80 mg total) by mouth daily.   LISINOPRIL (PRINIVIL,ZESTRIL) 10 MG TABLET lisinopril (PRINIVIL,ZESTRIL) 10 MG tablet      Take 1 tablet (10 mg total) by mouth daily.    Take 1 tablet (10 mg total) by mouth daily.   METOPROLOL (LOPRESSOR) 50 MG TABLET metoprolol (LOPRESSOR) 50 MG tablet      Take 1 tablet (50 mg total) by mouth 2 (two) times daily.    Take 1 tablet (50 mg total) by mouth 2 (two) times daily.   NITROGLYCERIN (NITROSTAT) 0.4 MG SL TABLET nitroGLYCERIN (NITROSTAT) 0.4 MG SL tablet      Place 1 tablet (0.4 mg total) under the tongue every 5 (five) minutes as needed for chest pain.    Place 1 tablet (0.4 mg total) under the tongue every 5 (five) minutes as needed for chest pain.   New Prescriptions   No medications on file   Labs/ tests ordered today include:   Orders Placed This Encounter  Procedures  . EKG 12-Lead     Disposition:    FU with Dr. Tonny BollmanMichael Cooper 6 mos as planned.      Signed, Brynda RimScott Glendal Cassaday, PA-C, MHS 09/19/2015 12:42 PM    [facility] [address], [place], [place]  [address] Phone: [phone]; Fax: [phone]

## 2015-09-19 ENCOUNTER — Ambulatory Visit (INDEPENDENT_AMBULATORY_CARE_PROVIDER_SITE_OTHER): Payer: BLUE CROSS/BLUE SHIELD | Admitting: Physician Assistant

## 2015-09-19 ENCOUNTER — Encounter: Payer: Self-pay | Admitting: Physician Assistant

## 2015-09-19 VITALS — BP 136/80 | HR 60 | Ht 69.0 in | Wt 233.0 lb

## 2015-09-19 DIAGNOSIS — I10 Essential (primary) hypertension: Secondary | ICD-10-CM

## 2015-09-19 DIAGNOSIS — I2510 Atherosclerotic heart disease of native coronary artery without angina pectoris: Secondary | ICD-10-CM | POA: Diagnosis not present

## 2015-09-19 DIAGNOSIS — E785 Hyperlipidemia, unspecified: Secondary | ICD-10-CM

## 2015-09-19 DIAGNOSIS — Z951 Presence of aortocoronary bypass graft: Secondary | ICD-10-CM | POA: Diagnosis not present

## 2015-09-19 MED ORDER — LISINOPRIL 10 MG PO TABS: 10.0000 mg | ORAL_TABLET | Freq: Every day | ORAL | Status: DC

## 2015-09-19 MED ORDER — ASPIRIN 81 MG PO TBEC: 81.0000 mg | DELAYED_RELEASE_TABLET | Freq: Every day | ORAL | Status: DC

## 2015-09-19 MED ORDER — ATORVASTATIN CALCIUM 80 MG PO TABS: 80.0000 mg | ORAL_TABLET | Freq: Every day | ORAL | Status: DC

## 2015-09-19 MED ORDER — METOPROLOL TARTRATE 50 MG PO TABS: 50.0000 mg | ORAL_TABLET | Freq: Two times a day (BID) | ORAL | Status: DC

## 2015-09-19 MED ORDER — NITROGLYCERIN 0.4 MG SL SUBL
0.4000 mg | SUBLINGUAL_TABLET | SUBLINGUAL | Status: DC | PRN
Start: 2015-09-19 — End: 2017-08-24

## 2015-09-19 NOTE — Patient Instructions (Signed)
Medication Instructions:  Your physician recommends that you continue on your current medications as directed. Please refer to the Current Medication list given to you today.   Labwork: NONE  Testing/Procedures: NONE  Follow-Up: WE WILL SEND OUT A REMINDER A FEW MONTHS EARLIER ASKING FOR YOU TO CALL AND MAKE AN APPT TO SEE DR. Excell SeltzerOOPER 02/2016  Any Other Special Instructions Will Be Listed Below (If Applicable).     If you need a refill on your cardiac medications before your next appointment, please call your pharmacy.

## 2015-09-25 ENCOUNTER — Other Ambulatory Visit: Payer: Self-pay | Admitting: Physician Assistant

## 2016-06-06 ENCOUNTER — Telehealth: Payer: Self-pay | Admitting: Cardiovascular Disease

## 2016-06-06 DIAGNOSIS — Z79899 Other long term (current) drug therapy: Secondary | ICD-10-CM

## 2016-06-06 DIAGNOSIS — E785 Hyperlipidemia, unspecified: Secondary | ICD-10-CM

## 2016-06-06 NOTE — Telephone Encounter (Signed)
New Message  Pt wanted lab work before 8/8 appt w/ Lorin PicketScott- no orders in syst.

## 2016-06-06 NOTE — Telephone Encounter (Signed)
PT AWARE CAN DO LABS SAME  DAY  AS  VISIT WITH  [facility] .PT  NEEDS  TO COME  IN FASTING   PT  VERBALIZED UNDERSTANDING .Hunter Gomez/CY

## 2016-06-30 NOTE — Progress Notes (Signed)
Cardiology Office Note:    Date:  07/01/2016   ID:  Hunter Gomez, DOB 1971/07/08, MRN [id number]  PCP:  Ernst BreachYSINGER, DAVID SHANE, PA-C  Cardiologist:  Dr. Tonny BollmanMichael Cooper   Electrophysiologist:  n/a  Referring MD: Jac Canavanysinger, David S, PA-C   Chief Complaint  Patient presents with  . Follow-up    CAD    History of Present Illness:    Hunter CloudBradley Aybar is a 45 y.o. male with a hx of HTN, obesity s/p Lap Band surgery, GERD. He was admitted 11/2014 with a NSTEMI. Cardiac CTA demonstrated high Ca score with possible 2v CAD. LHC demonstrated severe 3v CAD with critical LM stenosis. He was referred for urgent CABG with Dr. Donata ClayVan Trigt (L-LAD, Free RIMA-RI, S-Dx, S-PDA).   Last seen in 10/16. He returns for follow-up.  Here alone.  He is doing well.  The patient denies chest pain, shortness of breath, syncope, orthopnea, PND or significant pedal edema.  He works long hours with [employer].  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  He does not get a lot of opportunities to exercise.  Prior CV studies that were reviewed today include:    Echo 01/10/15 Mild LVH, EF 55-60%, grade 2 diastolic dysfunction, trivial AI, trivial TR  Cardiac Cath 12/18/14 LM: Distal 95% extending into ostium of LAD and LCx LAD: Ostial 90%, mid 30% LCx: ostial 80-90%, proximal 50% RCA: Ostial PDA 80%, ostial PLA 75% EF 55-65% >> Emergent CABG  Carotid US 12/18/14 Bilateral ICA 1-39%  Past Medical History:  Diagnosis Date  . CAD (coronary artery disease)    a.  NSTEMI (1/16):  LHC - dLM 95, oLAD 90, mLAD 30, oCFX 80-90, pCFX 50, oPDA 80, oPLA 75, EF 55-65% >> urgent CABG  . HLD (hyperlipidemia)   . Hx of echocardiogram    Echo (2/16):  Mild LVH, EF 55-60%, Gr 2 DD, trivial AI/TR, no effusion  . Hypertension 10/2012  . Muscle weakness 1997   prior seizure like activity, but none since; prior neurology eval, prior normal EEG 1997  . Obesity    s/p Lap Band surgery  . Vision problem    prior use of glasses, not currently    Past Surgical History:  Procedure Laterality Date  . CORONARY ARTERY BYPASS GRAFT N/A 12/18/2014   Procedure: CORONARY ARTERY BYPASS GRAFTING (CABG), ON PUMP, TIMES FIVE, USING BILATERAL INTERNAL MAMMARY ARTERIES, RIGHT GREATER SAPHENOUS VEIN HARVESTED ENDOSCOPICALLY;  Surgeon: Kerin PernaPeter Van Trigt, MD;  [facility];  Service: Open Heart Surgery;  Laterality: N/A;  LIMA-LAD, SVG-D, SVG-OM, Free RIMA-Ramus, SVG-PD  . INTRAOPERATIVE TRANSESOPHAGEAL ECHOCARDIOGRAM N/A 12/18/2014   Procedure: INTRAOPERATIVE TRANSESOPHAGEAL ECHOCARDIOGRAM;  Surgeon: Kerin PernaPeter Van Trigt, MD;  [facility];  Service: Open Heart Surgery;  Laterality: N/A;  . LAPAROSCOPIC GASTRIC BANDING  2007   initial procedure Dr. Robb [place]arrtiz in [place], [place], [place]  . LAPAROSCOPIC GASTRIC BANDING  2006  . LEFT HEART CATHETERIZATION WITH CORONARY ANGIOGRAM N/A 12/18/2014   Procedure: LEFT HEART CATHETERIZATION WITH CORONARY ANGIOGRAM;  Surgeon: Micheline ChapmanMichael D Cooper, MD;  [facility];  Service: Cardiovascular;  Laterality: N/A;  . TONSILLECTOMY    . WISDOM TOOTH EXTRACTION      Current Medications: Current Meds  Medication Sig  . aspirin 81 MG EC tablet Take 1 tablet (81 mg total) by mouth daily.  Marland Kitchen. atorvastatin (LIPITOR) 80 MG tablet Take 1 tablet (80 mg total) by mouth daily.  Marland Kitchen. lisinopril (PRINIVIL,ZESTRIL)  10 MG tablet Take 1 tablet (10 mg total) by mouth daily.  . metoprolol (LOPRESSOR) 50 MG tablet Take 1 tablet (50 mg total) by mouth 2 (two) times daily.  . nitroGLYCERIN (NITROSTAT) 0.4 MG SL tablet Place 1 tablet (0.4 mg total) under the tongue every 5 (five) minutes as needed for chest pain.       Allergies:   Review of patient's allergies indicates no known allergies.   Social History   Social History  . Marital status: Married    Spouse name: N/A  . Number of children: N/A  . Years of education: N/A   Social History Main Topics  . Smoking status:  Never Smoker  . Smokeless tobacco: Never Used  . Alcohol use No  . Drug use: No  . Sexual activity: Not Asked   Other Topics Concern  . 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   ROS:   Please see the history of present illness.    ROS All other systems reviewed and are negative.   EKGs/Labs/Other Test Reviewed:    EKG:  EKG is  ordered today.  The ekg ordered today demonstrates Sinus bradycardia, HR 55, normal axis, early repolarization, QTc 376 ms, no significant change compared to prior tracing  Recent Labs: 09/13/2015: ALT 18   Recent Lipid Panel    Component Value Date/Time   CHOL 117 (L) 09/13/2015 0804   TRIG 74 09/13/2015 0804   HDL 35 (L) 09/13/2015 0804   CHOLHDL 3.3 09/13/2015 0804   VLDL 15 09/13/2015 0804   LDLCALC 67 09/13/2015 0804     Physical Exam:    VS:  BP 130/84   Pulse (!) 55   Ht  (1.753 m)   Wt 240 lb 12.8 oz (109.2 kg)   BMI 35.56 kg/m     Wt Readings from Last 3 Encounters:  07/01/16 240 lb 12.8 oz (109.2 kg)  09/19/15 233 lb (105.7 kg)  03/07/15 219 lb (99.3 kg)     Physical Exam  Constitutional: He is oriented to person, place, and time. He appears well-developed and well-nourished. No distress.  HENT:  Head: Normocephalic and atraumatic.  Eyes: No scleral icterus.  Neck: Normal range of motion. No JVD present. Carotid bruit is not present.  Cardiovascular: Normal rate, regular rhythm, S1 normal and S2 normal.  Exam reveals no  gallop and no friction rub.   No murmur heard. Pulmonary/Chest: Effort normal and breath sounds normal. He has no wheezes. He has no rhonchi. He has no rales.  Abdominal: Soft. He exhibits no distension and no mass. There is no tenderness.  Musculoskeletal: Normal range of motion. He exhibits no edema.  Lymphadenopathy:    He has no cervical adenopathy.  Neurological: He is alert and oriented to person, place, and time.  Skin: Skin is warm and dry.  Psychiatric: He has a normal mood and affect.    ASSESSMENT:    1. Coronary artery disease involving native coronary artery of native heart without angina pectoris  2. Essential hypertension   3. Hyperlipidemia    PLAN:    In order of problems listed above:  1. CAD:  S/p CABG.  He is doing well. He denies angina. Continue ASA, statin, ACE inhibitor, beta-blocker.   2. Hyperlipidemia: Continue high-dose statin. Check follow-up lipids and LFTs today.  3. HTN:  Controlled. Continue current regimen. Check follow-up BMET.   Medication Adjustments/Labs and Tests Ordered: Current medicines are reviewed at length with the patient today.  Concerns regarding medicines are outlined above.  Medication changes, Labs and Tests ordered today are outlined in the Patient Instructions noted below. Patient Instructions  Medication Instructions:  No changes.  See your medication list.  Labwork: Today - BMET, Lipids, LFTs  Testing/Procedures: None   Follow-Up: Dr. Tonny Bollman or [name], PA-C in 1 year.   Any Other Special Instructions Will Be Listed Below (If Applicable).  If you need a refill on your cardiac medications before your next appointment, please call your pharmacy.   Signed, [name], PA-C  07/01/2016 11:08 AM    [facility] [address] [place], [place], [place]  [address] Phone: [phone]; Fax: [phone]

## 2016-07-01 ENCOUNTER — Ambulatory Visit (INDEPENDENT_AMBULATORY_CARE_PROVIDER_SITE_OTHER): Payer: BLUE CROSS/BLUE SHIELD | Admitting: Physician Assistant

## 2016-07-01 ENCOUNTER — Encounter: Payer: Self-pay | Admitting: Physician Assistant

## 2016-07-01 ENCOUNTER — Telehealth: Payer: Self-pay | Admitting: *Deleted

## 2016-07-01 ENCOUNTER — Other Ambulatory Visit: Payer: BLUE CROSS/BLUE SHIELD | Admitting: *Deleted

## 2016-07-01 VITALS — BP 130/84 | HR 55 | Ht 69.0 in | Wt 240.8 lb

## 2016-07-01 DIAGNOSIS — I10 Essential (primary) hypertension: Secondary | ICD-10-CM | POA: Diagnosis not present

## 2016-07-01 DIAGNOSIS — E785 Hyperlipidemia, unspecified: Secondary | ICD-10-CM

## 2016-07-01 DIAGNOSIS — I2510 Atherosclerotic heart disease of native coronary artery without angina pectoris: Secondary | ICD-10-CM

## 2016-07-01 DIAGNOSIS — Z79899 Other long term (current) drug therapy: Secondary | ICD-10-CM

## 2016-07-01 LAB — LIPID PANEL
CHOL/HDL RATIO: 3.4 ratio (ref <=5.0)
CHOLESTEROL: 118 mg/dL — AB (ref 125–200)
HDL: 35 mg/dL — ABNORMAL LOW (ref >=40)
LDL Cholesterol: 51 mg/dL (ref <130)
Triglycerides: 159 mg/dL — ABNORMAL HIGH (ref <150)
VLDL: 32 mg/dL — AB (ref <30)

## 2016-07-01 LAB — BASIC METABOLIC PANEL
BUN: 12 mg/dL (ref 7–25)
CALCIUM: 8.9 mg/dL (ref 8.6–10.3)
CO2: 28 mmol/L (ref 20–31)
CREATININE: 0.81 mg/dL (ref 0.60–1.35)
Chloride: 105 mmol/L (ref 98–110)
GLUCOSE: 98 mg/dL (ref 65–99)
Potassium: 4.9 mmol/L (ref 3.5–5.3)
Sodium: 140 mmol/L (ref 135–146)

## 2016-07-01 LAB — HEPATIC FUNCTION PANEL
ALBUMIN: 4.5 g/dL (ref 3.6–5.1)
ALT: 44 U/L (ref 9–46)
AST: 30 U/L (ref 10–40)
Alkaline Phosphatase: 116 U/L — ABNORMAL HIGH (ref 40–115)
BILIRUBIN DIRECT: 0.1 mg/dL (ref <=0.2)
BILIRUBIN INDIRECT: 0.5 mg/dL (ref 0.2–1.2)
BILIRUBIN TOTAL: 0.6 mg/dL (ref 0.2–1.2)
Total Protein: 6.9 g/dL (ref 6.1–8.1)

## 2016-07-01 NOTE — Telephone Encounter (Signed)
Lmtcb to go over lab results 

## 2016-07-01 NOTE — Patient Instructions (Signed)
Medication Instructions:  No changes.  See your medication list.  Labwork: Today - BMET, Lipids, LFTs  Testing/Procedures: None   Follow-Up: Dr. Tonny BollmanMichael Cooper or Tereso NewcomerScott Shermon Bozzi, PA-C in 1 year.   Any Other Special Instructions Will Be Listed Below (If Applicable).  If you need a refill on your cardiac medications before your next appointment, please call your pharmacy.

## 2016-09-21 ENCOUNTER — Other Ambulatory Visit: Payer: Self-pay | Admitting: Physician Assistant

## 2016-09-21 DIAGNOSIS — I2510 Atherosclerotic heart disease of native coronary artery without angina pectoris: Secondary | ICD-10-CM

## 2016-10-14 ENCOUNTER — Other Ambulatory Visit: Payer: Self-pay | Admitting: Physician Assistant

## 2016-10-14 DIAGNOSIS — I2510 Atherosclerotic heart disease of native coronary artery without angina pectoris: Secondary | ICD-10-CM

## 2016-12-27 ENCOUNTER — Encounter (HOSPITAL_COMMUNITY): Payer: Self-pay | Admitting: Emergency Medicine

## 2016-12-27 ENCOUNTER — Inpatient Hospital Stay (HOSPITAL_COMMUNITY)
Admission: EM | Admit: 2016-12-27 | Discharge: 2017-01-01 | DRG: 418 | Disposition: A | Discharge status: Home / Self Care | Payer: BLUE CROSS/BLUE SHIELD | Attending: Internal Medicine | Admitting: Internal Medicine

## 2016-12-27 ENCOUNTER — Emergency Department (HOSPITAL_COMMUNITY): Payer: BLUE CROSS/BLUE SHIELD

## 2016-12-27 DIAGNOSIS — Z79899 Other long term (current) drug therapy: Secondary | ICD-10-CM | POA: Diagnosis not present

## 2016-12-27 DIAGNOSIS — R079 Chest pain, unspecified: Secondary | ICD-10-CM | POA: Diagnosis not present

## 2016-12-27 DIAGNOSIS — D72829 Elevated white blood cell count, unspecified: Secondary | ICD-10-CM | POA: Diagnosis present

## 2016-12-27 DIAGNOSIS — K8050 Calculus of bile duct without cholangitis or cholecystitis without obstruction: Secondary | ICD-10-CM | POA: Diagnosis present

## 2016-12-27 DIAGNOSIS — I25810 Atherosclerosis of coronary artery bypass graft(s) without angina pectoris: Secondary | ICD-10-CM | POA: Diagnosis not present

## 2016-12-27 DIAGNOSIS — Z6835 Body mass index (BMI) 35.0-35.9, adult: Secondary | ICD-10-CM | POA: Diagnosis not present

## 2016-12-27 DIAGNOSIS — E781 Pure hyperglyceridemia: Secondary | ICD-10-CM | POA: Diagnosis present

## 2016-12-27 DIAGNOSIS — Z7982 Long term (current) use of aspirin: Secondary | ICD-10-CM | POA: Diagnosis not present

## 2016-12-27 DIAGNOSIS — Z9884 Bariatric surgery status: Secondary | ICD-10-CM

## 2016-12-27 DIAGNOSIS — I252 Old myocardial infarction: Secondary | ICD-10-CM

## 2016-12-27 DIAGNOSIS — R7401 Elevation of levels of liver transaminase levels: Secondary | ICD-10-CM | POA: Diagnosis present

## 2016-12-27 DIAGNOSIS — I10 Essential (primary) hypertension: Secondary | ICD-10-CM | POA: Diagnosis present

## 2016-12-27 DIAGNOSIS — I2510 Atherosclerotic heart disease of native coronary artery without angina pectoris: Secondary | ICD-10-CM | POA: Diagnosis not present

## 2016-12-27 DIAGNOSIS — K851 Biliary acute pancreatitis without necrosis or infection: Principal | ICD-10-CM | POA: Diagnosis present

## 2016-12-27 DIAGNOSIS — R740 Nonspecific elevation of levels of transaminase and lactic acid dehydrogenase [LDH]: Secondary | ICD-10-CM | POA: Diagnosis not present

## 2016-12-27 DIAGNOSIS — K859 Acute pancreatitis without necrosis or infection, unspecified: Secondary | ICD-10-CM | POA: Diagnosis not present

## 2016-12-27 DIAGNOSIS — R17 Unspecified jaundice: Secondary | ICD-10-CM

## 2016-12-27 DIAGNOSIS — E785 Hyperlipidemia, unspecified: Secondary | ICD-10-CM | POA: Diagnosis not present

## 2016-12-27 DIAGNOSIS — K8070 Calculus of gallbladder and bile duct without cholecystitis without obstruction: Secondary | ICD-10-CM | POA: Diagnosis not present

## 2016-12-27 DIAGNOSIS — E669 Obesity, unspecified: Secondary | ICD-10-CM | POA: Diagnosis present

## 2016-12-27 DIAGNOSIS — Z951 Presence of aortocoronary bypass graft: Secondary | ICD-10-CM | POA: Diagnosis not present

## 2016-12-27 DIAGNOSIS — K8010 Calculus of gallbladder with chronic cholecystitis without obstruction: Secondary | ICD-10-CM | POA: Diagnosis not present

## 2016-12-27 DIAGNOSIS — K8020 Calculus of gallbladder without cholecystitis without obstruction: Secondary | ICD-10-CM | POA: Diagnosis present

## 2016-12-27 DIAGNOSIS — R1013 Epigastric pain: Secondary | ICD-10-CM | POA: Diagnosis not present

## 2016-12-27 DIAGNOSIS — K760 Fatty (change of) liver, not elsewhere classified: Secondary | ICD-10-CM | POA: Diagnosis present

## 2016-12-27 DIAGNOSIS — K219 Gastro-esophageal reflux disease without esophagitis: Secondary | ICD-10-CM | POA: Diagnosis not present

## 2016-12-27 DIAGNOSIS — K8062 Calculus of gallbladder and bile duct with acute cholecystitis without obstruction: Secondary | ICD-10-CM | POA: Diagnosis not present

## 2016-12-27 HISTORY — DX: Fatty (change of) liver, not elsewhere classified: K76.0

## 2016-12-27 LAB — CBC
HCT: 41.1 % (ref 39.0–52.0)
HEMOGLOBIN: 13.9 g/dL (ref 13.0–17.0)
MCH: 28.3 pg (ref 26.0–34.0)
MCHC: 33.8 g/dL (ref 30.0–36.0)
MCV: 83.7 fL (ref 78.0–100.0)
PLATELETS: 262 10*3/uL (ref 150–400)
RBC: 4.91 MIL/uL (ref 4.22–5.81)
RDW: 13.5 % (ref 11.5–15.5)
WBC: 18.8 10*3/uL — AB (ref 4.0–10.5)

## 2016-12-27 LAB — URINALYSIS, ROUTINE W REFLEX MICROSCOPIC
Bilirubin Urine: NEGATIVE
Glucose, UA: NEGATIVE mg/dL
Hgb urine dipstick: NEGATIVE
Ketones, ur: 5 mg/dL — AB
LEUKOCYTES UA: NEGATIVE
Nitrite: NEGATIVE
PROTEIN: NEGATIVE mg/dL
SPECIFIC GRAVITY, URINE: 1.024 (ref 1.005–1.030)
pH: 5 (ref 5.0–8.0)

## 2016-12-27 LAB — I-STAT TROPONIN, ED: TROPONIN I, POC: 0 ng/mL (ref 0.00–0.08)

## 2016-12-27 LAB — COMPREHENSIVE METABOLIC PANEL
ALT: 124 U/L — AB (ref 17–63)
ANION GAP: 13 (ref 5–15)
AST: 67 U/L — ABNORMAL HIGH (ref 15–41)
Albumin: 4.4 g/dL (ref 3.5–5.0)
Alkaline Phosphatase: 171 U/L — ABNORMAL HIGH (ref 38–126)
BUN: 15 mg/dL (ref 6–20)
CALCIUM: 9.2 mg/dL (ref 8.9–10.3)
CHLORIDE: 101 mmol/L (ref 101–111)
CO2: 24 mmol/L (ref 22–32)
CREATININE: 0.79 mg/dL (ref 0.61–1.24)
Glucose, Bld: 135 mg/dL — ABNORMAL HIGH (ref 65–99)
Potassium: 3.8 mmol/L (ref 3.5–5.1)
SODIUM: 138 mmol/L (ref 135–145)
Total Bilirubin: 1.7 mg/dL — ABNORMAL HIGH (ref 0.3–1.2)
Total Protein: 7 g/dL (ref 6.5–8.1)

## 2016-12-27 LAB — LIPID PANEL
CHOL/HDL RATIO: 3.6 ratio
Cholesterol: 127 mg/dL (ref 0–200)
HDL: 35 mg/dL — ABNORMAL LOW (ref >40)
LDL CALC: 61 mg/dL (ref 0–99)
TRIGLYCERIDES: 157 mg/dL — AB (ref <150)
VLDL: 31 mg/dL (ref 0–40)

## 2016-12-27 LAB — TSH: TSH: 2.415 u[IU]/mL (ref 0.350–4.500)

## 2016-12-27 LAB — LIPASE, BLOOD: LIPASE: 789 U/L — AB (ref 11–51)

## 2016-12-27 MED ORDER — MORPHINE SULFATE (PF) 4 MG/ML IV SOLN
4.0000 mg | Freq: Once | INTRAVENOUS | Status: AC
Start: 2016-12-27 — End: 2016-12-27
  Administered 2016-12-27: 4 mg via INTRAVENOUS
  Filled 2016-12-27: qty 1

## 2016-12-27 MED ORDER — SODIUM CHLORIDE 0.9% FLUSH
3.0000 mL | Freq: Two times a day (BID) | INTRAVENOUS | Status: DC
  Administered 2016-12-27 – 2017-01-01 (×8): 3 mL via INTRAVENOUS

## 2016-12-27 MED ORDER — MORPHINE SULFATE (PF) 2 MG/ML IV SOLN
2.0000 mg | INTRAVENOUS | Status: DC | PRN
  Administered 2016-12-27 – 2016-12-31 (×10): 2 mg via INTRAVENOUS
  Filled 2016-12-27 (×12): qty 1

## 2016-12-27 MED ORDER — CIPROFLOXACIN IN D5W 400 MG/200ML IV SOLN
400.0000 mg | Freq: Two times a day (BID) | INTRAVENOUS | Status: DC
  Filled 2016-12-27: qty 200

## 2016-12-27 MED ORDER — PIPERACILLIN-TAZOBACTAM 3.375 G IVPB
3.3750 g | Freq: Three times a day (TID) | INTRAVENOUS | Status: DC
  Administered 2016-12-27 – 2016-12-28 (×5): 3.375 g via INTRAVENOUS
  Filled 2016-12-27 (×6): qty 50

## 2016-12-27 MED ORDER — SODIUM CHLORIDE 0.45 % IV SOLN
INTRAVENOUS | Status: DC
  Administered 2016-12-27: 07:00:00 via INTRAVENOUS
  Administered 2016-12-28 (×2): 125 mL/h via INTRAVENOUS
  Administered 2016-12-30: 23:00:00 via INTRAVENOUS
  Administered 2016-12-31: 75 mL/h via INTRAVENOUS

## 2016-12-27 MED ORDER — GI COCKTAIL ~~LOC~~
30.0000 mL | Freq: Once | ORAL | Status: AC
  Administered 2016-12-27: 30 mL via ORAL
  Filled 2016-12-27: qty 30

## 2016-12-27 MED ORDER — ONDANSETRON HCL 4 MG PO TABS: 4.0000 mg | ORAL_TABLET | Freq: Four times a day (QID) | ORAL | Status: DC | PRN

## 2016-12-27 MED ORDER — ACETAMINOPHEN 650 MG RE SUPP: 650.0000 mg | Freq: Four times a day (QID) | RECTAL | Status: DC | PRN

## 2016-12-27 MED ORDER — ACETAMINOPHEN 325 MG PO TABS: 650.0000 mg | ORAL_TABLET | Freq: Four times a day (QID) | ORAL | Status: DC | PRN

## 2016-12-27 MED ORDER — ASPIRIN 81 MG PO CHEW
324.0000 mg | CHEWABLE_TABLET | Freq: Once | ORAL | Status: AC
  Administered 2016-12-27: 324 mg via ORAL
  Filled 2016-12-27: qty 4

## 2016-12-27 MED ORDER — MORPHINE SULFATE (PF) 4 MG/ML IV SOLN
4.0000 mg | INTRAVENOUS | Status: DC | PRN
  Administered 2016-12-27: 4 mg via INTRAVENOUS
  Filled 2016-12-27: qty 1

## 2016-12-27 MED ORDER — ONDANSETRON HCL 4 MG/2ML IJ SOLN: 4.0000 mg | Freq: Four times a day (QID) | INTRAMUSCULAR | Status: DC | PRN

## 2016-12-27 MED ORDER — SODIUM CHLORIDE 0.45 % IV SOLN: INTRAVENOUS | Status: DC

## 2016-12-27 MED ORDER — ONDANSETRON HCL 4 MG/2ML IJ SOLN
4.0000 mg | Freq: Once | INTRAMUSCULAR | Status: AC
  Administered 2016-12-27: 4 mg via INTRAVENOUS
  Filled 2016-12-27: qty 2

## 2016-12-27 NOTE — ED Provider Notes (Signed)
[facility]-EMER[facility]ENCY DEPT Provider Note   CSN: [id number] Arrival date & time: 12/27/16  0034  By signing my name below, I, Rosario Adie, attest that this documentation has been prepared under the direction and in the presence of Melene Plan, DO. Electronically Signed: Rosario Adie, ED Scribe. 12/27/16. 1:32 AM.  History   Chief Complaint Chief Complaint  Patient presents with  . Abdominal Pain   The history is provided by the patient and medical records. No language interpreter was used.  Abdominal Pain   This is a new problem. The current episode started yesterday. The problem occurs constantly. The problem has not changed since onset.The pain is located in the epigastric region, RLQ and RUQ. The pain is at a severity of 8/10. Pertinent negatives include fever, diarrhea, nausea, vomiting, constipation, dysuria, frequency, hematuria, headaches, arthralgias and myalgias. Nothing aggravates the symptoms. Nothing relieves the symptoms. Past workup includes surgery.    HPI Comments: Hunter Gomez is a 46 y.o. male with a h/o CAD s/p CABG x5 following prior MI on 12/15/14, HTN, HLD, and obesity s/p gastric band placement, who presents to the Emergency Department complaining of gradual onset, persistent epigastric and lower abdominal pain beginning approximately one day ago. He describes his pain pressure-like and intermittently sharp. Pt's pain radiates into his back. He was sitting during the onset of his pain. There are no alleviating or exacerbating factors. Pt took anti-flatus medication and one dose of NTG (dosed at 2350 on 12/26/16) prior to coming into the ED without relief. His pain is not exacerbated with urine voids and is non-exertional. Pt is s/p gastric band placement, but otherwise has no PSHx to the abdomen. He denies nausea, vomiting, diarrhea, fever, constipation, urgency, frequency, hematuria, dysuria, difficulty urinating, or any other associated symptoms.   Past Medical  History:  Diagnosis Date  . CAD (coronary artery disease)    a.  NSTEMI (1/16):  LHC - dLM 95, oLAD 90, mLAD 30, oCFX 80-90, pCFX 50, oPDA 80, oPLA 75, EF 55-65% >> urgent CABG  . HLD (hyperlipidemia)   . Hx of echocardiogram    Echo (2/16):  Mild LVH, EF 55-60%, Gr 2 DD, trivial AI/TR, no effusion  . Hypertension 10/2012  . Muscle weakness 1997   prior seizure like activity, but none since; prior neurology eval, prior normal EEG 1997  . Obesity    s/p Lap Band surgery  . Vision problem    prior use of glasses, not currently   Patient Active Problem List   Diagnosis Date Noted  . Choledocholithiasis 12/27/2016  . HLD (hyperlipidemia) 01/08/2015  . CAD (coronary artery disease)   . 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 NSTEMI (non-ST elevated myocardial infarction) (HCC) 12/15/2014  . Essential hypertension, benign 03/16/2013  . Obesity, unspecified 03/16/2013   Past Surgical History:  Procedure Laterality Date  . CORONARY ARTERY BYPASS GRAFT N/A 12/18/2014   Procedure: CORONARY ARTERY BYPASS GRAFTING (CABG), ON PUMP, TIMES FIVE, USING BILATERAL INTERNAL MAMMARY ARTERIES, RIGHT GREATER SAPHENOUS VEIN HARVESTED ENDOSCOPICALLY;  Surgeon: Kerin Perna, MD;  Location: [facility] OR;  Service: Open Heart Surgery;  Laterality: N/A;  LIMA-LAD, SVG-D, SVG-OM, Free RIMA-Ramus, SVG-PD  . INTRAOPERATIVE TRANSESOPHAGEAL ECHOCARDIOGRAM N/A 12/18/2014   Procedure: INTRAOPERATIVE TRANSESOPHAGEAL ECHOCARDIOGRAM;  Surgeon: Kerin Perna, MD;  Location: [facility];  Service: Open Heart Surgery;  Laterality: N/A;  . LAPAROSCOPIC GASTRIC BANDING  2007   initial procedure Dr. Robb Matar in [place], adjustments in [place], [place]  . LAPAROSCOPIC GASTRIC BANDING  2006  . LEFT HEART CATHETERIZATION WITH CORONARY ANGIOGRAM N/A 12/18/2014   Procedure: LEFT HEART CATHETERIZATION WITH CORONARY ANGIOGRAM;  Surgeon: Micheline Chapman, MD;  Location: [facility] CATH LAB;  Service: Cardiovascular;  Laterality: N/A;  . TONSILLECTOMY    .  WISDOM TOOTH EXTRACTION      Home Medications    Prior to Admission medications   Medication Sig Start Date End Date Taking? Authorizing Provider  aspirin 81 MG EC tablet Take 1 tablet (81 mg total) by mouth daily. 09/19/15  Yes Beatrice Lecher, PA-C  atorvastatin (LIPITOR) 80 MG tablet TAKE 1 TABLET(80 MG) BY MOUTH DAILY 10/14/16  Yes Beatrice Lecher, PA-C  lisinopril (PRINIVIL,ZESTRIL) 10 MG tablet TAKE 1 TABLET(10 MG) BY MOUTH DAILY 09/22/16  Yes Beatrice Lecher, PA-C  metoprolol (LOPRESSOR) 50 MG tablet TAKE 1 TABLET(50 MG) BY MOUTH TWICE DAILY 09/22/16  Yes Beatrice Lecher, PA-C  nitroGLYCERIN (NITROSTAT) 0.4 MG SL tablet Place 1 tablet (0.4 mg total) under the tongue every 5 (five) minutes as needed for chest pain. 09/19/15  Yes Beatrice Lecher, PA-C   Family History Family History  Problem Relation Age of Onset  . Diabetes Mother   . Hypertension Mother   . Hyperlipidemia Mother   . Heart disease Mother 71    CABG  . Peripheral vascular disease Mother   . Hypertension Father   . Hyperlipidemia Father   . Heart disease Father 56    CABG  . Stroke Father   . Hypertension Brother   . Hyperlipidemia Brother   . Cancer Maternal Grandfather     stomach  . Cancer Paternal Grandmother   . Heart attack Neg Hx    Social History Social History  Substance Use Topics  . Smoking status: Never Smoker  . Smokeless tobacco: Never Used  . Alcohol use No   Allergies   Patient has no known allergies.  Review of Systems Review of Systems  Constitutional: Negative for chills and fever.  HENT: Negative for congestion and facial swelling.   Eyes: Negative for discharge and visual disturbance.  Respiratory: Negative for shortness of breath.   Cardiovascular: Negative for chest pain and palpitations.  Gastrointestinal: Positive for abdominal pain. Negative for constipation, diarrhea, nausea and vomiting.  Genitourinary: Negative for difficulty urinating, dysuria, frequency, hematuria and  urgency.  Musculoskeletal: Negative for arthralgias and myalgias.  Skin: Negative for color change and rash.  Neurological: Negative for tremors, syncope and headaches.  Psychiatric/Behavioral: Negative for confusion and dysphoric mood.  All other systems reviewed and are negative.  Physical Exam Updated Vital Signs BP 116/61 (BP Location: Left Arm)   Pulse (!) 51   Temp 97.8 F (36.6 C) (Oral)   Resp 16   Ht 5\' 9"  (1.753 m)   Wt 237 lb (107.5 kg)   SpO2 100%   BMI 35.00 kg/m   Physical Exam  Constitutional: He is oriented to person, place, and time. He appears well-developed and well-nourished.  HENT:  Head: Normocephalic and atraumatic.  Eyes: EOM are normal. Pupils are equal, round, and reactive to light.  Neck: Normal range of motion. Neck supple. No JVD present.  Cardiovascular: Normal rate and regular rhythm.  Exam reveals no gallop and no friction rub.   No murmur heard. Pulmonary/Chest: No respiratory distress. He has no wheezes.  Abdominal: Soft. He exhibits no distension. There is tenderness. There is no rebound and no guarding.  Diffusely tender about the abdomen, worse about the epigastrium. Negative Murphy's sign.  Musculoskeletal: Normal range of motion.  Neurological: He is alert and oriented to person, place, and time.  Skin: No rash noted. No pallor.  Psychiatric: He has a normal mood and affect. His behavior is normal.  Nursing note and vitals reviewed.  ED Treatments / Results  DIAGNOSTIC STUDIES: Oxygen Saturation is 98% on RA, normal by my interpretation.   COORDINATION OF CARE: 1:31 AM-Discussed next steps with pt. Pt verbalized understanding and is agreeable with the plan.   Labs (all labs ordered are listed, but only abnormal results are displayed) Labs Reviewed  LIPASE, BLOOD - Abnormal; Notable for the following:       Result Value   Lipase 789 (*)    All other components within normal limits  COMPREHENSIVE METABOLIC PANEL - Abnormal;  Notable for the following:    Glucose, Bld 135 (*)    AST 67 (*)    ALT 124 (*)    Alkaline Phosphatase 171 (*)    Total Bilirubin 1.7 (*)    All other components within normal limits  CBC - Abnormal; Notable for the following:    WBC 18.8 (*)    All other components within normal limits  URINALYSIS, ROUTINE W REFLEX MICROSCOPIC - Abnormal; Notable for the following:    Color, Urine AMBER (*)    Ketones, ur 5 (*)    All other components within normal limits  I-STAT TROPOININ, ED   EKG  EKG Interpretation  Date/Time:  Saturday December 27 2016 01:31:30 EST Ventricular Rate:  65 PR Interval:    QRS Duration: 98 QT Interval:  393 QTC Calculation: 409 R Axis:   64 Text Interpretation:  Sinus rhythm ST elev, probable normal early repol pattern new flipped t wave in aVL not seen in prior Otherwise no significant change Confirmed by Jamaal Bernasconi MD, DANIEL [phone]([id number]) on 12/27/2016 1:34:45 AM      Radiology Dg Chest 2 View  Result Date: 12/27/2016 CLINICAL DATA:  Mid chest pain, epigastric pain. EXAM: CHEST  2 VIEW COMPARISON:  Radiographs 01/17/2015 FINDINGS: Patient is post median sternotomy. The heart is normal in size. Mediastinal contours are normal. No pulmonary edema, pleural fluid or pneumothorax. No focal airspace disease. Minimal scarring at the left lung base. Gastric band in the upper abdomen. IMPRESSION: No acute abnormality. Electronically Signed   By: Rubye OaksMelanie  Ehinger M.D.   On: 12/27/2016 02:44   [place] Abdomen Limited Ruq  Result Date: 12/27/2016 CLINICAL DATA:  Pancreatitis.  Abdominal pain for 1 day. EXAM: US ABDOMEN LIMITED - RIGHT UPPER QUADRANT COMPARISON:  None. FINDINGS: Gallbladder: Gallbladder is physiologically distended. Stones in intraluminal sludge within the gallbladder, including stones in the gallbladder neck. No gallbladder wall thickening. No pericholecystic fluid. No sonographic Murphy sign noted by sonographer. Common bile duct: Diameter: 7 mm. Questionable 5 mm stone  in the distal common bile duct. Liver: Increased and heterogeneous in echogenicity. Liver parenchyma is difficult to penetrate no evidence of focal lesion. Limited left lobe visualization. Normal directional flow in the main portal vein. IMPRESSION: 1. Sludge and stones in the gallbladder without sonographic findings of acute cholecystitis. 2. Prominent common bile duct for age measuring 7 mm, with a questionable 5 mm stone distally. 3. Hepatic steatosis. Electronically Signed   By: Rubye OaksMelanie  Ehinger M.D.   On: 12/27/2016 04:40    Procedures Procedures   Medications Ordered in ED Medications  morphine 4 MG/ML injection 4 mg (4 mg Intravenous Given 12/27/16 0637)  ondansetron (ZOFRAN) injection 4 mg (not administered)  0.45 %  sodium chloride infusion ( Intravenous New Bag/Given 12/27/16 0639)  gi cocktail (Maalox,Lidocaine,Donnatal) (30 mLs Oral Given 12/27/16 0140)  aspirin chewable tablet 324 mg (324 mg Oral Given 12/27/16 0140)  morphine 4 MG/ML injection 4 mg (4 mg Intravenous Given 12/27/16 0300)  ondansetron (ZOFRAN) injection 4 mg (4 mg Intravenous Given 12/27/16 0300)    Initial Impression / Assessment and Plan / ED Course  I have reviewed the triage vital signs and the nursing notes.  Pertinent labs & imaging results that were available during my care of the patient were reviewed by me and considered in my medical decision making (see chart for details).     46 yo M with a chief complaint of epigastric pain that radiates to his back. Going on for the past day or so. Having some nausea denies vomiting. On my exam patient with tenderness worse to the epigastrium. Labs concerning for pancreatitis. Patient does have some transaminase and total bilirubin elevation. Right upper quadrant ultrasound was concerning for possible common bile duct stone.  Will discuss with medicine for admission.   Discussed with [facility], will come and evaluate the patient.   The patients results and plan were reviewed  and discussed.   Any x-rays performed were independently reviewed by myself.   Differential diagnosis were considered with the presenting HPI.  Medications  morphine 4 MG/ML injection 4 mg (4 mg Intravenous Given 12/27/16 0637)  ondansetron (ZOFRAN) injection 4 mg (not administered)  0.45 % sodium chloride infusion ( Intravenous New Bag/Given 12/27/16 0639)  gi cocktail (Maalox,Lidocaine,Donnatal) (30 mLs Oral Given 12/27/16 0140)  aspirin chewable tablet 324 mg (324 mg Oral Given 12/27/16 0140)  morphine 4 MG/ML injection 4 mg (4 mg Intravenous Given 12/27/16 0300)  ondansetron (ZOFRAN) injection 4 mg (4 mg Intravenous Given 12/27/16 0300)    Vitals:   12/27/16 0437 12/27/16 0500 12/27/16 0530 12/27/16 0629  BP: 114/74 119/71 124/74 116/61  Pulse: (!) 53 (!) 53 (!) 49 (!) 51  Resp: 18 15 16 16   Temp:    97.8 F (36.6 C)  TempSrc:    Oral  SpO2: 99% 97% 98% 100%  Weight:    237 lb (107.5 kg)  Height:    5\' 9"  (1.753 m)    Final diagnoses:  Pancreatitis  Choledocholithiasis    Admission/ observation were discussed with the admitting physician, patient and/or family and they are comfortable with the plan.      Final Clinical Impressions(s) / ED Diagnoses   Final diagnoses:  Pancreatitis  Choledocholithiasis   New Prescriptions Current Discharge Medication List     I personally performed the services described in this documentation, which was scribed in my presence. The recorded information has been reviewed and is accurate.     Melene Plan, DO 12/27/16 [phone]

## 2016-12-27 NOTE — ED Triage Notes (Signed)
C/o epigastric pain and generalized abd pain since midnight last night (24 hours).  Pain now radiates to back.  Denies nausea, vomiting, diarrhea, constipation, or urinary complaints.  Pt has lap band.  Took NTG at 11:50pm without any relief.

## 2016-12-27 NOTE — Consult Note (Signed)
Referring Provider: [facility]  Primary Care Physician:  Ernst BreachYSINGER, DAVID SHANE, PA-C Primary Gastroenterologist:  unassigned  Reason for Consultation:  pancreatitis   Attending physician's note   I have taken a history, examined the patient and reviewed the chart. I agree with the Advanced Practitioner's note, impression and recommendations. Acute biliary pancreatitis with US showing cholelithiasis and probably choledocholithiasis. Leukocytosis likely due to inflammation without infection so can discontinue antibiotics. Maintain vigorous IV hydration and trend CMP, CBC, lipase. ERCP for stone extraction after pancreatitis has substantially improved/resolved. Will eventually need cholecystectomy so would consult gen surgery.   [name], MD Clementeen GrahamFACG [phone] Mon-Fri 8a-5p [phone] after 5p, weekends, holidays   ASSESSMENT AND PLAN:   1. 46 yo male with biliary pancreatitis. U/S shows cholelithiasis / suspected choledocholithiasis ( small distal CBD stone). Patient still uncomfortable but non-toxic appearing. He is hemodynamically stable, HCT 41, normal renal function. Afebrile but WBC 18K which may be just inflammatory reaction.  -Increase IVF to 150 ml / hr -pain control -Will need eventual ERCP with stone extraction -No clear indication for antibiotics right now  2. Steatosis by ultrasound  3. Obesity. He has a lap band in place but apparently port only partially filled.   4. CAD, s/p CABG 2 years ago. On ASA and statin   HPI: Hunter Gomez is a 46 y.o. male with a history of CAD /CABG. He developed severe, sharp epigastric pain night before last. Occasionally pain radiating to the back. Never had this type of pain before. He got concern that it was a heart attack and took a NTG without relief.He had no chest pain or SOB.  No associated nausea or fevers. No bowel changes.  Found in ED to have lipase in 700s.  Patient doesn't drink ETOH. Today he feels uncomfortable and  bloated.    Past Medical History:  Diagnosis Date  . CAD (coronary artery disease)    a.  NSTEMI (1/16):  LHC - dLM 95, oLAD 90, mLAD 30, oCFX 80-90, pCFX 50, oPDA 80, oPLA 75, EF 55-65% >> urgent CABG  . Hepatic steatosis   . HLD (hyperlipidemia)   . Hx of echocardiogram    Echo (2/16):  Mild LVH, EF 55-60%, Gr 2 DD, trivial AI/TR, no effusion  . Hypertension 10/2012  . Muscle weakness 1997   prior seizure like activity, but none since; prior neurology eval, prior normal EEG 1997  . Obesity    s/p Lap Band surgery  . Vision problem    prior use of glasses, not currently    Past Surgical History:  Procedure Laterality Date  . CORONARY ARTERY BYPASS GRAFT N/A 12/18/2014   Procedure: CORONARY ARTERY BYPASS GRAFTING (CABG), ON PUMP, TIMES FIVE, USING BILATERAL INTERNAL MAMMARY ARTERIES, RIGHT GREATER SAPHENOUS VEIN HARVESTED ENDOSCOPICALLY;  Surgeon: Kerin PernaPeter Van Trigt, MD;  [facility];  Service: Open Heart Surgery;  Laterality: N/A;  LIMA-LAD, SVG-D, SVG-OM, Free RIMA-Ramus, SVG-PD  . INTRAOPERATIVE TRANSESOPHAGEAL ECHOCARDIOGRAM N/A 12/18/2014   Procedure: INTRAOPERATIVE TRANSESOPHAGEAL ECHOCARDIOGRAM;  Surgeon: Kerin PernaPeter Van Trigt, MD;  [facility];  Service: Open Heart Surgery;  Laterality: N/A;  . LAPAROSCOPIC GASTRIC BANDING  2007   initial procedure Dr. [place]b [place]rtiz [place], [place]  . LAPAROSCOPIC GASTRIC BANDING  2006  . LEFT HEART CATHETERIZATION WITH CORONARY ANGIOGRAM N/A 12/18/2014   Procedure: LEFT HEART CATHETERIZATION WITH CORONARY ANGIOGRAM;  Surgeon: Micheline ChapmanMichael D Cooper, MD;  [facility];  Service: Cardiovascular;  Laterality: N/A;  . TONSILLECTOMY    .  WISDOM TOOTH EXTRACTION      Prior to Admission medications   Medication Sig Start Date End Date Taking? Authorizing Provider  aspirin 81 MG EC tablet Take 1 tablet (81 mg total) by mouth daily. 09/19/15  Yes Beatrice Lecher, PA-C  atorvastatin (LIPITOR) 80 MG tablet TAKE 1 TABLET(80  MG) BY MOUTH DAILY 10/14/16  Yes Beatrice Lecher, PA-C  lisinopril (PRINIVIL,ZESTRIL) 10 MG tablet TAKE 1 TABLET(10 MG) BY MOUTH DAILY 09/22/16  Yes Beatrice Lecher, PA-C  metoprolol (LOPRESSOR) 50 MG tablet TAKE 1 TABLET(50 MG) BY MOUTH TWICE DAILY 09/22/16  Yes Beatrice Lecher, PA-C  nitroGLYCERIN (NITROSTAT) 0.4 MG SL tablet Place 1 tablet (0.4 mg total) under the tongue every 5 (five) minutes as needed for chest pain. 09/19/15  Yes Beatrice Lecher, PA-C    Current Facility-Administered Medications  Medication Dose Route Frequency Provider Last Rate Last Dose  . 0.45 % sodium chloride infusion   Intravenous Continuous Clydie Braun, MD 125 mL/hr at 12/27/16 [id number]    . acetaminophen (TYLENOL) tablet 650 mg  650 mg Oral Q6H PRN Gwenyth Bender, NP       Or  . acetaminophen (TYLENOL) suppository 650 mg  650 mg Rectal Q6H PRN Gwenyth Bender, NP      . morphine 2 MG/ML injection 2 mg  2 mg Intravenous Q3H PRN Gwenyth Bender, NP      . ondansetron [facility]) injection 4 mg  4 mg Intravenous Q6H PRN Melene Plan, DO      . ondansetron [facility]) tablet 4 mg  4 mg Oral Q6H PRN Gwenyth Bender, NP       Or  . ondansetron [facility]) injection 4 mg  4 mg Intravenous Q6H PRN Gwenyth Bender, NP      . piperacillin-tazobactam (ZOSYN) IVPB 3.375 g  3.375 g Intravenous Q8H Para March Batchelder, RPH   3.375 g at 12/27/16 9604  . sodium chloride flush (NS) 0.9 % injection 3 mL  3 mL Intravenous Q12H Gwenyth Bender, NP        Allergies as of 12/27/2016  . (No Known Allergies)    Family History  Problem Relation Age of Onset  . Diabetes Mother   . Hypertension Mother   . Hyperlipidemia Mother   . Heart disease Mother 55    CABG  . Peripheral vascular disease Mother   . Hypertension Father   . Hyperlipidemia Father   . Heart disease Father 17    CABG  . Stroke Father   . Hypertension Brother   . Hyperlipidemia Brother   . Cancer Maternal Grandfather     stomach  . Cancer Paternal Grandmother   . Heart attack Neg  Hx     Social History   Social History  . Marital status: Married    Spouse name: N/A  . Number of children: N/A  . Years of education: N/A   Occupational History  . Not on file.   Social History Main Topics  . Smoking status: Never Smoker  . Smokeless tobacco: Never Used  . Alcohol use No  . Drug use: No  . Sexual activity: Not on file   Other Topics Concern  . 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  Physical Exam: Vital signs  in last 24 hours: Temp:  [97.8 F (36.6 C)-98.2 F (36.8 C)] 97.8 F (36.6 C) (02/03 0629) Pulse Rate:  [49-73] 51 (02/03 0629) Resp:  [14-18] 16 (02/03 0629) BP: (114-128)/(61-83) 116/61 (02/03 0629) SpO2:  [97 %-100 %] 100 % (02/03 0629) Weight:  [237 lb (107.5 kg)] 237 lb (107.5 kg) (02/03 0629) Last BM Date: 12/26/16 General:   Alert,  Obese white male in NAD Eyes:  Sclera clear, no icterus.   Conjunctiva pink. Ears:  Normal auditory acuity. Mouth:  No deformity or lesions.   Neck:  Supple; no masses or thyromegaly. Lungs:  Bibasilar crackles.  Heart:  Regular rate and rhythm; no murmurs, clicks, rubs,  or gallops. Abdomen:  Soft, mild to mod epigastric tenderness. BS active, no palp mass or hsm.   Rectal:  Deferred  Msk:  Symmetrical without gross deformities. . Pulses:  Normal pulses noted. Extremities:  Without clubbing or edema. Neurologic:  Alert and  oriented x4;  grossly normal neurologically. Skin:  Intact without significant lesions or rashes.. Psych:  Alert and cooperative. Normal mood and affect.  Intake/Output from previous day: No intake/output data recorded. Intake/Output this shift: Total I/O In: 50 [IV Piggyback:50] Out: -   Lab Results:  Recent Labs  12/27/16 0048  WBC 18.8*  HGB 13.9  HCT 41.1  PLT 262    BMET  Recent Labs  12/27/16 0048  NA 138  K 3.8  CL 101  CO2 24  GLUCOSE 135*  BUN 15  CREATININE 0.79  CALCIUM 9.2   LFT  Recent Labs  12/27/16 0048  PROT 7.0  ALBUMIN 4.4  AST 67*  ALT 124*  ALKPHOS 171*  BILITOT 1.7*     Studies/Results: Dg Chest 2 View  Result Date: 12/27/2016 CLINICAL DATA:  Mid chest pain, epigastric pain. EXAM: CHEST  2 VIEW COMPARISON:  Radiographs 01/17/2015 FINDINGS: Patient is post median sternotomy. The heart is normal in size. Mediastinal contours are normal. No pulmonary edema, pleural fluid or pneumothorax. No focal airspace disease. Minimal scarring at the left lung base. Gastric band in the upper abdomen. IMPRESSION: No acute abnormality. Electronically Signed   By: Rubye Oaks M.D.   On: 12/27/2016 02:44   US Abdomen Limited Ruq  Result Date: 12/27/2016 CLINICAL DATA:  Pancreatitis.  Abdominal pain for 1 day. EXAM: US ABDOMEN LIMITED - RIGHT UPPER QUADRANT COMPARISON:  None. FINDINGS: Gallbladder: Gallbladder is physiologically distended. Stones in intraluminal sludge within the gallbladder, including stones in the gallbladder neck. No gallbladder wall thickening. No pericholecystic fluid. No sonographic Murphy sign noted by sonographer. Common bile duct: Diameter: 7 mm. Questionable 5 mm stone in the distal common bile duct. Liver: Increased and heterogeneous in echogenicity. Liver parenchyma is difficult to penetrate no evidence of focal lesion. Limited left lobe visualization. Normal directional flow in the main portal vein. IMPRESSION: 1. Sludge and stones in the gallbladder without sonographic findings of acute cholecystitis. 2. Prominent common bile duct for age measuring 7 mm, with a questionable 5 mm stone distally. 3. Hepatic steatosis. Electronically Signed   By: Rubye Oaks M.D.   On: 12/27/2016 04:40    Willette Cluster, NP-C @  12/27/2016, 11:16 AM  Pager number [phone]

## 2016-12-27 NOTE — Progress Notes (Signed)
Pharmacy Antibiotic Note  Hunter Gomez is a 46 y.o. male admitted on 12/27/2016 with intra-abdominal infection.  Pharmacy has been consulted for Zosyn dosing. Also on ciprofloxacin per admitting team.  US reveals enlargement of the common bile duct with biliary sludge and cholelithiasis. Consulting gastroenterology. Afebrile, WBC 18.8. SCr stable, CrCl > 18400ml/min.  Plan: Start Zosyn 3.375 gm IV q8h (4 hour infusion) Increase ciprofloxacin 400mg  IV to Q12 due to renal function per MD Monitor clinical picture, renal function F/U C&S, abx deescalation / LOT  Consider need for ciprofloxacin if on Zosyn as well. Stop ciprofloxacin soon?  Height: 5\' 9"  (175.3 cm) Weight: 237 lb (107.5 kg) IBW/kg (Calculated) : 70.7  Temp (24hrs), Avg:98 F (36.7 C), Min:97.8 F (36.6 C), Max:98.2 F (36.8 C)   Recent Labs Lab 12/27/16 0048  WBC 18.8*  CREATININE 0.79    Estimated Creatinine Clearance: 140.9 mL/min (by C-G formula based on SCr of 0.79 mg/dL).    No Known Allergies  Antimicrobials this admission: Ciprofloxacin 2/3 >>  Zosyn 2/3 >>   Dose adjustments this admission: n/a  Microbiology results: n/a  Thank you for allowing pharmacy to be a part of this patient's care.  Enzo BiNathan Jakita Dutkiewicz, PharmD, BCPS Clinical Pharmacist Pager [phone] 12/27/2016 7:41 AM

## 2016-12-27 NOTE — H&P (Signed)
History and Physical    Hunter Gomez ZOX:[id number] DOB: [birth year] DOA: 12/27/2016  PCP: Ernst Breach, PA-C Patient coming from: home  Chief Complaint: abdominal pain  HPI: Hunter Gomez is a very pleasant 46 y.o. male with medical history significant for attention, obesity status post lap band surgery, GERD, CAD status post CABG presents to the emergency Department chief complaint of sudden onset abdominal pain. Initial evaluation includes abdominal ultrasound concerning for cholelithiasis.  Formation is obtained from the patient. He reports being in his usual state of health yesterday when he developed sudden sharp abdominal pain. Describes the pain as sharp constant located in the epigastric and lower quadrants. He took anti-flatus over-the-counter medication with little relief. He also took nitroglycerin prior to coming to the ED with no relief. Associated symptoms include nausea without vomiting. He denies fever chills headache dizziness syncope or near-syncope. He denies chest pain palpitation shortness of breath lower extremity edema or orthopnea. He denies dysuria hematuria frequency or urgency.    ED Course: The emergency department he's afebrile hemodynamically stable heart rate at the lower end of normal range he is provided with morphine Zofran and vigorous IV fluids.  Review of Systems: As per HPI otherwise 10 point review of systems negative.   Ambulatory Status: He ambulates independently no recent falls  Past Medical History:  Diagnosis Date  . CAD (coronary artery disease)    a.  NSTEMI (1/16):  LHC - dLM 95, oLAD 90, mLAD 30, oCFX 80-90, pCFX 50, oPDA 80, oPLA 75, EF 55-65% >> urgent CABG  . Hepatic steatosis   . HLD (hyperlipidemia)   . Hx of echocardiogram    Echo (2/16):  Mild LVH, EF 55-60%, Gr 2 DD, trivial AI/TR, no effusion  . Hypertension 10/2012  . Muscle weakness 1997   prior seizure like activity, but none since; prior neurology eval, prior normal  EEG 1997  . Obesity    s/p Lap Band surgery  . Vision problem    prior use of glasses, not currently    Past Surgical History:  Procedure Laterality Date  . CORONARY ARTERY BYPASS GRAFT N/A 12/18/2014   Procedure: CORONARY ARTERY BYPASS GRAFTING (CABG), ON PUMP, TIMES FIVE, USING BILATERAL INTERNAL MAMMARY ARTERIES, RIGHT GREATER SAPHENOUS VEIN HARVESTED ENDOSCOPICALLY;  Surgeon: Kerin Perna, MD;  Location: [facility] OR;  Service: Open Heart Surgery;  Laterality: N/A;  LIMA-LAD, SVG-D, SVG-OM, Free RIMA-Ramus, SVG-PD  . INTRAOPERATIVE TRANSESOPHAGEAL ECHOCARDIOGRAM N/A 12/18/2014   Procedure: INTRAOPERATIVE TRANSESOPHAGEAL ECHOCARDIOGRAM;  Surgeon: Kerin Perna, MD;  Location: [facility] OR;  Service: Open Heart Surgery;  Laterality: N/A;  . LAPAROSCOPIC GASTRIC BANDING  2007   initial procedure Dr. Robb Matar in [place], adjustments in [place], [place]  . LAPAROSCOPIC GASTRIC BANDING  2006  . LEFT HEART CATHETERIZATION WITH CORONARY ANGIOGRAM N/A 12/18/2014   Procedure: LEFT HEART CATHETERIZATION WITH CORONARY ANGIOGRAM;  Surgeon: Micheline Chapman, MD;  Location: [facility] CATH LAB;  Service: Cardiovascular;  Laterality: N/A;  . TONSILLECTOMY    . WISDOM TOOTH EXTRACTION      Social History   Social History  . Marital status: Married    Spouse name: N/A  . Number of children: N/A  . Years of education: N/A   Occupational History  . Not on file.   Social History Main Topics  . Smoking status: Never Smoker  . Smokeless tobacco: Never Used  . Alcohol use No  . Drug use: No  . Sexual activity: Not on file   Other Topics Concern  .  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 Diabetes Mother   . Hypertension Mother   . Hyperlipidemia Mother   . Heart disease Mother 73    CABG  . Peripheral vascular disease  Mother   . Hypertension Father   . Hyperlipidemia Father   . Heart disease Father 89    CABG  . Stroke Father   . Hypertension Brother   . Hyperlipidemia Brother   . Cancer Maternal Grandfather     stomach  . Cancer Paternal Grandmother   . Heart attack Neg Hx     Prior to Admission medications   Medication Sig Start Date End Date Taking? Authorizing Provider  aspirin 81 MG EC tablet Take 1 tablet (81 mg total) by mouth daily. 09/19/15  Yes Beatrice Lecher, PA-C  atorvastatin (LIPITOR) 80 MG tablet TAKE 1 TABLET(80 MG) BY MOUTH DAILY 10/14/16  Yes Beatrice Lecher, PA-C  lisinopril (PRINIVIL,ZESTRIL) 10 MG tablet TAKE 1 TABLET(10 MG) BY MOUTH DAILY 09/22/16  Yes Beatrice Lecher, PA-C  metoprolol (LOPRESSOR) 50 MG tablet TAKE 1 TABLET(50 MG) BY MOUTH TWICE DAILY 09/22/16  Yes Beatrice Lecher, PA-C  nitroGLYCERIN (NITROSTAT) 0.4 MG SL tablet Place 1 tablet (0.4 mg total) under the tongue every 5 (five) minutes as needed for chest pain. 09/19/15  Yes Beatrice Lecher, PA-C    Physical Exam: Vitals:   12/27/16 0437 12/27/16 0500 12/27/16 0530 12/27/16 0629  BP: 114/74 119/71 124/74 116/61  Pulse: (!) 53 (!) 53 (!) 49 (!) 51  Resp: 18 15 16 16   Temp:    97.8 F (36.6 C)  TempSrc:    Oral  SpO2: 99% 97% 98% 100%  Weight:    107.5 kg (237 lb)  Height:    5\' 9"  (1.753 m)     General:  Appears calm and comfortable Sitting up in bed smiling Eyes:  PERRL, EOMI, normal lids, iris ENT:  grossly normal hearing, lips & tongue, his membranes of his mouth are pink but slightly dry Neck:  no LAD, masses or thyromegaly Cardiovascular:  RRR, no m/r/g. No LE edema. Pedal pulses present and palpable Respiratory:  CTA bilaterally, no w/r/r. Normal respiratory effort. Abdomen:  soft, nondistended mild to moderate tenderness in lower quadrants and epigastric area to palpation. Very sluggish bowel sounds no guarding or rebounding Skin:  no rash or induration seen on limited exam Musculoskeletal:   grossly normal tone BUE/BLE, good ROM, no bony abnormality Psychiatric:  grossly normal mood and affect, speech fluent and appropriate, AOx3 Neurologic:  CN 2-12 grossly intact, moves all extremities in coordinated fashion, sensation intact  Labs on Admission: I have personally reviewed following labs and imaging studies  CBC:  Recent Labs Lab 12/27/16 0048  WBC 18.8*  HGB 13.9  HCT 41.1  MCV 83.7  PLT 262   Basic Metabolic Panel:  Recent Labs Lab 12/27/16 0048  NA 138  K 3.8  CL 101  CO2 24  GLUCOSE 135*  BUN 15  CREATININE 0.79  CALCIUM 9.2   GFR: Estimated Creatinine Clearance: 140.9 mL/min (by C-G formula based on SCr of 0.79 mg/dL). Liver Function Tests:  Recent Labs Lab 12/27/16 0048  AST 67*  ALT 124*  ALKPHOS 171*  BILITOT 1.7*  PROT 7.0  ALBUMIN 4.4  Recent Labs Lab 12/27/16 0048  LIPASE 789*   No results for input(s): AMMONIA in the last 168 hours. Coagulation Profile: No results for input(s): INR, PROTIME in the last 168 hours. Cardiac Enzymes: No results for input(s): CKTOTAL, CKMB, CKMBINDEX, TROPONINI in the last 168 hours. BNP (last 3 results) No results for input(s): PROBNP in the last 8760 hours. HbA1C: No results for input(s): HGBA1C in the last 72 hours. CBG: No results for input(s): GLUCAP in the last 168 hours. Lipid Profile: No results for input(s): CHOL, HDL, LDLCALC, TRIG, CHOLHDL, LDLDIRECT in the last 72 hours. Thyroid Function Tests: No results for input(s): TSH, T4TOTAL, FREET4, T3FREE, THYROIDAB in the last 72 hours. Anemia Panel: No results for input(s): VITAMINB12, FOLATE, FERRITIN, TIBC, IRON, RETICCTPCT in the last 72 hours. Urine analysis:    Component Value Date/Time   COLORURINE AMBER (A) 12/27/2016 0050   APPEARANCEUR CLEAR 12/27/2016 0050   LABSPEC 1.024 12/27/2016 0050   PHURINE 5.0 12/27/2016 0050   GLUCOSEU NEGATIVE 12/27/2016 0050   HGBUR NEGATIVE 12/27/2016 0050   BILIRUBINUR NEGATIVE 12/27/2016  0050   BILIRUBINUR NEG 10/25/2012 0937   KETONESUR 5 (A) 12/27/2016 0050   PROTEINUR NEGATIVE 12/27/2016 0050   UROBILINOGEN 0.2 12/18/2014 1036   NITRITE NEGATIVE 12/27/2016 0050   LEUKOCYTESUR NEGATIVE 12/27/2016 0050    Creatinine Clearance: Estimated Creatinine Clearance: 140.9 mL/min (by C-G formula based on SCr of 0.79 mg/dL).  Sepsis Labs: @LABRCNTIP (procalcitonin:4,lacticidven:4) )No results found for this or any previous visit (from the past 240 hour(s)).   Radiological Exams on Admission: Dg Chest 2 View  Result Date: 12/27/2016 CLINICAL DATA:  Mid chest pain, epigastric pain. EXAM: CHEST  2 VIEW COMPARISON:  Radiographs 01/17/2015 FINDINGS: Patient is post median sternotomy. The heart is normal in size. Mediastinal contours are normal. No pulmonary edema, pleural fluid or pneumothorax. No focal airspace disease. Minimal scarring at the left lung base. Gastric band in the upper abdomen. IMPRESSION: No acute abnormality. Electronically Signed   By: Rubye Oaks M.D.   On: 12/27/2016 02:44   US Abdomen Limited Ruq  Result Date: 12/27/2016 CLINICAL DATA:  Pancreatitis.  Abdominal pain for 1 day. EXAM: US ABDOMEN LIMITED - RIGHT UPPER QUADRANT COMPARISON:  None. FINDINGS: Gallbladder: Gallbladder is physiologically distended. Stones in intraluminal sludge within the gallbladder, including stones in the gallbladder neck. No gallbladder wall thickening. No pericholecystic fluid. No sonographic Murphy sign noted by sonographer. Common bile duct: Diameter: 7 mm. Questionable 5 mm stone in the distal common bile duct. Liver: Increased and heterogeneous in echogenicity. Liver parenchyma is difficult to penetrate no evidence of focal lesion. Limited left lobe visualization. Normal directional flow in the main portal vein. IMPRESSION: 1. Sludge and stones in the gallbladder without sonographic findings of acute cholecystitis. 2. Prominent common bile duct for age measuring 7 mm, with a  questionable 5 mm stone distally. 3. Hepatic steatosis. Electronically Signed   By: Rubye Oaks M.D.   On: 12/27/2016 04:40    EKG: Independently reviewed. Sinus rhythm ST elev, probable normal early repol pattern new flipped t wave in aVL not seen in prior  Assessment/Plan Principal Problem:   Choledocholithiasis Active Problems:   Essential hypertension, benign   Obesity   CAD (coronary artery disease)   HLD (hyperlipidemia)   Leukocytosis   Cholelithiasis   Hepatic steatosis   Elevated transaminase level   Pancreatitis, acute   1.choledocholithiasis. [place] ultrasound with sludge and stones in the gallbladder out findings of acute cholecystitis. Prominent common bile duct.  Elevated transaminase elevated lipase. Leukocytosis but patient is afebrile and nontoxic appearing -Admit -Nothing by mouth -Vigorous IV fluids -Supportive therapy -Antibiotics -BMET in am -Await GI input  2. Pancreatitis acute. Related to above. Lipase 798. -See #1 -IV fluids -Monitor  #3. Elevated transaminase level/hepatic steatosis. See abdominal ultrasound. Likely related to #1. Total bilirubin 1.7. Denies EtOH use. -See therapy is noted in #1 and #2  #4. Hypertension. Controlled in the emergency department. Home meds include lisinopril and metoprolol. Heart rate low end of normal. -Hold lisinopril and metoprolol for now -Monitor blood pressure closely -When necessary hydralazine  #5. CAD. Status post CABG. No chest pain. EKG without acute changes. -Resume aspirin and statin when nothing by mouth status discontinued  #6. Obesity. Status post lap band surgery. BMI 35.17   DVT prophylaxis: scd  Code Status: full  Family Communication: brother at bedside  Disposition Plan: home  Consults called: [facility] gi per night staff  Admission status: obs    Gwenyth BenderBLACK,KAREN M MD [facility]  If 7PM-7AM, please contact night-coverage [email or web address] Password [email or web address]  12/27/2016, 7:35 AM

## 2016-12-27 NOTE — Progress Notes (Signed)
Discuss patient with Dr. Adela LankFloyd Mr. Hunter Gomez is a 46 year old male with PMH of HTN, CAD s/p 5v CABG, obesity s/p gastric banding; who presents with complaints of epigastric abdominal pain with nausea 1 day. Pain noted to radiate back. WBC 18.8, lipase 789, and mild elevation in LFTs. Ultrasound reveals enlargement of the common bile duct with biliary sludge and cholelithiasis. ED physician to Gastroenterology to see. Patient NPO,  IVF .45% NS at 125 ml/hr, morphine prn pain.

## 2016-12-27 NOTE — ED Notes (Signed)
Pt transported to US

## 2016-12-28 DIAGNOSIS — K760 Fatty (change of) liver, not elsewhere classified: Secondary | ICD-10-CM

## 2016-12-28 LAB — CBC
HEMATOCRIT: 41.1 % (ref 39.0–52.0)
HEMOGLOBIN: 13.6 g/dL (ref 13.0–17.0)
MCH: 28 pg (ref 26.0–34.0)
MCHC: 33.1 g/dL (ref 30.0–36.0)
MCV: 84.6 fL (ref 78.0–100.0)
Platelets: 217 10*3/uL (ref 150–400)
RBC: 4.86 MIL/uL (ref 4.22–5.81)
RDW: 13.4 % (ref 11.5–15.5)
WBC: 13.9 10*3/uL — ABNORMAL HIGH (ref 4.0–10.5)

## 2016-12-28 LAB — COMPREHENSIVE METABOLIC PANEL
ALK PHOS: 149 U/L — AB (ref 38–126)
ALT: 83 U/L — AB (ref 17–63)
AST: 41 U/L (ref 15–41)
Albumin: 3.7 g/dL (ref 3.5–5.0)
Anion gap: 10 (ref 5–15)
BILIRUBIN TOTAL: 5.7 mg/dL — AB (ref 0.3–1.2)
BUN: 11 mg/dL (ref 6–20)
CALCIUM: 8.9 mg/dL (ref 8.9–10.3)
CO2: 26 mmol/L (ref 22–32)
Chloride: 100 mmol/L — ABNORMAL LOW (ref 101–111)
Creatinine, Ser: 0.83 mg/dL (ref 0.61–1.24)
Glucose, Bld: 120 mg/dL — ABNORMAL HIGH (ref 65–99)
Potassium: 3.5 mmol/L (ref 3.5–5.1)
Sodium: 136 mmol/L (ref 135–145)
TOTAL PROTEIN: 7.2 g/dL (ref 6.5–8.1)

## 2016-12-28 LAB — LIPASE, BLOOD: LIPASE: 334 U/L — AB (ref 11–51)

## 2016-12-28 NOTE — Progress Notes (Signed)
PROGRESS NOTE    Hunter CloudBradley Samford  ZOX:[id number] DOB: 1971-02-10 DOA: 12/27/2016 PCP: Ernst BreachYSINGER, DAVID SHANE, PA-C   Brief Narrative:  HPI: Hunter Gomez is a very pleasant 46 y.o. male with medical history significant for attention, obesity status post lap band surgery, GERD, CAD status post CABG presents to the emergency Department chief complaint of sudden onset abdominal pain. He reports being in his usual state of health yesterday when he developed sudden sharp abdominal pain. Describes the pain as sharp constant located in the epigastric and lower quadrants. He took anti-flatus over-the-counter medication with little relief. He also took nitroglycerin prior to coming to the ED with no relief. Associated symptoms include nausea without vomiting. He denies fever chills headache dizziness syncope or near-syncope. He denies chest pain palpitation shortness of breath lower extremity edema or orthopnea. He denies dysuria hematuria frequency or urgency. Initial evaluation includes abdominal ultrasound concerning for choledocholithiasis and Pancreatitis.   Assessment & Plan:   Principal Problem:   Choledocholithiasis Active Problems:   Essential hypertension, benign   Obesity   CAD (coronary artery disease)   HLD (hyperlipidemia)   Leukocytosis   Cholelithiasis   Hepatic steatosis   Elevated transaminase level   Pancreatitis, acute  1. Choledocholithiasis and Cholelithasis.  -Abdominal ultrasound with sludge and stones in the gallbladder out findings of acute cholecystitis. Prominent common bile duct. Elevated transaminase elevated lipase. -Leukocytosis is Improving from 18.8 -> 13.9; patient is afebrile and nontoxic appearing -Nothing by mouth -Vigorous IV fluids with 1/2 NS at 125 mL/hr -Supportive therapy with Pain control of 2 mg IV q3hprn and Zofran for N/V -Antibiotics now D/C'd -BMET in am -GI following and recommend eventual ERCP with Stone Extraction -Will consult General Surgery in  AM for eventual Cholecystectomy because of cholelithaisis   2. Biliary Pancreatitis  -Acute. Related to above. Lipase improved from 798 -> 334 -Repeat Lipase Level in AM -See #1 -IV fluids at 125 mL/hr -Monitor  3. Elevated Transaminase Level/Hepatic Steatosis.  -LFT's Trending down -See abdominal ultrasound. Likely related to #1.  -Total bilirubin 1.7 -> 5.7.  -Denies EtOH use. -Treatment as Above -Outpatient Management for Steatosis   4. Hypertension.  -Home meds include lisinopril and metoprolol. Heart rate low end of normal. -Hold lisinopril and metoprolol for now -Monitor blood pressure closely -When necessary hydralazine  5. CAD. Status post CABG.  -No chest pain. EKG without acute changes. -Resume aspirin and statin when nothing by mouth status discontinued  6. Obesity.  -Status post lap band surgery. BMI 35.17  DVT prophylaxis: SCDs Code Status: FULL CODE Family Communication: No Family present at bedside Disposition Plan: Remain Inpatient for possible ERCP  Consultants:   Gastroenterology  Procedures:   Abdominal Ultrasound   Antimicrobials:  Anti-infectives    Start     Dose/Rate Route Frequency Ordered Stop   12/27/16 0830  piperacillin-tazobactam (ZOSYN) IVPB 3.375 g  Status:  Discontinued     3.375 g 12.5 mL/hr over 240 Minutes Intravenous Every 8 hours 12/27/16 0738 12/28/16 1718   12/27/16 0800  ciprofloxacin (CIPRO) IVPB 400 mg  Status:  Discontinued    Comments:  Cipro 400 mg IV q12hrs for CrCl >30   400 mg 200 mL/hr over 60 Minutes Intravenous Every 12 hours 12/27/16 0732 12/27/16 0813     Subjective: Seen and examined at bedside at bedside and was doing better. No Nausea or Vomiting. States abdominal pain improving but not really hungry. No CP or SOB.   Objective: Vitals:   12/27/16 1500  12/27/16 1947 12/28/16 0449 12/28/16 1500  BP: 139/69 (!) 142/74 138/75 (!) 142/82  Pulse: 65 83 84 84  Resp: 18   16  Temp: 98.2 F (36.8 C)  100.2 F (37.9 C) 98.6 F (37 C) 98.5 F (36.9 C)  TempSrc: Oral Oral Oral   SpO2: 100% 96% 97% 99%  Weight:      Height:        Intake/Output Summary (Last 24 hours) at 12/28/16 1709 Last data filed at 12/28/16 1300  Gross per 24 hour  Intake                0 ml  Output                0 ml  Net                0 ml   Filed Weights   12/27/16 0629  Weight: 107.5 kg (237 lb)   Examination: Physical Exam:  Constitutional: WN/WD obese Caucasian male, NAD and appears calm and comfortable Eyes: Lids and conjunctivae normal, sclerae anicteric  ENMT: External Ears, Nose appear normal. Grossly normal hearing.  Neck: Appears normal, supple, no cervical masses, normal ROM, no appreciable thyromegaly, no JVD Respiratory: Clear to auscultation bilaterally, no wheezing, rales, rhonchi or crackles. Normal respiratory effort and patient is not tachypenic. No accessory muscle use.  Cardiovascular: RRR, no murmurs / rubs / gallops. S1 and S2 auscultated.  Abdomen: Soft, Mildly tender to palpation, non-distended. No masses palpated. No appreciable hepatosplenomegaly. Bowel sounds positive x4.  GU: Deferred. Musculoskeletal: No clubbing / cyanosis of digits/nails. No joint deformity upper and lower extremities. No Contractures. Skin: No rashes, lesions, ulcers on limited skin evaluation. No induration; Warm and dry.  Neurologic: CN 2-12 grossly intact with no focal deficits. Sensation intact in all 4 Extremities. Romberg sign cerebellar reflexes not assessed.  Psychiatric: Normal judgment and insight. Alert and oriented x 3. Normal mood and appropriate affect.   Data Reviewed: I have personally reviewed following labs and imaging studies  CBC:  Recent Labs Lab 12/27/16 0048 12/28/16 0321  WBC 18.8* 13.9*  HGB 13.9 13.6  HCT 41.1 41.1  MCV 83.7 84.6  PLT 262 217   Basic Metabolic Panel:  Recent Labs Lab 12/27/16 0048 12/28/16 0321  NA 138 136  K 3.8 3.5  CL 101 100*  CO2 24 26    GLUCOSE 135* 120*  BUN 15 11  CREATININE 0.79 0.83  CALCIUM 9.2 8.9   GFR: Estimated Creatinine Clearance: 135.8 mL/min (by C-G formula based on SCr of 0.83 mg/dL). Liver Function Tests:  Recent Labs Lab 12/27/16 0048 12/28/16 0321  AST 67* 41  ALT 124* 83*  ALKPHOS 171* 149*  BILITOT 1.7* 5.7*  PROT 7.0 7.2  ALBUMIN 4.4 3.7    Recent Labs Lab 12/27/16 0048 12/28/16 0321  LIPASE 789* 334*   No results for input(s): AMMONIA in the last 168 hours. Coagulation Profile: No results for input(s): INR, PROTIME in the last 168 hours. Cardiac Enzymes: No results for input(s): CKTOTAL, CKMB, CKMBINDEX, TROPONINI in the last 168 hours. BNP (last 3 results) No results for input(s): PROBNP in the last 8760 hours. HbA1C: No results for input(s): HGBA1C in the last 72 hours. CBG: No results for input(s): GLUCAP in the last 168 hours. Lipid Profile:  Recent Labs  12/27/16 0912  CHOL 127  HDL 35*  LDLCALC 61  TRIG 161*  CHOLHDL 3.6   Thyroid Function Tests:  Recent Labs  12/27/16 0912  TSH 2.415   Anemia Panel: No results for input(s): VITAMINB12, FOLATE, FERRITIN, TIBC, IRON, RETICCTPCT in the last 72 hours. Sepsis Labs: No results for input(s): PROCALCITON, LATICACIDVEN in the last 168 hours.  No results found for this or any previous visit (from the past 240 hour(s)).   Radiology Studies: Dg Chest 2 View  Result Date: 12/27/2016 CLINICAL DATA:  Mid chest pain, epigastric pain. EXAM: CHEST  2 VIEW COMPARISON:  Radiographs 01/17/2015 FINDINGS: Patient is post median sternotomy. The heart is normal in size. Mediastinal contours are normal. No pulmonary edema, pleural fluid or pneumothorax. No focal airspace disease. Minimal scarring at the left lung base. Gastric band in the upper abdomen. IMPRESSION: No acute abnormality. Electronically Signed   By: Rubye Oaks M.D.   On: 12/27/2016 02:44   US Abdomen Limited Ruq  Result Date: 12/27/2016 CLINICAL DATA:   Pancreatitis.  Abdominal pain for 1 day. EXAM: US ABDOMEN LIMITED - RIGHT UPPER QUADRANT COMPARISON:  None. FINDINGS: Gallbladder: Gallbladder is physiologically distended. Stones in intraluminal sludge within the gallbladder, including stones in the gallbladder neck. No gallbladder wall thickening. No pericholecystic fluid. No sonographic Murphy sign noted by sonographer. Common bile duct: Diameter: 7 mm. Questionable 5 mm stone in the distal common bile duct. Liver: Increased and heterogeneous in echogenicity. Liver parenchyma is difficult to penetrate no evidence of focal lesion. Limited left lobe visualization. Normal directional flow in the main portal vein. IMPRESSION: 1. Sludge and stones in the gallbladder without sonographic findings of acute cholecystitis. 2. Prominent common bile duct for age measuring 7 mm, with a questionable 5 mm stone distally. 3. Hepatic steatosis. Electronically Signed   By: Rubye Oaks M.D.   On: 12/27/2016 04:40   Scheduled Meds: . piperacillin-tazobactam (ZOSYN)  IV  3.375 g Intravenous Q8H  . sodium chloride flush  3 mL Intravenous Q12H   Continuous Infusions: . sodium chloride 125 mL/hr (12/28/16 0321)    LOS: 1 day   Merlene Laughter, DO [facility] Pager [phone]  If 7PM-7AM, please contact night-coverage [email or web address] Password [email or web address] 12/28/2016, 5:09 PM

## 2016-12-28 NOTE — Progress Notes (Signed)
[facility] Progress Note  Chief Complaint:   pancreatitis  Subjective: feels better today, minimal abdominal discomfort  Objective:  Vital signs in last 24 hours: Temp:  [98.2 F (36.8 C)-100.2 F (37.9 C)] 98.6 F (37 C) (02/04 0449) Pulse Rate:  [65-84] 84 (02/04 0449) Resp:  [18] 18 (02/03 1500) BP: (138-142)/(69-75) 138/75 (02/04 0449) SpO2:  [96 %-100 %] 97 % (02/04 0449) Last BM Date: 12/26/16 General:   Alert, obese white malein NAD Heart:  Regular rate and rhythm, no edema Pulm: Normal respiratory effor Abdomen:  Soft, nondistended, nontender.  A few bowel sounds, Neurologic:  Alert and  oriented x4;  grossly normal neurologically. Psych:  Alert and cooperative. Normal mood and affect.   Intake/Output from previous day: 02/03 0701 - 02/04 0700 In: 1018.8 [I.V.:918.8; IV Piggyback:100] Out: -  Intake/Output this shift: No intake/output data recorded.  Lab Results:  Recent Labs  12/27/16 0048 12/28/16 0321  WBC 18.8* 13.9*  HGB 13.9 13.6  HCT 41.1 41.1  PLT 262 217   BMET  Recent Labs  12/27/16 0048 12/28/16 0321  NA 138 136  K 3.8 3.5  CL 101 100*  CO2 24 26  GLUCOSE 135* 120*  BUN 15 11  CREATININE 0.79 0.83  CALCIUM 9.2 8.9   LFT  Recent Labs  12/28/16 0321  PROT 7.2  ALBUMIN 3.7  AST 41  ALT 83*  ALKPHOS 149*  BILITOT 5.7*    Dg Chest 2 View  Result Date: 12/27/2016 CLINICAL DATA:  Mid chest pain, epigastric pain. EXAM: CHEST  2 VIEW COMPARISON:  Radiographs 01/17/2015 FINDINGS: Patient is post median sternotomy. The heart is normal in size. Mediastinal contours are normal. No pulmonary edema, pleural fluid or pneumothorax. No focal airspace disease. Minimal scarring at the left lung base. Gastric band in the upper abdomen. IMPRESSION: No acute abnormality. Electronically Signed   By: Rubye OaksMelanie  Ehinger M.D.   On: 12/27/2016 02:44   [place] Abdomen Limited Ruq  Result Date: 12/27/2016 CLINICAL DATA:  Pancreatitis.   Abdominal pain for 1 day. EXAM: US ABDOMEN LIMITED - RIGHT UPPER QUADRANT COMPARISON:  None. FINDINGS: Gallbladder: Gallbladder is physiologically distended. Stones in intraluminal sludge within the gallbladder, including stones in the gallbladder neck. No gallbladder wall thickening. No pericholecystic fluid. No sonographic Murphy sign noted by sonographer. Common bile duct: Diameter: 7 mm. Questionable 5 mm stone in the distal common bile duct. Liver: Increased and heterogeneous in echogenicity. Liver parenchyma is difficult to penetrate no evidence of focal lesion. Limited left lobe visualization. Normal directional flow in the main portal vein. IMPRESSION: 1. Sludge and stones in the gallbladder without sonographic findings of acute cholecystitis. 2. Prominent common bile duct for age measuring 7 mm, with a questionable 5 mm stone distally. 3. Hepatic steatosis. Electronically Signed   By: Rubye OaksMelanie  Ehinger M.D.   On: 12/27/2016 04:40    Assessment / Plan: 1. Biliary pancreatitis with u/s showing cholelithiasis and probable choledocholithiasis. He feels better today. Had some mild chest discomfort lying in bed but resolved after sitting in recliner.  Afebrile, WBC down to 13. HCT 41.  Lipase improving 789 >>> 334. He does have progressive cholestasis with tbili up from 1.7 to 5.7 today.  -Will plan for ERCP, probably early next week.  -Continue IVF at 125 ml / hr -antibiotics weren't discontinued. Given cholestasis will continue for now   2. CAD, s/p CABG 2 years ago  3. Obesity / lap band in place  4. Steatosis,  outpatient management  Principal Problem:   Choledocholithiasis Active Problems:   Essential hypertension, benign   Obesity   CAD (coronary artery disease)   HLD (hyperlipidemia)   Leukocytosis   Cholelithiasis   Hepatic steatosis   Elevated transaminase level   Pancreatitis, acute    LOS: 1 day   Willette Cluster NP 12/28/2016, 11:35 AM  Pager number [phone]     Attending physician's note   I have taken an interval history, reviewed the chart and examined the patient. I agree with the Advanced Practitioner's note, impression and recommendations. Since above note earlier today he notes an increase in epigastric pain. Mild epigastric tenderness is noted. Lipase and WBC decreasing. Bilirubin has increased. Do not feel that he has an active infection.  Biliary pancreatitis, improving. Cholelithiasis. Choledocholithiasis. ERCP, possibly Tuesday. Surgical consult on Monday as he will eventually need cholecystectomy.   Claudette Head, MD [facility] [phone] Mon-Fri 8a-5p [phone] after 5p, weekends, holidays

## 2016-12-29 ENCOUNTER — Encounter (HOSPITAL_COMMUNITY)
Admission: EM | Disposition: A | Discharge status: Home / Self Care | Payer: Self-pay | Source: Home / Self Care | Attending: Internal Medicine

## 2016-12-29 ENCOUNTER — Inpatient Hospital Stay (HOSPITAL_COMMUNITY): Payer: BLUE CROSS/BLUE SHIELD | Admitting: Certified Registered Nurse Anesthetist

## 2016-12-29 ENCOUNTER — Encounter (HOSPITAL_COMMUNITY): Payer: Self-pay | Admitting: Certified Registered Nurse Anesthetist

## 2016-12-29 ENCOUNTER — Inpatient Hospital Stay (HOSPITAL_COMMUNITY): Payer: BLUE CROSS/BLUE SHIELD

## 2016-12-29 DIAGNOSIS — I2510 Atherosclerotic heart disease of native coronary artery without angina pectoris: Secondary | ICD-10-CM

## 2016-12-29 DIAGNOSIS — I10 Essential (primary) hypertension: Secondary | ICD-10-CM

## 2016-12-29 DIAGNOSIS — E785 Hyperlipidemia, unspecified: Secondary | ICD-10-CM

## 2016-12-29 DIAGNOSIS — R17 Unspecified jaundice: Secondary | ICD-10-CM

## 2016-12-29 DIAGNOSIS — D72829 Elevated white blood cell count, unspecified: Secondary | ICD-10-CM

## 2016-12-29 DIAGNOSIS — K8070 Calculus of gallbladder and bile duct without cholecystitis without obstruction: Secondary | ICD-10-CM

## 2016-12-29 HISTORY — PX: ENDOSCOPIC RETROGRADE CHOLANGIOPANCREATOGRAPHY (ERCP) WITH PROPOFOL: SHX5810

## 2016-12-29 LAB — LIPASE, BLOOD: LIPASE: 183 U/L — AB (ref 11–51)

## 2016-12-29 LAB — CBC WITH DIFFERENTIAL/PLATELET
Basophils Absolute: 0 10*3/uL (ref 0.0–0.1)
Basophils Relative: 0 %
Eosinophils Absolute: 0.2 10*3/uL (ref 0.0–0.7)
Eosinophils Relative: 2 %
HEMATOCRIT: 38.5 % — AB (ref 39.0–52.0)
HEMOGLOBIN: 13 g/dL (ref 13.0–17.0)
LYMPHS ABS: 2 10*3/uL (ref 0.7–4.0)
Lymphocytes Relative: 17 %
MCH: 28.2 pg (ref 26.0–34.0)
MCHC: 33.8 g/dL (ref 30.0–36.0)
MCV: 83.5 fL (ref 78.0–100.0)
MONO ABS: 1.2 10*3/uL — AB (ref 0.1–1.0)
MONOS PCT: 10 %
NEUTROS PCT: 71 %
Neutro Abs: 8.3 10*3/uL — ABNORMAL HIGH (ref 1.7–7.7)
Platelets: 196 10*3/uL (ref 150–400)
RBC: 4.61 MIL/uL (ref 4.22–5.81)
RDW: 13.5 % (ref 11.5–15.5)
WBC: 11.6 10*3/uL — ABNORMAL HIGH (ref 4.0–10.5)

## 2016-12-29 LAB — COMPREHENSIVE METABOLIC PANEL
ALBUMIN: 3.5 g/dL (ref 3.5–5.0)
ALT: 71 U/L — ABNORMAL HIGH (ref 17–63)
ANION GAP: 12 (ref 5–15)
AST: 44 U/L — ABNORMAL HIGH (ref 15–41)
Alkaline Phosphatase: 142 U/L — ABNORMAL HIGH (ref 38–126)
BUN: 7 mg/dL (ref 6–20)
CO2: 23 mmol/L (ref 22–32)
Calcium: 8.5 mg/dL — ABNORMAL LOW (ref 8.9–10.3)
Chloride: 101 mmol/L (ref 101–111)
Creatinine, Ser: 0.72 mg/dL (ref 0.61–1.24)
GFR calc Af Amer: >60 mL/min (ref >60)
GFR calc non Af Amer: >60 mL/min (ref >60)
GLUCOSE: 92 mg/dL (ref 65–99)
POTASSIUM: 3.8 mmol/L (ref 3.5–5.1)
SODIUM: 136 mmol/L (ref 135–145)
TOTAL PROTEIN: 6.4 g/dL — AB (ref 6.5–8.1)
Total Bilirubin: 5.3 mg/dL — ABNORMAL HIGH (ref 0.3–1.2)

## 2016-12-29 LAB — PHOSPHORUS: Phosphorus: 2.7 mg/dL (ref 2.5–4.6)

## 2016-12-29 LAB — MAGNESIUM: Magnesium: 2.1 mg/dL (ref 1.7–2.4)

## 2016-12-29 SURGERY — ENDOSCOPIC RETROGRADE CHOLANGIOPANCREATOGRAPHY (ERCP) WITH PROPOFOL
Anesthesia: General

## 2016-12-29 MED ORDER — CIPROFLOXACIN IN D5W 400 MG/200ML IV SOLN
INTRAVENOUS | Status: AC
  Filled 2016-12-29: qty 200

## 2016-12-29 MED ORDER — SODIUM CHLORIDE 0.9 % IV SOLN: INTRAVENOUS | Status: DC

## 2016-12-29 MED ORDER — PROPOFOL 10 MG/ML IV BOLUS
INTRAVENOUS | Status: DC | PRN
  Administered 2016-12-29: 200 mg via INTRAVENOUS
  Administered 2016-12-29: 30 mg via INTRAVENOUS

## 2016-12-29 MED ORDER — ONDANSETRON HCL 4 MG/2ML IJ SOLN
INTRAMUSCULAR | Status: DC | PRN
  Administered 2016-12-29: 4 mg via INTRAVENOUS

## 2016-12-29 MED ORDER — ASPIRIN EC 81 MG PO TBEC
81.0000 mg | DELAYED_RELEASE_TABLET | Freq: Every day | ORAL | Status: DC
  Administered 2016-12-29: 81 mg via ORAL
  Filled 2016-12-29 (×2): qty 1

## 2016-12-29 MED ORDER — INDOMETHACIN 50 MG RE SUPP
RECTAL | Status: DC | PRN
  Administered 2016-12-29: 100 mg via RECTAL

## 2016-12-29 MED ORDER — MIDAZOLAM HCL 5 MG/5ML IJ SOLN
INTRAMUSCULAR | Status: DC | PRN
  Administered 2016-12-29: 1 mg via INTRAVENOUS

## 2016-12-29 MED ORDER — LIDOCAINE 2% (20 MG/ML) 5 ML SYRINGE
INTRAMUSCULAR | Status: DC | PRN
  Administered 2016-12-29: 70 mg via INTRAVENOUS

## 2016-12-29 MED ORDER — SUCCINYLCHOLINE CHLORIDE 200 MG/10ML IV SOSY
PREFILLED_SYRINGE | INTRAVENOUS | Status: DC | PRN
  Administered 2016-12-29: 120 mg via INTRAVENOUS

## 2016-12-29 MED ORDER — INDOMETHACIN 50 MG RE SUPP
RECTAL | Status: AC
  Filled 2016-12-29: qty 2

## 2016-12-29 MED ORDER — FENTANYL CITRATE (PF) 100 MCG/2ML IJ SOLN
INTRAMUSCULAR | Status: DC | PRN
  Administered 2016-12-29: 100 ug via INTRAVENOUS

## 2016-12-29 MED ORDER — CIPROFLOXACIN IN D5W 400 MG/200ML IV SOLN
INTRAVENOUS | Status: DC | PRN
  Administered 2016-12-29: 400 mg via INTRAVENOUS

## 2016-12-29 MED ORDER — IOPAMIDOL (ISOVUE-300) INJECTION 61%
INTRAVENOUS | Status: AC
  Filled 2016-12-29: qty 50

## 2016-12-29 MED ORDER — PHENYLEPHRINE 40 MCG/ML (10ML) SYRINGE FOR IV PUSH (FOR BLOOD PRESSURE SUPPORT)
PREFILLED_SYRINGE | INTRAVENOUS | Status: DC | PRN
  Administered 2016-12-29: 80 ug via INTRAVENOUS
  Administered 2016-12-29 (×2): 40 ug via INTRAVENOUS

## 2016-12-29 MED ORDER — INDOMETHACIN 50 MG RE SUPP: 100.0000 mg | Freq: Once | RECTAL | Status: DC

## 2016-12-29 MED ORDER — GLUCAGON HCL RDNA (DIAGNOSTIC) 1 MG IJ SOLR
INTRAMUSCULAR | Status: AC
  Filled 2016-12-29: qty 1

## 2016-12-29 MED ORDER — METOPROLOL TARTRATE 50 MG PO TABS
50.0000 mg | ORAL_TABLET | Freq: Two times a day (BID) | ORAL | Status: DC
  Administered 2016-12-30 – 2017-01-01 (×5): 50 mg via ORAL
  Filled 2016-12-29 (×5): qty 1

## 2016-12-29 MED ORDER — IOPAMIDOL (ISOVUE-300) INJECTION 61%
INTRAVENOUS | Status: DC | PRN
  Administered 2016-12-29: 40 mL

## 2016-12-29 MED ORDER — ATORVASTATIN CALCIUM 80 MG PO TABS
80.0000 mg | ORAL_TABLET | Freq: Every day | ORAL | Status: DC
  Administered 2016-12-29 – 2016-12-30 (×2): 80 mg via ORAL
  Filled 2016-12-29 (×2): qty 1

## 2016-12-29 MED ORDER — LACTATED RINGERS IV SOLN
INTRAVENOUS | Status: DC | PRN
  Administered 2016-12-29: 11:00:00 via INTRAVENOUS

## 2016-12-29 NOTE — Progress Notes (Signed)
[facility]. Patient is alert and oriented X4. He is currently not complaining of any pain. Some mild soreness in his throat. Vitals are stable, HR 91, O2 95% RA, RR 17, BP 151/74.

## 2016-12-29 NOTE — Anesthesia Procedure Notes (Signed)
Procedure Name: Intubation Date/Time: 12/29/2016 11:55 AM Performed by: Rise PatienceBELL, Kiyoshi Schaab T Pre-anesthesia Checklist: Patient identified, Emergency Drugs available, Suction available and Patient being monitored Patient Re-evaluated:Patient Re-evaluated prior to inductionOxygen Delivery Method: Circle System Utilized Preoxygenation: Pre-oxygenation with 100% oxygen Intubation Type: IV induction Ventilation: Mask ventilation without difficulty Laryngoscope Size: Miller and 2 Grade View: Grade II Tube type: Oral Tube size: 7.5 mm Number of attempts: 1 Airway Equipment and Method: Stylet and Oral airway Placement Confirmation: ETT inserted through vocal cords under direct vision,  positive ETCO2 and breath sounds checked- equal and bilateral Secured at: 23 cm Tube secured with: Tape Dental Injury: Teeth and Oropharynx as per pre-operative assessment

## 2016-12-29 NOTE — Op Note (Signed)
[facility] Name: Hunter CloudBradley Fisk Procedure Date : 12/29/2016 MRN: [id number] Attending MD: Rachael Feeaniel P Jacobs , MD Date of Birth: 18-Jan-1971 CSN: [id number] Age: 46 Admit Type: Outpatient Procedure:                ERCP Indications:              Acute biliary pancreatitis (stones in GB and CBD by                            US), rising Tbili Providers:                Rachael Feeaniel P. Jacobs, MD, Priscella MannAutumn Goldsmith, RN, Beryle BeamsJanie                            Billups, Technician, Rise PatienceSarah Bell, CRNA Referring MD:              Medicines:                General Anesthesia, Cipro 400 mg IV, Indomethacin                            100 mg PR Complications:            No immediate complications. Estimated blood loss:                            None Estimated Blood Loss:     Estimated blood loss: none. Procedure:                Pre-Anesthesia Assessment:                           - Prior to the procedure, a History and Physical                            was performed, and patient medications and                            allergies were reviewed. The patient's tolerance of                            previous anesthesia was also reviewed. The risks                            and benefits of the procedure and the sedation                            options and risks were discussed with the patient.                            All questions were answered, and informed consent                            was obtained. Prior Anticoagulants: The patient has  taken no previous anticoagulant or antiplatelet                            agents. ASA Grade Assessment: II - A patient with                            mild systemic disease. After reviewing the risks                            and benefits, the patient was deemed in                            satisfactory condition to undergo the procedure.                           After obtaining informed consent, the scope was                             passed under direct vision. Throughout the                            procedure, the patient's blood pressure, pulse, and                            oxygen saturations were monitored continuously. The                            ZO-[id number] ([id number]) scope was introduced through                            the mouth, and used to inject contrast into and                            used to inject contrast into the bile duct. The                            ERCP was somewhat difficult due to challenging                            cannulation because of abnormal anatomy. The                            patient tolerated the procedure well. Scope In: Scope Out: Findings:      The scout film was normal. Scope position was difficult, I suspect in       part due to his previous gastric band surgery. This led to a loop in the       stomach which I was not able to reduce. This somewhat limitedthe exam       technically. A 44 autotome over a 0.035 HIDA wire was used to cannulate       the main bile duct. An identical wire was temporarily placed in the main       pancreatic duct to facilitate biliary cannulation. Contrast was injected  and this revealed a mildly dilated main bile duct to about 10 mm. The       cystic duct partially opacified. There were no strictures noted. There       was a single small floating filling defect that looked like a stone. An       adequate biliary sphincterotomy was performed over the wire and the bile       duct. The bile duct was then swept several times with a retrieval       balloon delivering a single small white stone, multiple particles of       sludge and stone debris, dark bile. There was no purulence. The main       pancreatic duct was never injected with dye. Impression:               - Choledocholithiasis was found, treated with                            biliary sphincterotomy and balloon sweeping. Moderate Sedation:       none Recommendation:           - Return patient to hospital ward for ongoing care.                           - Advance diet as tolerated.                           - Continue present medications.                           - General surgery consultation to consider                            cholecystectomy. Procedure Code(s):        --- Professional ---                           [phone], Endoscopic retrograde                            cholangiopancreatography (ERCP); with removal of                            calculi/debris from biliary/pancreatic duct(s)                           43262, Endoscopic retrograde                            cholangiopancreatography (ERCP); with                            sphincterotomy/papillotomy Diagnosis Code(s):        --- Professional ---                           K80.50, Calculus of bile duct without cholangitis                            or cholecystitis without  obstruction                           K85.90, Acute pancreatitis without necrosis or                            infection, unspecified CPT copyright 2016 [employer]. All rights reserved. The codes documented in this report are preliminary and upon coder review may  be revised to meet current compliance requirements. Rachael Fee, MD 12/29/2016 1:17:28 PM This report has been signed electronically. Number of Addenda: 0

## 2016-12-29 NOTE — Anesthesia Preprocedure Evaluation (Addendum)
Anesthesia Evaluation  Patient identified by MRN, date of birth, ID band Patient awake    Airway Mallampati: I  TM Distance: >3 FB Neck ROM: Full    Dental  (+) Teeth Intact, Dental Advisory Given   Pulmonary neg pulmonary ROS,    breath sounds clear to auscultation       Cardiovascular hypertension, + CAD and + Past MI   Rhythm:Regular Rate:Normal     Neuro/Psych negative neurological ROS     GI/Hepatic Neg liver ROS, Epigastric pain   Endo/Other  negative endocrine ROS  Renal/GU negative Renal ROS     Musculoskeletal negative musculoskeletal ROS (+)   Abdominal   Peds  Hematology negative hematology ROS (+)   Anesthesia Other Findings   Reproductive/Obstetrics                            Anesthesia Physical Anesthesia Plan  ASA: III  Anesthesia Plan: General   Post-op Pain Management:    Induction: Intravenous  Airway Management Planned: Oral ETT  Additional Equipment:   Intra-op Plan:   Post-operative Plan: Extubation in OR  Informed Consent: I have reviewed the patients History and Physical, chart, labs and discussed the procedure including the risks, benefits and alternatives for the proposed anesthesia with the patient or authorized representative who has indicated his/her understanding and acceptance.   Dental advisory given  Plan Discussed with:   Anesthesia Plan Comments:         Anesthesia Quick Evaluation

## 2016-12-29 NOTE — H&P (View-Only) (Signed)
[facility] Progress Note  Chief Complaint:   pancreatitis  Subjective: feels better today, minimal abdominal discomfort  Objective:  Vital signs in last 24 hours: Temp:  [98.2 F (36.8 C)-100.2 F (37.9 C)] 98.6 F (37 C) (02/04 0449) Pulse Rate:  [65-84] 84 (02/04 0449) Resp:  [18] 18 (02/03 1500) BP: (138-142)/(69-75) 138/75 (02/04 0449) SpO2:  [96 %-100 %] 97 % (02/04 0449) Last BM Date: 12/26/16 General:   Alert, obese white malein NAD Heart:  Regular rate and rhythm, no edema Pulm: Normal respiratory effor Abdomen:  Soft, nondistended, nontender.  A few bowel sounds, Neurologic:  Alert and  oriented x4;  grossly normal neurologically. Psych:  Alert and cooperative. Normal mood and affect.   Intake/Output from previous day: 02/03 0701 - 02/04 0700 In: 1018.8 [I.V.:918.8; IV Piggyback:100] Out: -  Intake/Output this shift: No intake/output data recorded.  Lab Results:  Recent Labs  12/27/16 0048 12/28/16 0321  WBC 18.8* 13.9*  HGB 13.9 13.6  HCT 41.1 41.1  PLT 262 217   BMET  Recent Labs  12/27/16 0048 12/28/16 0321  NA 138 136  K 3.8 3.5  CL 101 100*  CO2 24 26  GLUCOSE 135* 120*  BUN 15 11  CREATININE 0.79 0.83  CALCIUM 9.2 8.9   LFT  Recent Labs  12/28/16 0321  PROT 7.2  ALBUMIN 3.7  AST 41  ALT 83*  ALKPHOS 149*  BILITOT 5.7*    Dg Chest 2 View  Result Date: 12/27/2016 CLINICAL DATA:  Mid chest pain, epigastric pain. EXAM: CHEST  2 VIEW COMPARISON:  Radiographs 01/17/2015 FINDINGS: Patient is post median sternotomy. The heart is normal in size. Mediastinal contours are normal. No pulmonary edema, pleural fluid or pneumothorax. No focal airspace disease. Minimal scarring at the left lung base. Gastric band in the upper abdomen. IMPRESSION: No acute abnormality. Electronically Signed   By: Rubye OaksMelanie  Ehinger M.D.   On: 12/27/2016 02:44   [place] Abdomen Limited Ruq  Result Date: 12/27/2016 CLINICAL DATA:  Pancreatitis.   Abdominal pain for 1 day. EXAM: US ABDOMEN LIMITED - RIGHT UPPER QUADRANT COMPARISON:  None. FINDINGS: Gallbladder: Gallbladder is physiologically distended. Stones in intraluminal sludge within the gallbladder, including stones in the gallbladder neck. No gallbladder wall thickening. No pericholecystic fluid. No sonographic Murphy sign noted by sonographer. Common bile duct: Diameter: 7 mm. Questionable 5 mm stone in the distal common bile duct. Liver: Increased and heterogeneous in echogenicity. Liver parenchyma is difficult to penetrate no evidence of focal lesion. Limited left lobe visualization. Normal directional flow in the main portal vein. IMPRESSION: 1. Sludge and stones in the gallbladder without sonographic findings of acute cholecystitis. 2. Prominent common bile duct for age measuring 7 mm, with a questionable 5 mm stone distally. 3. Hepatic steatosis. Electronically Signed   By: Rubye OaksMelanie  Ehinger M.D.   On: 12/27/2016 04:40    Assessment / Plan: 1. Biliary pancreatitis with u/s showing cholelithiasis and probable choledocholithiasis. He feels better today. Had some mild chest discomfort lying in bed but resolved after sitting in recliner.  Afebrile, WBC down to 13. HCT 41.  Lipase improving 789 >>> 334. He does have progressive cholestasis with tbili up from 1.7 to 5.7 today.  -Will plan for ERCP, probably early next week.  -Continue IVF at 125 ml / hr -antibiotics weren't discontinued. Given cholestasis will continue for now   2. CAD, s/p CABG 2 years ago  3. Obesity / lap band in place  4. Steatosis,  outpatient management  Principal Problem:   Choledocholithiasis Active Problems:   Essential hypertension, benign   Obesity   CAD (coronary artery disease)   HLD (hyperlipidemia)   Leukocytosis   Cholelithiasis   Hepatic steatosis   Elevated transaminase level   Pancreatitis, acute    LOS: 1 day   Willette Cluster NP 12/28/2016, 11:35 AM  Pager number [phone]     Attending physician's note   I have taken an interval history, reviewed the chart and examined the patient. I agree with the Advanced Practitioner's note, impression and recommendations. Since above note earlier today he notes an increase in epigastric pain. Mild epigastric tenderness is noted. Lipase and WBC decreasing. Bilirubin has increased. Do not feel that he has an active infection.  Biliary pancreatitis, improving. Cholelithiasis. Choledocholithiasis. ERCP, possibly Tuesday. Surgical consult on Monday as he will eventually need cholecystectomy.   Claudette Head, MD [facility] [phone] Mon-Fri 8a-5p [phone] after 5p, weekends, holidays

## 2016-12-29 NOTE — Progress Notes (Signed)
PROGRESS NOTE    Hunter Gomez  NWG:[id number] DOB: 15-Nov-1971 DOA: 12/27/2016 PCP: Ernst Breach, PA-C   Brief Narrative:  HPI: Hunter Gomez is a very pleasant 46 y.o. male with medical history significant for attention, obesity status post lap band surgery, GERD, CAD status post CABG presents to the emergency Department chief complaint of sudden onset abdominal pain. He reports being in his usual state of health yesterday when he developed sudden sharp abdominal pain. Describes the pain as sharp constant located in the epigastric and lower quadrants. He took anti-flatus over-the-counter medication with little relief. He also took nitroglycerin prior to coming to the ED with no relief. Associated symptoms include nausea without vomiting. He denies fever chills headache dizziness syncope or near-syncope. He denies chest pain palpitation shortness of breath lower extremity edema or orthopnea. He denies dysuria hematuria frequency or urgency. Initial evaluation includes abdominal ultrasound concerning for choledocholithiasis and Pancreatitis.   Assessment & Plan:   Principal Problem:   Common bile duct stone Active Problems:   Essential hypertension, benign   Obesity   CAD (coronary artery disease)   HLD (hyperlipidemia)   Leukocytosis   Cholelithiasis   Hepatic steatosis   Elevated transaminase level   Pancreatitis, acute  1. Choledocholithiasis s/p ERCP with Biliary Sphincterotomy and Balloon Sweeping -Abdominal ultrasound with sludge and stones in the gallbladder out findings of acute cholecystitis. Prominent common bile duct. Elevated transaminase elevated lipase. -Leukocytosis is Improving from 18.8 -> 13.9 -> 11.6; patient is afebrile and nontoxic appearing -Nothing by mouth for procedure; Advance Diet as Tolerated per GI - Now on Clear Liqiuid Diet -Vigorous IV fluids with 1/2 NS at 125 mL/hr -Supportive therapy with Pain control with Morphine 2 mg IV q3hprn and Zofran for  N/V -Antibiotics now D/C'd -BMET in am -GI following and recommend eventual ERCP with Stone Extraction -General Surgery in AM for eventual Cholecystectomy because of cholelithaisis   2. Cholelithiasis -Now that ERCP is done consult General Surgery for Cholecystectomy -Consulted Cardiology for Pre-operative Clearance given Hx of CAD and CABG  3. Biliary Pancreatitis  -Acute. Related to above. Lipase improved from 798 -> 334 -> 183 -Repeat Lipase Level in AM -See #1 -IV fluids at 1/2 NS 125 mL/hr -Monitor  4. Elevated Transaminase Level/Hepatic Steatosis and Obstructive Jaundice.  -LFT's Trending down and now AST is 44 and ALT is 71 -See abdominal ultrasound. Likely related to #1.  -Total bilirubin 1.7 -> 5.7 -> 5.3.  -Denies EtOH use. -Treatment as Above -Outpatient Management for Steatosis   5. Hypertension.  -Home meds include lisinopril and metoprolol. Heart rate low end of normal. -Restarted Metoprolol 50 mg po BID; Continue to Hold Lisinopril for now -Monitor blood pressure closely -When necessary hydralazine  6. CAD Status post CABG.  -No chest pain. EKG without acute changes. -Resumed Aspirin and Statin now that patient is tolerating Clears -Cardiology Consulted for Preoperative Clearance -Will need to Restart Lisinopril and Metoprolol  7. Obesity.  -Status post lap band surgery. BMI 35.17  DVT prophylaxis: SCDs Code Status: FULL CODE Family Communication: No Family present at bedside Disposition Plan: Remain Inpatient for possible ERCP  Consultants:   Gastroenterology  General Surgery  Cardiology  Procedures:   Abdominal Ultrasound   Antimicrobials:  Anti-infectives    Start     Dose/Rate Route Frequency Ordered Stop   12/27/16 0830  piperacillin-tazobactam (ZOSYN) IVPB 3.375 g  Status:  Discontinued     3.375 g 12.5 mL/hr over 240 Minutes Intravenous Every 8 hours  12/27/16 0738 12/28/16 1718   12/27/16 0800  ciprofloxacin (CIPRO) IVPB 400  mg  Status:  Discontinued    Comments:  Cipro 400 mg IV q12hrs for CrCl >30   400 mg 200 mL/hr over 60 Minutes Intravenous Every 12 hours 12/27/16 0732 12/27/16 0813     Subjective: Seen and examined at bedside after ERCP and had no complaints. Denied any abdominal pain, Nausea, or Vomiting. No CP or SOB.    Objective: Vitals:   12/29/16 1330 12/29/16 1340 12/29/16 1350 12/29/16 1441  BP: 138/67 (!) 151/74 139/69 140/76  Pulse: (!) 101 97 93 81  Resp: 14 16 18 18   Temp:    98.1 F (36.7 C)  TempSrc:      SpO2: 95% 95% 94% 97%  Weight:      Height:        Intake/Output Summary (Last 24 hours) at 12/29/16 1602 Last data filed at 12/29/16 1441  Gross per 24 hour  Intake              943 ml  Output               20 ml  Net              923 ml   Filed Weights   12/27/16 0629  Weight: 107.5 kg (237 lb)   Examination: Physical Exam:  Constitutional: WN/WD obese Caucasian male, NAD and appears calm and comfortable; Yellowish Hue on patient from Jaundice  Eyes: Lids and conjunctivae normal, sclerae are icteric;  ENMT: External Ears, Nose appear normal. Grossly normal hearing.  Neck: Appears normal, supple, no cervical masses, normal ROM, no appreciable thyromegaly, no JVD Respiratory: Clear to auscultation bilaterally, no wheezing, rales, rhonchi or crackles. Normal respiratory effort and patient is not tachypenic. No accessory muscle use.  Cardiovascular: RRR, no murmurs / rubs / gallops. S1 and S2 auscultated.  Abdomen: Soft, Non-tender to palpation, non-distended. No masses palpated. No appreciable hepatosplenomegaly. Bowel sounds positive x4.  GU: Deferred. Musculoskeletal: No clubbing / cyanosis of digits/nails. No joint deformity upper and lower extremities. No Contractures. Skin: Yellow coloring of skin. No rashes, lesions or bruising on limited skin eval. Warm and dry. Neurologic: CN 2-12 grossly intact with no focal deficits. Sensation intact in all 4 Extremities.  Romberg sign cerebellar reflexes not assessed.  Psychiatric: Normal judgment and insight. Alert and oriented x 3. Normal mood and appropriate affect.   Data Reviewed: I have personally reviewed following labs and imaging studies  CBC:  Recent Labs Lab 12/27/16 0048 12/28/16 0321 12/29/16 0438  WBC 18.8* 13.9* 11.6*  NEUTROABS  --   --  8.3*  HGB 13.9 13.6 13.0  HCT 41.1 41.1 38.5*  MCV 83.7 84.6 83.5  PLT 262 217 196   Basic Metabolic Panel:  Recent Labs Lab 12/27/16 0048 12/28/16 0321 12/29/16 0438  NA 138 136 136  K 3.8 3.5 3.8  CL 101 100* 101  CO2 24 26 23   GLUCOSE 135* 120* 92  BUN 15 11 7   CREATININE 0.79 0.83 0.72  CALCIUM 9.2 8.9 8.5*  MG  --   --  2.1  PHOS  --   --  2.7   GFR: Estimated Creatinine Clearance: 140.9 mL/min (by C-G formula based on SCr of 0.72 mg/dL). Liver Function Tests:  Recent Labs Lab 12/27/16 0048 12/28/16 0321 12/29/16 0438  AST 67* 41 44*  ALT 124* 83* 71*  ALKPHOS 171* 149* 142*  BILITOT 1.7* 5.7* 5.3*  PROT  7.0 7.2 6.4*  ALBUMIN 4.4 3.7 3.5    Recent Labs Lab 12/27/16 0048 12/28/16 0321 12/29/16 0438  LIPASE 789* 334* 183*   No results for input(s): AMMONIA in the last 168 hours. Coagulation Profile: No results for input(s): INR, PROTIME in the last 168 hours. Cardiac Enzymes: No results for input(s): CKTOTAL, CKMB, CKMBINDEX, TROPONINI in the last 168 hours. BNP (last 3 results) No results for input(s): PROBNP in the last 8760 hours. HbA1C: No results for input(s): HGBA1C in the last 72 hours. CBG: No results for input(s): GLUCAP in the last 168 hours. Lipid Profile:  Recent Labs  12/27/16 0912  CHOL 127  HDL 35*  LDLCALC 61  TRIG 102*  CHOLHDL 3.6   Thyroid Function Tests:  Recent Labs  12/27/16 0912  TSH 2.415   Anemia Panel: No results for input(s): VITAMINB12, FOLATE, FERRITIN, TIBC, IRON, RETICCTPCT in the last 72 hours. Sepsis Labs: No results for input(s): PROCALCITON, LATICACIDVEN  in the last 168 hours.  No results found for this or any previous visit (from the past 240 hour(s)).   Radiology Studies: Dg Ercp Biliary & Pancreatic Ducts  Result Date: 12/29/2016 CLINICAL DATA:  Common bile duct stones. EXAM: ERCP TECHNIQUE: Multiple spot images obtained with the fluoroscopic device and submitted for interpretation post-procedure. FLUOROSCOPY TIME:  Fluoroscopy Time:  3 minutes and 54 seconds Number of Acquired Spot Images: 3 COMPARISON:  Ultrasound 12/27/2016 FINDINGS: Wires advanced into the main pancreatic duct and the common bile duct. Contrast opacification of the extrahepatic biliary system. Cystic duct appears to be patent. Mild dilatation of the common bile duct. Question a small stone in the common bile duct. IMPRESSION: Cannulation and opacification of the biliary system for stone removal. These images were submitted for radiologic interpretation only. Please see the procedural report for the amount of contrast and the fluoroscopy time utilized. Electronically Signed   By: Richarda Overlie M.D.   On: 12/29/2016 14:12   Scheduled Meds: . indomethacin  100 mg Rectal Once  . sodium chloride flush  3 mL Intravenous Q12H   Continuous Infusions: . sodium chloride 125 mL/hr (12/28/16 1940)    LOS: 2 days   Merlene Laughter, DO [facility] Pager [phone]  If 7PM-7AM, please contact night-coverage [email or web address] Password [email or web address] 12/29/2016, 4:02 PM

## 2016-12-29 NOTE — Transfer of Care (Signed)
Immediate Anesthesia Transfer of Care Note  Patient: Hunter Gomez  Procedure(s) Performed: Procedure(s): ENDOSCOPIC RETROGRADE CHOLANGIOPANCREATOGRAPHY (ERCP) WITH PROPOFOL (N/A)  Patient Location: Endoscopy Unit  Anesthesia Type:General  Level of Consciousness: awake, alert  and oriented  Airway & Oxygen Therapy: Patient Spontanous Breathing  Post-op Assessment: Report given to RN, Post -op Vital signs reviewed and stable and Patient moving all extremities X 4  Post vital signs: Reviewed and stable  Last Vitals:  Vitals:   12/29/16 0530 12/29/16 1050  BP: 139/67 (!) 159/76  Pulse: 80 83  Resp: 16 19  Temp: 36.8 C 37.1 C    Last Pain:  Vitals:   12/29/16 1050  TempSrc: Oral  PainSc: 3       Patients Stated Pain Goal: 1 (12/29/16 1050)  Complications: No apparent anesthesia complications

## 2016-12-29 NOTE — Consult Note (Signed)
CONSULT NOTE  Date: 12/29/2016               Patient Name:  Hunter CloudBradley Lemaire MRN: [id number]  DOB: Jul 07, 1971 Age / Sex: 46 y.o., male        PCP: Ernst BreachYSINGER, DAVID SHANE Primary Cardiologist:  Excell Seltzerooper             Referring Physician: Marland McalpineSheikh              Reason for Consult: Pre op eval prior to   Lap Chole           History of Present Illness: Patient is a 46 y.o. male with a PMHx of CAD, CABG, obesity, lap band surgery , who was [facility] for evaluation of pre op eval prior to ERCP and lap chole  Pt presented with NSTEMI in Jan. 2016.   Was found to have 3 V cad. Had urgent CABG ( LIMA - LAD, free RIMA - RI, SVG to Diag, SVG to PDA ) . Last seen in our office by Tereso NewcomerScott Weaver, PA in Aug. 2017.   Has been doing well from a cardiac standpoint.  Admitted Feb.3, 2018 with abd. Pain , Dx of cholelithiasis  Had ERCP today .  Possibly going for lap chol tomorrow    He has done very well from a cardiac standpoint. He has not had any episodes of chest pain or chest tightness. He's not had any symptoms similar to his presenting symptoms prior to bypass surgery 2 years ago. He does not exercise on a much but has not had any limitations doing his normal daily activities.   Medications: Outpatient medications: Prescriptions Prior to Admission  Medication Sig Dispense Refill Last Dose  . aspirin 81 MG EC tablet Take 1 tablet (81 mg total) by mouth daily.   12/26/2016 at Unknown time  . atorvastatin (LIPITOR) 80 MG tablet TAKE 1 TABLET(80 MG) BY MOUTH DAILY 30 tablet 8 12/26/2016 at Unknown time  . lisinopril (PRINIVIL,ZESTRIL) 10 MG tablet TAKE 1 TABLET(10 MG) BY MOUTH DAILY 30 tablet 9 12/26/2016 at Unknown time  . metoprolol (LOPRESSOR) 50 MG tablet TAKE 1 TABLET(50 MG) BY MOUTH TWICE DAILY 60 tablet 9 12/26/2016 at 2030  . nitroGLYCERIN (NITROSTAT) 0.4 MG SL tablet Place 1 tablet (0.4 mg total) under the tongue every 5 (five) minutes as needed for chest pain. 25 tablet 11  12/26/2016 at Unknown time    Current medications: Current Facility-Administered Medications  Medication Dose Route Frequency Provider Last Rate Last Dose  . 0.45 % sodium chloride infusion   Intravenous Continuous Clydie Braunondell A Smith, MD 125 mL/hr at 12/28/16 1940 125 mL/hr at 12/28/16 1940  . acetaminophen (TYLENOL) tablet 650 mg  650 mg Oral Q6H PRN Gwenyth BenderKaren M Black, NP       Or  . acetaminophen (TYLENOL) suppository 650 mg  650 mg Rectal Q6H PRN Gwenyth BenderKaren M Black, NP      . aspirin EC tablet 81 mg  81 mg Oral Daily [facility] Sheikh, DO      . atorvastatin (LIPITOR) tablet 80 mg  80 mg Oral q1800 [facility] Latif Sheikh, DO      . indomethacin (INDOCIN) 50 MG suppository 100 mg  100 mg Rectal Once Dianah FieldSarah J Gribbin, PA-C      . metoprolol (LOPRESSOR) tablet 50 mg  50 mg Oral BID [facility] Latif Sheikh, DO      . morphine 2 MG/ML injection 2 mg  2 mg  Intravenous Q3H PRN Gwenyth Bender, NP   2 mg at 12/29/16 1610  . ondansetron (ZOFRAN) injection 4 mg  4 mg Intravenous Q6H PRN Melene Plan, DO      . ondansetron [facility]) tablet 4 mg  4 mg Oral Q6H PRN Gwenyth Bender, NP       Or  . ondansetron [facility]) injection 4 mg  4 mg Intravenous Q6H PRN Lesle Chris Black, NP      . sodium chloride flush (NS) 0.9 % injection 3 mL  3 mL Intravenous Q12H Lesle Chris Black, NP   3 mL at 12/29/16 1000     No Known Allergies   Past Medical History:  Diagnosis Date  . CAD (coronary artery disease)    a.  NSTEMI (1/16):  LHC - dLM 95, oLAD 90, mLAD 30, oCFX 80-90, pCFX 50, oPDA 80, oPLA 75, EF 55-65% >> urgent CABG  . Hepatic steatosis   . HLD (hyperlipidemia)   . Hx of echocardiogram    Echo (2/16):  Mild LVH, EF 55-60%, Gr 2 DD, trivial AI/TR, no effusion  . Hypertension 10/2012  . Muscle weakness 1997   prior seizure like activity, but none since; prior neurology eval, prior normal EEG 1997  . Obesity    s/p Lap Band surgery  . Vision problem    prior use of glasses, not currently    Past Surgical History:  Procedure  Laterality Date  . CORONARY ARTERY BYPASS GRAFT N/A 12/18/2014   Procedure: CORONARY ARTERY BYPASS GRAFTING (CABG), ON PUMP, TIMES FIVE, USING BILATERAL INTERNAL MAMMARY ARTERIES, RIGHT GREATER SAPHENOUS VEIN HARVESTED ENDOSCOPICALLY;  Surgeon: Kerin Perna, MD;  Location: [facility] OR;  Service: Open Heart Surgery;  Laterality: N/A;  LIMA-LAD, SVG-D, SVG-OM, Free RIMA-Ramus, SVG-PD  . INTRAOPERATIVE TRANSESOPHAGEAL ECHOCARDIOGRAM N/A 12/18/2014   Procedure: INTRAOPERATIVE TRANSESOPHAGEAL ECHOCARDIOGRAM;  Surgeon: Kerin Perna, MD;  Location: [facility] OR;  Service: Open Heart Surgery;  Laterality: N/A;  . LAPAROSCOPIC GASTRIC BANDING  2007   initial procedure Dr. Robb Matar in [place], adjustments in [place], [place]  . LAPAROSCOPIC GASTRIC BANDING  2006  . LEFT HEART CATHETERIZATION WITH CORONARY ANGIOGRAM N/A 12/18/2014   Procedure: LEFT HEART CATHETERIZATION WITH CORONARY ANGIOGRAM;  Surgeon: Micheline Chapman, MD;  Location[facility] C[facility] LAB;  Service: Cardiovascular;  Laterality: N/A;  . TONSILLECTOMY    . WISDOM TOOTH EXTRACTION      Family History  Problem Relation Age of Onset  . Diabetes Mother   . Hypertension Mother   . Hyperlipidemia Mother   . Heart disease Mother 59    CABG  . Peripheral vascular disease Mother   . Hypertension Father   . Hyperlipidemia Father   . Heart disease Father 3    CABG  . Stroke Father   . Hypertension Brother   . Hyperlipidemia Brother   . Cancer Maternal Grandfather     stomach  . Cancer Paternal Grandmother   . Heart attack Neg Hx     Social History:  reports that he has never smoked. He has never used smokeless tobacco. He reports that he does not drink alcohol or use drugs.   Review of Systems: Constitutional:  denies fever, chills, diaphoresis, appetite change and fatigue.  HEENT: denies photophobia, eye pain, redness, hearing loss, ear pain, congestion, sore throat, rhinorrhea, sneezing, neck pain, neck stiffness and tinnitus.  Respiratory:  denies SOB, DOE, cough, chest tightness, and wheezing.  Cardiovascular: denies chest pain, palpitations and leg swelling.  Gastrointestinal: admits to  nausea, vomiting, abdominal pain,  Genitourinary: denies dysuria, urgency, frequency, hematuria, flank pain and difficulty urinating.  Musculoskeletal: denies  myalgias, back pain, joint swelling, arthralgias and gait problem.   Skin: denies pallor, rash and wound.  Neurological: denies dizziness, seizures, syncope, weakness, light-headedness, numbness and headaches.   Hematological: denies adenopathy, easy bruising, personal or family bleeding history.  Psychiatric/ Behavioral: denies suicidal ideation, mood changes, confusion, nervousness, sleep disturbance and agitation.    Physical Exam: BP 140/76   Pulse 81   Temp 98.1 F (36.7 C)   Resp 18   Ht 5\' 9"  (1.753 m)   Wt 237 lb (107.5 kg)   SpO2 97%   BMI 35.00 kg/m   Wt Readings from Last 3 Encounters:  12/27/16 237 lb (107.5 kg)  07/01/16 240 lb 12.8 oz (109.2 kg)  09/19/15 233 lb (105.7 kg)    General: Vital signs reviewed and noted. Well-developed, well-nourished, in no acute distress; alert,  Jaundiced   Head: Normocephalic, atraumatic, sclera anicteric,   Neck: Supple. Negative for carotid bruits. No JVD   Lungs:  Clear bilaterally, no  wheezes, rales, or rhonchi. Breathing is normal   Heart: RRR with S1 S2. No murmurs, rubs, or gallops   Abdomen/ GI :  Soft, non-tender, non-distended with normoactive bowel sounds. No hepatomegaly. No rebound/guarding. No obvious abdominal masses   MSK: Strength and the appear normal for age.   Extremities: No clubbing or cyanosis. No edema.  Distal pedal pulses are 2+ and equal   Neurologic:  CN are grossly intact,  No obvious motor or sensory defect.  Alert and oriented X 3. Moves all extremities spontaneously.  Psych: Responds to questions appropriately with a normal affect.     Lab results: Basic Metabolic Panel:  Recent Labs Lab  12/27/16 0048 12/28/16 0321 12/29/16 0438  NA 138 136 136  K 3.8 3.5 3.8  CL 101 100* 101  CO2 24 26 23   GLUCOSE 135* 120* 92  BUN 15 11 7   CREATININE 0.79 0.83 0.72  CALCIUM 9.2 8.9 8.5*  MG  --   --  2.1  PHOS  --   --  2.7    Liver Function Tests:  Recent Labs Lab 12/27/16 0048 12/28/16 0321 12/29/16 0438  AST 67* 41 44*  ALT 124* 83* 71*  ALKPHOS 171* 149* 142*  BILITOT 1.7* 5.7* 5.3*  PROT 7.0 7.2 6.4*  ALBUMIN 4.4 3.7 3.5    Recent Labs Lab 12/27/16 0048 12/28/16 0321 12/29/16 0438  LIPASE 789* 334* 183*   No results for input(s): AMMONIA in the last 168 hours.  CBC:  Recent Labs Lab 12/27/16 0048 12/28/16 0321 12/29/16 0438  WBC 18.8* 13.9* 11.6*  NEUTROABS  --   --  8.3*  HGB 13.9 13.6 13.0  HCT 41.1 41.1 38.5*  MCV 83.7 84.6 83.5  PLT 262 217 196    Cardiac Enzymes: No results for input(s): CKTOTAL, CKMB, CKMBINDEX, TROPONINI in the last 168 hours.  BNP: Invalid input(s): POCBNP  CBG: No results for input(s): GLUCAP in the last 168 hours.  Coagulation Studies: No results for input(s): LABPROT, INR in the last 72 hours.   Other results: Personal review of EKG shows :   NSR.   No acute ST or T wave change.   Imaging: Dg Ercp Biliary & Pancreatic Ducts  Result Date: 12/29/2016 CLINICAL DATA:  Common bile duct stones. EXAM: ERCP TECHNIQUE: Multiple spot images obtained with the fluoroscopic device and submitted for interpretation post-procedure. FLUOROSCOPY TIME:  Fluoroscopy Time:  3 minutes and 54 seconds Number of Acquired Spot Images: 3 COMPARISON:  Ultrasound 12/27/2016 FINDINGS: Wires advanced into the main pancreatic duct and the common bile duct. Contrast opacification of the extrahepatic biliary system. Cystic duct appears to be patent. Mild dilatation of the common bile duct. Question a small stone in the common bile duct. IMPRESSION: Cannulation and opacification of the biliary system for stone removal. These images were  submitted for radiologic interpretation only. Please see the procedural report for the amount of contrast and the fluoroscopy time utilized. Electronically Signed   By: Richarda Overlie M.D.   On: 12/29/2016 14:12        Assessment & Plan:  1. Coronary artery disease: Vishal is status post coronary artery bypass grafting in January, 2016. He has done well and has not had any recurrent angina. He now presents with cholelithiasis/cholecystitis.  He does not have any acute EKG changes. He does not have heart failure. In my opinion, he is at low risk for any cardiovascular complications prior to laparoscopic or open cholecystectomy. He has already had his ERCP today.   2. Essential hypertension:   His blood pressures fairly well-controlled.  3. Hyperlipidemia: Continue atorvastatin. I've advised him to work on a weight loss program.  4. Morbid obesity: Unfortunate, he has regained weight after his lap band procedure. He will need to get back on his strict diet.   Vesta Mixer, Montez Hageman., MD, [facility] 12/29/2016, 4:53 PM Office - [phone] Pager [phone]

## 2016-12-29 NOTE — Anesthesia Postprocedure Evaluation (Addendum)
Anesthesia Post Note  Patient: Hunter Gomez  Procedure(s) Performed: Procedure(s) (LRB): ENDOSCOPIC RETROGRADE CHOLANGIOPANCREATOGRAPHY (ERCP) WITH PROPOFOL (N/A)  Patient location during evaluation: Endoscopy Anesthesia Type: General Level of consciousness: awake and alert, awake and sedated Pain management: pain level controlled Vital Signs Assessment: post-procedure vital signs reviewed and stable Respiratory status: spontaneous breathing, nonlabored ventilation, respiratory function stable and patient connected to nasal cannula oxygen Cardiovascular status: blood pressure returned to baseline and stable Postop Assessment: no signs of nausea or vomiting Anesthetic complications: no       Last Vitals:  Vitals:   12/29/16 1350 12/29/16 1441  BP: 139/69 140/76  Pulse: 93 81  Resp: 18 18  Temp:  36.7 C    Last Pain:  Vitals:   12/29/16 1326  TempSrc: Oral  PainSc:                  Yacine Garriga,JAMES TERRILL

## 2016-12-29 NOTE — Consult Note (Signed)
[facility] Consult/Admission Note  Hunter Gomez 1971-11-01  [id number].    Requesting MD: Dr. Owens Loffler Chief Complaint/Reason for Consult: cholelithiasis   HPI:  PT is a 46 y.o. male with medical history significant for obesity status post lap band surgery, GERD, CAD status post CABG who presented to the [facility] ED with complaints of sudden onset abdominal pain. Patient states the pain in his upper mid abdomen, constant, severe, nonradiating. Nothing made it better, nothing that it worse. He took anti-flatus over-the-counter medication with little relief. He also took nitroglycerin prior to coming to the ED with no relief. Associated symptoms include nausea without vomiting. He denies fever, chills, headache, dizziness, syncope, CP, SOB, lower extremity edema or orthopnea. He denies dysuria or hematuria. Patient had previous episodes of pain like this in the past but they resolved.   ED Course:  T bili 1.7, AST 67, ALT 124, WBC 18.8, lipase 789 abdominal ultrasound concerning for cholelithiasis.  ROS:  Review of Systems  Constitutional: Negative for chills, diaphoresis and fever.  HENT: Negative for sore throat.   Respiratory: Negative for cough and shortness of breath.   Cardiovascular: Negative for chest pain and leg swelling.  Gastrointestinal: Positive for abdominal pain and nausea. Negative for blood in stool, constipation and vomiting.  Genitourinary: Negative for dysuria and hematuria.  Skin: Negative for rash.  Neurological: Negative for dizziness, loss of consciousness and headaches.  All other systems reviewed and are negative.    Family History  Problem Relation Age of Onset  . Diabetes Mother   . Hypertension Mother   . Hyperlipidemia Mother   . Heart disease Mother 63    CABG  . Peripheral vascular disease Mother   . Hypertension Father   . Hyperlipidemia Father   . Heart disease Father 62    CABG  . Stroke Father   . Hypertension Brother   .  Hyperlipidemia Brother   . Cancer Maternal Grandfather     stomach  . Cancer Paternal Grandmother   . Heart attack Neg Hx     Past Medical History:  Diagnosis Date  . CAD (coronary artery disease)    a.  NSTEMI (1/16):  LHC - dLM 95, oLAD 90, mLAD 30, oCFX 80-90, pCFX 50, oPDA 80, oPLA 75, EF 55-65% >> urgent CABG  . Hepatic steatosis   . HLD (hyperlipidemia)   . Hx of echocardiogram    Echo (2/16):  Mild LVH, EF 55-60%, Gr 2 DD, trivial AI/TR, no effusion  . Hypertension 10/2012  . Muscle weakness 1997   prior seizure like activity, but none since; prior neurology eval, prior normal EEG 1997  . Obesity    s/p Lap Band surgery  . Vision problem    prior use of glasses, not currently    Past Surgical History:  Procedure Laterality Date  . CORONARY ARTERY BYPASS GRAFT N/A 12/18/2014   Procedure: CORONARY ARTERY BYPASS GRAFTING (CABG), ON PUMP, TIMES FIVE, USING BILATERAL INTERNAL MAMMARY ARTERIES, RIGHT GREATER SAPHENOUS VEIN HARVESTED ENDOSCOPICALLY;  Surgeon: Ivin Poot, MD;  Location: [place];  Service: Open Heart Surgery;  Laterality: N/A;  LIMA-LAD, SVG-D, SVG-OM, Free RIMA-Ramus, SVG-PD  . INTRAOPERATIVE TRANSESOPHAGEAL ECHOCARDIOGRAM N/A 12/18/2014   Procedure: INTRAOPERATIVE TRANSESOPHAGEAL ECHOCARDIOGRAM;  Surgeon: Ivin Poot, MD;  Location: [place];  Service: Open Heart Surgery;  Laterality: N/A;  . LAPAROSCOPIC GASTRIC BANDING  2007   initial procedure Dr. Olevia Bowens in [place], adjustments in [place], [place]  2006  .  LEFT HEART CATHETERIZATION WITH CORONARY ANGIOGRAM N/A 12/18/2014   Procedure: LEFT HEART CATHETERIZATION WITH CORONARY ANGIOGRAM;  Surgeon: Blane Ohara, MD;  Location: [facility] CATH LAB;  Service: Cardiovascular;  Laterality: N/A;  . TONSILLECTOMY    . WISDOM TOOTH EXTRACTION      Social History:  reports that he has never smoked. He has never used smokeless tobacco. He reports that he does not drink alcohol or use  drugs.  Allergies: No Known Allergies  Medications Prior to Admission  Medication Sig Dispense Refill  . aspirin 81 MG EC tablet Take 1 tablet (81 mg total) by mouth daily.    Marland Kitchen atorvastatin (LIPITOR) 80 MG tablet TAKE 1 TABLET(80 MG) BY MOUTH DAILY 30 tablet 8  . lisinopril (PRINIVIL,ZESTRIL) 10 MG tablet TAKE 1 TABLET(10 MG) BY MOUTH DAILY 30 tablet 9  . metoprolol (LOPRESSOR) 50 MG tablet TAKE 1 TABLET(50 MG) BY MOUTH TWICE DAILY 60 tablet 9  . nitroGLYCERIN (NITROSTAT) 0.4 MG SL tablet Place 1 tablet (0.4 mg total) under the tongue every 5 (five) minutes as needed for chest pain. 25 tablet 11    Blood pressure 139/69, pulse 93, temperature 97.9 F (36.6 C), temperature source Oral, resp. rate 18, height _0  (1.753 m), weight 237 lb (107.5 kg), SpO2 94 %.  Physical Exam  Constitutional: He is oriented to person, place, and time and well-developed, well-nourished, and in no distress. No distress.  HENT:  Head: Normocephalic and atraumatic.  Eyes: Conjunctivae are normal. Right eye exhibits no discharge. Left eye exhibits no discharge. No scleral icterus.  Neck: Normal range of motion. Neck supple.  Cardiovascular: Normal rate, regular rhythm and normal heart sounds.  Exam reveals no gallop and no friction rub.   No murmur heard. Pulses:      Radial pulses are 2+ on the right side, and 2+ on the left side.       Dorsalis pedis pulses are 2+ on the right side, and 2+ on the left side.  Pulmonary/Chest: Effort normal and breath sounds normal. No respiratory distress. He has no wheezes. He has no rales.  Abdominal: Soft. Bowel sounds are normal. He exhibits no distension. There is no tenderness. There is no rebound.  Obese abdomen previous laparoscopic scars noted to upper abdomen  Musculoskeletal: Normal range of motion. He exhibits no edema or deformity.  Neurological: He is alert and oriented to person, place, and time. GCS score is 15.  Skin: Skin is warm and dry. No rash noted.  He is not diaphoretic.  Psychiatric: Mood and affect normal.  Nursing note and vitals reviewed.   Results for orders placed or performed during the hospital encounter of 12/27/16 (from the past 48 hour(s))  CBC     Status: Abnormal   Collection Time: 12/28/16  3:21 AM  Result Value Ref Range   WBC 13.9 (H) 4.0 - 10.5 K/uL   RBC 4.86 4.22 - 5.81 MIL/uL   Hemoglobin 13.6 13.0 - 17.0 g/dL   HCT 41.1 39.0 - 52.0 %   MCV 84.6 78.0 - 100.0 fL   MCH 28.0 26.0 - 34.0 pg   MCHC 33.1 30.0 - 36.0 g/dL   RDW 13.4 11.5 - 15.5 %   Platelets 217 150 - 400 K/uL  Comprehensive metabolic panel     Status: Abnormal   Collection Time: 12/28/16  3:21 AM  Result Value Ref Range   Sodium 136 135 - 145 mmol/L   Potassium 3.5 3.5 - 5.1 mmol/L   Chloride  100 (L) 101 - 111 mmol/L   CO2 26 22 - 32 mmol/L   Glucose, Bld 120 (H) 65 - 99 mg/dL   BUN 11 6 - 20 mg/dL   Creatinine, Ser 0.83 0.61 - 1.24 mg/dL   Calcium 8.9 8.9 - 10.3 mg/dL   Total Protein 7.2 6.5 - 8.1 g/dL   Albumin 3.7 3.5 - 5.0 g/dL   AST 41 15 - 41 U/L   ALT 83 (H) 17 - 63 U/L   Alkaline Phosphatase 149 (H) 38 - 126 U/L   Total Bilirubin 5.7 (H) 0.3 - 1.2 mg/dL   GFR calc non Af Amer >60 >60 mL/min   GFR calc Af Amer >60 >60 mL/min    Comment: (NOTE) The eGFR has been calculated using the CKD EPI equation. This calculation has not been validated in all clinical situations. eGFR's persistently <60 mL/min signify possible Chronic Kidney Disease.    Anion gap 10 5 - 15  Lipase, blood     Status: Abnormal   Collection Time: 12/28/16  3:21 AM  Result Value Ref Range   Lipase 334 (H) 11 - 51 U/L  CBC with Differential/Platelet     Status: Abnormal   Collection Time: 12/29/16  4:38 AM  Result Value Ref Range   WBC 11.6 (H) 4.0 - 10.5 K/uL   RBC 4.61 4.22 - 5.81 MIL/uL   Hemoglobin 13.0 13.0 - 17.0 g/dL   HCT 38.5 (L) 39.0 - 52.0 %   MCV 83.5 78.0 - 100.0 fL   MCH 28.2 26.0 - 34.0 pg   MCHC 33.8 30.0 - 36.0 g/dL   RDW 13.5 11.5 -  15.5 %   Platelets 196 150 - 400 K/uL   Neutrophils Relative % 71 %   Neutro Abs 8.3 (H) 1.7 - 7.7 K/uL   Lymphocytes Relative 17 %   Lymphs Abs 2.0 0.7 - 4.0 K/uL   Monocytes Relative 10 %   Monocytes Absolute 1.2 (H) 0.1 - 1.0 K/uL   Eosinophils Relative 2 %   Eosinophils Absolute 0.2 0.0 - 0.7 K/uL   Basophils Relative 0 %   Basophils Absolute 0.0 0.0 - 0.1 K/uL  Comprehensive metabolic panel     Status: Abnormal   Collection Time: 12/29/16  4:38 AM  Result Value Ref Range   Sodium 136 135 - 145 mmol/L   Potassium 3.8 3.5 - 5.1 mmol/L   Chloride 101 101 - 111 mmol/L   CO2 23 22 - 32 mmol/L   Glucose, Bld 92 65 - 99 mg/dL   BUN 7 6 - 20 mg/dL   Creatinine, Ser 0.72 0.61 - 1.24 mg/dL   Calcium 8.5 (L) 8.9 - 10.3 mg/dL   Total Protein 6.4 (L) 6.5 - 8.1 g/dL   Albumin 3.5 3.5 - 5.0 g/dL   AST 44 (H) 15 - 41 U/L   ALT 71 (H) 17 - 63 U/L   Alkaline Phosphatase 142 (H) 38 - 126 U/L   Total Bilirubin 5.3 (H) 0.3 - 1.2 mg/dL   GFR calc non Af Amer >60 >60 mL/min   GFR calc Af Amer >60 >60 mL/min    Comment: (NOTE) The eGFR has been calculated using the CKD EPI equation. This calculation has not been validated in all clinical situations. eGFR's persistently <60 mL/min signify possible Chronic Kidney Disease.    Anion gap 12 5 - 15  Magnesium     Status: None   Collection Time: 12/29/16  4:38 AM  Result Value Ref  Range   Magnesium 2.1 1.7 - 2.4 mg/dL  Phosphorus     Status: None   Collection Time: 12/29/16  4:38 AM  Result Value Ref Range   Phosphorus 2.7 2.5 - 4.6 mg/dL  Lipase, blood     Status: Abnormal   Collection Time: 12/29/16  4:38 AM  Result Value Ref Range   Lipase 183 (H) 11 - 51 U/L   Dg Ercp Biliary & Pancreatic Ducts  Result Date: 12/29/2016 CLINICAL DATA:  Common bile duct stones. EXAM: ERCP TECHNIQUE: Multiple spot images obtained with the fluoroscopic device and submitted for interpretation post-procedure. FLUOROSCOPY TIME:  Fluoroscopy Time:  3 minutes  and 54 seconds Number of Acquired Spot Images: 3 COMPARISON:  Ultrasound 12/27/2016 FINDINGS: Wires advanced into the main pancreatic duct and the common bile duct. Contrast opacification of the extrahepatic biliary system. Cystic duct appears to be patent. Mild dilatation of the common bile duct. Question a small stone in the common bile duct. IMPRESSION: Cannulation and opacification of the biliary system for stone removal. These images were submitted for radiologic interpretation only. Please see the procedural report for the amount of contrast and the fluoroscopy time utilized. Electronically Signed   By: Markus Daft M.D.   On: 12/29/2016 14:12      Assessment/Plan  Principal Problem:   Choledocholithiasis Active Problems:   Essential hypertension, benign   Obesity - status post LAP-BAND surgery   CAD (coronary artery disease) S/P CABG 2016?   HLD (hyperlipidemia)   Leukocytosis   Cholelithiasis   Hepatic steatosis   Elevated transaminase level   Pancreatitis, acute   Gallstone pancreatitis  - Prominent common bile duct.  - ERCP today for stone removal - LFTs and bilirubin trending down - Lipase today 183 - Patient not anticoagulated - Patient May need cardiac clearance prior to surgery - Will take patient to the OR for laparoscopic cholecystectomy once his lipase is resolved and patient receives cardiac clearance for surgery.  Thank you for the consult we will continue to follow this patient.   Kalman Drape, [facility] 12/29/2016, 2:37 PM Pager: [phone] Consults: [phone] Mon-Fri 7:00 am-4:30 pm Sat-Sun 7:00 am-11:30 am

## 2016-12-29 NOTE — Interval H&P Note (Signed)
History and Physical Interval Note:  12/29/2016 11:48 AM  Hunter Gomez  has presented today for surgery, with the diagnosis of bile duct stone.  biliary pancreatitis.  The various methods of treatment have been discussed with the patient and family. After consideration of risks, benefits and other options for treatment, the patient has consented to  Procedure(s): ENDOSCOPIC RETROGRADE CHOLANGIOPANCREATOGRAPHY (ERCP) WITH PROPOFOL (N/A) as a surgical intervention .  The patient's history has been reviewed, patient examined, no change in status, stable for surgery.  I have reviewed the patient's chart and labs.  Questions were answered to the patient's satisfaction.     Rachael FeeJacobs, Zanai Mallari P

## 2016-12-30 ENCOUNTER — Encounter (HOSPITAL_COMMUNITY): Payer: Self-pay | Admitting: Gastroenterology

## 2016-12-30 DIAGNOSIS — R17 Unspecified jaundice: Secondary | ICD-10-CM

## 2016-12-30 LAB — LIPASE, BLOOD: LIPASE: 138 U/L — AB (ref 11–51)

## 2016-12-30 LAB — CBC WITH DIFFERENTIAL/PLATELET
BASOS ABS: 0 10*3/uL (ref 0.0–0.1)
BASOS PCT: 0 %
EOS ABS: 0.3 10*3/uL (ref 0.0–0.7)
EOS PCT: 2 %
HEMATOCRIT: 35.9 % — AB (ref 39.0–52.0)
Hemoglobin: 12.1 g/dL — ABNORMAL LOW (ref 13.0–17.0)
LYMPHS ABS: 1.9 10*3/uL (ref 0.7–4.0)
LYMPHS PCT: 18 %
MCH: 27.9 pg (ref 26.0–34.0)
MCHC: 33.7 g/dL (ref 30.0–36.0)
MCV: 82.9 fL (ref 78.0–100.0)
MONOS PCT: 11 %
Monocytes Absolute: 1.2 10*3/uL — ABNORMAL HIGH (ref 0.1–1.0)
Neutro Abs: 7.5 10*3/uL (ref 1.7–7.7)
Neutrophils Relative %: 69 %
Platelets: 216 10*3/uL (ref 150–400)
RBC: 4.33 MIL/uL (ref 4.22–5.81)
RDW: 13.4 % (ref 11.5–15.5)
WBC: 10.9 10*3/uL — ABNORMAL HIGH (ref 4.0–10.5)

## 2016-12-30 LAB — MAGNESIUM: Magnesium: 2.2 mg/dL (ref 1.7–2.4)

## 2016-12-30 LAB — COMPREHENSIVE METABOLIC PANEL
ALBUMIN: 3 g/dL — AB (ref 3.5–5.0)
ALT: 79 U/L — ABNORMAL HIGH (ref 17–63)
ANION GAP: 10 (ref 5–15)
AST: 67 U/L — ABNORMAL HIGH (ref 15–41)
Alkaline Phosphatase: 190 U/L — ABNORMAL HIGH (ref 38–126)
BUN: 5 mg/dL — ABNORMAL LOW (ref 6–20)
CHLORIDE: 100 mmol/L — AB (ref 101–111)
CO2: 27 mmol/L (ref 22–32)
Calcium: 8.5 mg/dL — ABNORMAL LOW (ref 8.9–10.3)
Creatinine, Ser: 0.75 mg/dL (ref 0.61–1.24)
GFR calc non Af Amer: >60 mL/min (ref >60)
GLUCOSE: 122 mg/dL — AB (ref 65–99)
Potassium: 3.5 mmol/L (ref 3.5–5.1)
SODIUM: 137 mmol/L (ref 135–145)
Total Bilirubin: 4.6 mg/dL — ABNORMAL HIGH (ref 0.3–1.2)
Total Protein: 6.5 g/dL (ref 6.5–8.1)

## 2016-12-30 LAB — PHOSPHORUS: PHOSPHORUS: 2.1 mg/dL — AB (ref 2.5–4.6)

## 2016-12-30 LAB — SURGICAL PCR SCREEN
MRSA, PCR: NEGATIVE
STAPHYLOCOCCUS AUREUS: POSITIVE — AB

## 2016-12-30 MED ORDER — LOPERAMIDE HCL 2 MG PO CAPS
2.0000 mg | ORAL_CAPSULE | ORAL | Status: DC | PRN
  Administered 2016-12-30: 2 mg via ORAL
  Filled 2016-12-30: qty 1

## 2016-12-30 MED ORDER — NITROGLYCERIN 0.4 MG SL SUBL: 0.4000 mg | SUBLINGUAL_TABLET | SUBLINGUAL | Status: DC | PRN

## 2016-12-30 MED ORDER — LISINOPRIL 10 MG PO TABS
10.0000 mg | ORAL_TABLET | Freq: Every day | ORAL | Status: DC
  Administered 2016-12-30 – 2017-01-01 (×3): 10 mg via ORAL
  Filled 2016-12-30 (×3): qty 1

## 2016-12-30 NOTE — Progress Notes (Signed)
[facility] Progress Note    Since last GI note: ERCP yesterday; biliary sphincterotomy and removal of stone, stone debris.  He feels well without abd pain, nausea or vomiting.  Has not had anything to eat or drink this AM.  Cardiology consult; low risk for CV complications during lap or open cholecystectomy.  Surgery consult; lap chole after cardiology consult and pancreatitis resolves.  Objective: Vital signs in last 24 hours: Temp:  [97.9 F (36.6 C)-99.2 F (37.3 C)] 98.8 F (37.1 C) (02/06 0450) Pulse Rate:  [81-105] 92 (02/06 0450) Resp:  [14-19] 16 (02/06 0450) BP: (130-159)/(67-76) 137/70 (02/06 0450) SpO2:  [94 %-99 %] 95 % (02/06 0450) Weight:  [242 lb 8 oz (110 kg)] 242 lb 8 oz (110 kg) (02/06 0450) Last BM Date: 12/29/16 General: alert and oriented times 3 Heart: regular rate and rythm Abdomen: soft, non-tender, non-distended, normal bowel sounds   Lab Results:  Recent Labs  12/28/16 0321 12/29/16 0438 12/30/16 0502  WBC 13.9* 11.6* 10.9*  HGB 13.6 13.0 12.1*  PLT 217 196 216  MCV 84.6 83.5 82.9    Recent Labs  12/28/16 0321 12/29/16 0438 12/30/16 0502  NA 136 136 137  K 3.5 3.8 3.5  CL 100* 101 100*  CO2 26 23 27   GLUCOSE 120* 92 122*  BUN 11 7 5*  CREATININE 0.83 0.72 0.75  CALCIUM 8.9 8.5* 8.5*    Recent Labs  12/28/16 0321 12/29/16 0438 12/30/16 0502  PROT 7.2 6.4* 6.5  ALBUMIN 3.7 3.5 3.0*  AST 41 44* 67*  ALT 83* 71* 79*  ALKPHOS 149* 142* 190*  BILITOT 5.7* 5.3* 4.6*    Medications: Scheduled Meds: . aspirin EC  81 mg Oral Daily  . atorvastatin  80 mg Oral q1800  . indomethacin  100 mg Rectal Once  . metoprolol  50 mg Oral BID  . sodium chloride flush  3 mL Intravenous Q12H   Continuous Infusions: . sodium chloride 125 mL/hr (12/28/16 1940)   PRN Meds:.acetaminophen **OR** acetaminophen, morphine injection, ondansetron (ZOFRAN) IV, ondansetron **OR** ondansetron (ZOFRAN) IV    Assessment/Plan: 46 y.o.  male with biliary pancreatitis  Pancreatitis seems to have resolved, no exacerbation after ERCP yesterday.  I'm going to change his diet to NPO just in case he is able to have lap chole today (he has not had anything to eat or drink this morning).    Please call or page with any further questions or concerns.     Rachael FeeJacobs, Amnah Breuer P, MD  12/30/2016, 7:32 AM [facility] Pager [phone]

## 2016-12-30 NOTE — Progress Notes (Signed)
Progress Note  Patient Name: Hunter Gomez Date of Encounter: 12/30/2016  Primary Cardiologist: Excell Seltzer  Subjective   He feels better today after his ERCP yesterday.  Inpatient Medications    Scheduled Meds: . aspirin EC  81 mg Oral Daily  . atorvastatin  80 mg Oral q1800  . indomethacin  100 mg Rectal Once  . metoprolol  50 mg Oral BID  . sodium chloride flush  3 mL Intravenous Q12H   Continuous Infusions: . sodium chloride 125 mL/hr (12/28/16 1940)   PRN Meds: acetaminophen **OR** acetaminophen, morphine injection, ondansetron (ZOFRAN) IV, ondansetron **OR** ondansetron (ZOFRAN) IV   Vital Signs    Vitals:   12/29/16 1441 12/29/16 2104 12/30/16 0450 12/30/16 0900  BP: 140/76 130/71 137/70 (!) 142/80  Pulse: 81 81 92 84  Resp: 18 18 16 16   Temp: 98.1 F (36.7 C) 99.2 F (37.3 C) 98.8 F (37.1 C) 98.7 F (37.1 C)  TempSrc:  Oral Oral Oral  SpO2: 97% 95% 95% 95%  Weight:   242 lb 8 oz (110 kg)   Height:        Intake/Output Summary (Last 24 hours) at 12/30/16 1129 Last data filed at 12/30/16 0951  Gross per 24 hour  Intake             1423 ml  Output               20 ml  Net             1403 ml   Filed Weights   12/27/16 0629 12/30/16 0450  Weight: 237 lb (107.5 kg) 242 lb 8 oz (110 kg)    Physical Exam   General:Overweight male in no acute distress. Head: Normocephalic, atraumatic.  Neck: Supple without bruits, JVD Lungs:  Resp regular and unlabored, CTA. Heart: RRR, S1, S2, no S3, S4, or murmur; no rub. Abdomen: Soft, minimal tenderness Extremities: No clubbing, cyanosis, no edema Neuro: Alert and oriented X 3. Moves all extremities spontaneously. Psych: Normal affect.  Labs    Chemistry  Recent Labs Lab 12/28/16 0321 12/29/16 0438 12/30/16 0502  NA 136 136 137  K 3.5 3.8 3.5  CL 100* 101 100*  CO2 26 23 27   GLUCOSE 120* 92 122*  BUN 11 7 5*  CREATININE 0.83 0.72 0.75  CALCIUM 8.9 8.5* 8.5*  PROT 7.2 6.4* 6.5  ALBUMIN 3.7 3.5 3.0*   AST 41 44* 67*  ALT 83* 71* 79*  ALKPHOS 149* 142* 190*  BILITOT 5.7* 5.3* 4.6*  GFRNONAA >60 >60 >60  GFRAA >60 >60 >60  ANIONGAP 10 12 10      Hematology  Recent Labs Lab 12/28/16 0321 12/29/16 0438 12/30/16 0502  WBC 13.9* 11.6* 10.9*  RBC 4.86 4.61 4.33  HGB 13.6 13.0 12.1*  HCT 41.1 38.5* 35.9*  MCV 84.6 83.5 82.9  MCH 28.0 28.2 27.9  MCHC 33.1 33.8 33.7  RDW 13.4 13.5 13.4  PLT 217 196 216    Radiology    Dg Ercp Biliary & Pancreatic Ducts  Result Date: 12/29/2016 CLINICAL DATA:  Common bile duct stones. EXAM: ERCP TECHNIQUE: Multiple spot images obtained with the fluoroscopic device and submitted for interpretation post-procedure. FLUOROSCOPY TIME:  Fluoroscopy Time:  3 minutes and 54 seconds Number of Acquired Spot Images: 3 COMPARISON:  Ultrasound 12/27/2016 FINDINGS: Wires advanced into the main pancreatic duct and the common bile duct. Contrast opacification of the extrahepatic biliary system. Cystic duct appears to be patent. Mild dilatation of the  common bile duct. Question a small stone in the common bile duct. IMPRESSION: Cannulation and opacification of the biliary system for stone removal. These images were submitted for radiologic interpretation only. Please see the procedural report for the amount of contrast and the fluoroscopy time utilized. Electronically Signed   By: Richarda OverlieAdam  Henn M.D.   On: 12/29/2016 14:12    Patient Profile     46 y.o. male with a history of CAD s/p 3v CABG in 11/2014 for NSTEMI and lap band bariatric surgery admitted 12/27/16 for evaluation and anticipated lap chole.  Assessment & Plan    1. Coronary artery disease: He is asymptomatic since his bypass surgery 2 years ago in 11/2014. He has no other major risk factors. He does not exercise regularly but performs activity such as multiple flights of stairs without dyspnea or resting. Revised cardiac risk index places him at less than 1% risk of perioperative complication. He is entirely  asymptomatic throughout this admission. -No additional workup indicated -ASA 81mg , BB, ACEi should be fine perioperatively if otherwise appropriate  2. Essential hypertension:  Mildly elevated. -Could resume home lisinopril for mildly high BPs and no renal insufficiency  3. Hyperlipidemia: -Continue atorvastatin  4. Morbid obesity: Significant weight regained after lap banding -Needs to keep working on diet outpatient   He is low risk and having no active problems. He is at a low perioperative cardiac risk for this procedure. We may stop following routinely, please call with any new questions or if condition changes.   Fuller Planhristopher W Rice, MD PGY-II Internal Medicine Resident Pager# [phone] 12/30/2016, 11:29 AM   Attending Note:   The patient was seen and examined.  Agree with assessment and plan as noted above.  Changes made to the above note as needed.  Patient seen and independently examined with Orest Dikeshris Rice, MD - PGY-2 .   We discussed all aspects of the encounter. I agree with the assessment and plan as stated above.  1. CAD - pt has done well. Scheduled for lap chole tomorrow  He should be at lot risk for the surgery     I have spent a total of 40 minutes with patient reviewing hospital  notes , telemetry, EKGs, labs and examining patient as well as establishing an assessment and plan that was discussed with the patient. > 50% of time was spent in direct patient care.    Vesta MixerPhilip J. Laraine Samet, Montez HagemanJr., MD, [facility] 12/31/2016, 11:03 AM [address],  [address] Office [phone] Pager [phone]

## 2016-12-30 NOTE — Progress Notes (Signed)
PROGRESS NOTE    Hunter Gomez  WUJ:[id number] DOB: Jan 15, 1971 DOA: 12/27/2016 PCP: Ernst Breach, PA-C   Brief Narrative:  HPI: Hunter Gomez is a very pleasant 46 y.o. male with medical history significant for attention, obesity status post lap band surgery, GERD, CAD status post CABG presents to the emergency Department chief complaint of sudden onset abdominal pain. He reports being in his usual state of health yesterday when he developed sudden sharp abdominal pain. Describes the pain as sharp constant located in the epigastric and lower quadrants. He took anti-flatus over-the-counter medication with little relief. He also took nitroglycerin prior to coming to the ED with no relief. Associated symptoms include nausea without vomiting. He denies fever chills headache dizziness syncope or near-syncope. He denies chest pain palpitation shortness of breath lower extremity edema or orthopnea. He denies dysuria hematuria frequency or urgency. Initial evaluation includes abdominal ultrasound concerning for choledocholithiasis and Pancreatitis. Patient underwent ERCP with biliary sphincterotomy 12/29/16 by Dr. Christella Hartigan. Cardiology consulted for preoperative clearance for Cholecystectomy and is low surgical risk. Likely to go for laparoscopic cholecystectomy tomorrow AM.  Assessment & Plan:   Principal Problem:   Common bile duct stone Active Problems:   Essential hypertension, benign   Obesity   CAD (coronary artery disease)   HLD (hyperlipidemia)   Leukocytosis   Cholelithiasis   Hepatic steatosis   Elevated transaminase level   Pancreatitis, acute  1. Choledocholithiasis s/p ERCP with Biliary Sphincterotomy and Balloon Sweeping POD1 -Abdominal ultrasound with sludge and stones in the gallbladder out findings of acute cholecystitis. Prominent common bile duct. Elevated transaminase elevated lipase. -Leukocytosis is Improving from 18.8 -> 13.9 -> 11.6 ->10.9; patient is afebrile and nontoxic  appearing -Vigorous IV fluids with 1/2 NS at 125 mL/hr decreased to 75 mL/hr -Supportive therapy with Pain control with Morphine 2 mg IV q3hprn and Zofran for N/V -Antibiotics now D/C'd -BMET in am -GI following and appreciated evaluation and additional recc's.  -General Surgery consulted for eventual Cholecystectomy because of cholelithaisis   2. Cholelithiasis -Now that ERCP is done consult General Surgery for Cholecystectomy -Consulted Cardiology for Pre-operative Clearance given Hx of CAD and CABG and deemed patient low surgical risk -Patient to likely go for Cholecystectomy in AM  3. Biliary Pancreatitis  -Acute. Related to above. Lipase improved from 798 -> 334 -> 183 -> 138 -Repeat Lipase Level in AM -See #1 -IV fluids at 1/2 NS 125 mL/hr decreased to 75 mL/hr -Monitor  4. Elevated Transaminase Level/Hepatic Steatosis and Obstructive Jaundice.  -LFT's now AST is 67 and ALT is 79 -See abdominal ultrasound. Likely related to #1.  -Total bilirubin 1.7 -> 5.7 -> 5.3 -> 4.6.  -Denies EtOH use. -Treatment as Above -Outpatient Management for Steatosis  -Will continue Atorvastatin for now and if LFT's significantly increase will D/C  5. Hypertension.  -C/w Metoprolol 50 mg po BID and restarted Lisinopril 10 mg daily -Monitor blood pressure closely -If necessary will add IV Hydralazine  6. Hypertriglyceridemia -Patient' Lipid Panel showed Cholesterol of 127, HDL of 35, LDL of 61, TG of 157, and VLDL of 31 -C/w Atorvastatin 81 mg po Daily  7. CAD Status post CABG.  -No chest pain. EKG without acute changes. -C/w ASA 81 mg po Daily, Atorvastatin 80 mg po qHS, Metoprolol 50 mg po BID, and Lisinopril 10 mg po Daily -C/w SL NTG 0.4 mg q56minprn CP -Cardiology Consulted for Preoperative Clearance and deemed low surgical risk  8. Obesity.  -Status post lap band surgery. BMI 35.17  DVT prophylaxis: SCDs Code Status: FULL CODE Family Communication: No Family present at  bedside Disposition Plan: Inpatient as Cholecystectomy likely in AM  Consultants:   Gastroenterology  General Surgery  Cardiology  Procedures:   Abdominal Ultrasound; ERCP with Biliary Sphincterotomy    Antimicrobials:  Anti-infectives    Start     Dose/Rate Route Frequency Ordered Stop   12/27/16 0830  piperacillin-tazobactam (ZOSYN) IVPB 3.375 g  Status:  Discontinued     3.375 g 12.5 mL/hr over 240 Minutes Intravenous Every 8 hours 12/27/16 0738 12/28/16 1718   12/27/16 0800  ciprofloxacin (CIPRO) IVPB 400 mg  Status:  Discontinued    Comments:  Cipro 400 mg IV q12hrs for CrCl >30   400 mg 200 mL/hr over 60 Minutes Intravenous Every 12 hours 12/27/16 0732 12/27/16 0813     Subjective: Seen and examined at bedside today and had no complaints and no abdominal pain. Was wanting to know when Surgery would happen. No CP or SOB. No Nausea or Vomiting.   Objective: Vitals:   12/29/16 2104 12/30/16 0450 12/30/16 0900 12/30/16 1100  BP: 130/71 137/70 (!) 142/80 139/79  Pulse: 81 92 84 89  Resp: 18 16 16 16   Temp: 99.2 F (37.3 C) 98.8 F (37.1 C) 98.7 F (37.1 C) 98.8 F (37.1 C)  TempSrc: Oral Oral Oral Oral  SpO2: 95% 95% 95% 95%  Weight:  110 kg (242 lb 8 oz)    Height:        Intake/Output Summary (Last 24 hours) at 12/30/16 1204 Last data filed at 12/30/16 0951  Gross per 24 hour  Intake             1423 ml  Output               20 ml  Net             1403 ml   Filed Weights   12/27/16 0629 12/30/16 0450  Weight: 107.5 kg (237 lb) 110 kg (242 lb 8 oz)   Examination: Physical Exam:  Constitutional: WN/WD obese Caucasian male, NAD and appears calm and comfortable; Yellowish Hue on patient from Jaundice  Eyes: Lids and conjunctivae normal, sclerae are icteric;  ENMT: External Ears, Nose appear normal. Grossly normal hearing.  Neck: Appears normal, supple, no cervical masses, normal ROM, no appreciable thyromegaly, no JVD Respiratory: Clear to auscultation  bilaterally, no wheezing, rales, rhonchi or crackles. Normal respiratory effort and patient is not tachypenic. No accessory muscle use.  Cardiovascular: RRR, no murmurs / rubs / gallops. S1 and S2 auscultated.  Abdomen: Soft, Non-tender to palpation, non-distended. No masses palpated. No appreciable hepatosplenomegaly. Bowel sounds positive x4.  GU: Deferred. Musculoskeletal: No clubbing / cyanosis of digits/nails. No joint deformity upper and lower extremities. No Contractures. Skin: Yellowish coloring of skin. No rashes, lesions or bruising on limited skin eval. Warm and dry. Neurologic: CN 2-12 grossly intact with no focal deficits. Sensation intact in all 4 Extremities. Romberg sign cerebellar reflexes not assessed.  Psychiatric: Normal judgment and insight. Alert and oriented x 3. Normal mood and appropriate affect.   Data Reviewed: I have personally reviewed following labs and imaging studies  CBC:  Recent Labs Lab 12/27/16 0048 12/28/16 0321 12/29/16 0438 12/30/16 0502  WBC 18.8* 13.9* 11.6* 10.9*  NEUTROABS  --   --  8.3* 7.5  HGB 13.9 13.6 13.0 12.1*  HCT 41.1 41.1 38.5* 35.9*  MCV 83.7 84.6 83.5 82.9  PLT 262 217 196 216  Basic Metabolic Panel:  Recent Labs Lab 12/27/16 0048 12/28/16 0321 12/29/16 0438 12/30/16 0502  NA 138 136 136 137  K 3.8 3.5 3.8 3.5  CL 101 100* 101 100*  CO2 24 26 23 27   GLUCOSE 135* 120* 92 122*  BUN 15 11 7  5*  CREATININE 0.79 0.83 0.72 0.75  CALCIUM 9.2 8.9 8.5* 8.5*  MG  --   --  2.1 2.2  PHOS  --   --  2.7 2.1*   GFR: Estimated Creatinine Clearance: 142.5 mL/min (by C-G formula based on SCr of 0.75 mg/dL). Liver Function Tests:  Recent Labs Lab 12/27/16 0048 12/28/16 0321 12/29/16 0438 12/30/16 0502  AST 67* 41 44* 67*  ALT 124* 83* 71* 79*  ALKPHOS 171* 149* 142* 190*  BILITOT 1.7* 5.7* 5.3* 4.6*  PROT 7.0 7.2 6.4* 6.5  ALBUMIN 4.4 3.7 3.5 3.0*    Recent Labs Lab 12/27/16 0048 12/28/16 0321 12/29/16 0438  12/30/16 0502  LIPASE 789* 334* 183* 138*   No results for input(s): AMMONIA in the last 168 hours. Coagulation Profile: No results for input(s): INR, PROTIME in the last 168 hours. Cardiac Enzymes: No results for input(s): CKTOTAL, CKMB, CKMBINDEX, TROPONINI in the last 168 hours. BNP (last 3 results) No results for input(s): PROBNP in the last 8760 hours. HbA1C: No results for input(s): HGBA1C in the last 72 hours. CBG: No results for input(s): GLUCAP in the last 168 hours. Lipid Profile: No results for input(s): CHOL, HDL, LDLCALC, TRIG, CHOLHDL, LDLDIRECT in the last 72 hours. Thyroid Function Tests: No results for input(s): TSH, T4TOTAL, FREET4, T3FREE, THYROIDAB in the last 72 hours. Anemia Panel: No results for input(s): VITAMINB12, FOLATE, FERRITIN, TIBC, IRON, RETICCTPCT in the last 72 hours. Sepsis Labs: No results for input(s): PROCALCITON, LATICACIDVEN in the last 168 hours.  No results found for this or any previous visit (from the past 240 hour(s)).   Radiology Studies: Dg Ercp Biliary & Pancreatic Ducts  Result Date: 12/29/2016 CLINICAL DATA:  Common bile duct stones. EXAM: ERCP TECHNIQUE: Multiple spot images obtained with the fluoroscopic device and submitted for interpretation post-procedure. FLUOROSCOPY TIME:  Fluoroscopy Time:  3 minutes and 54 seconds Number of Acquired Spot Images: 3 COMPARISON:  Ultrasound 12/27/2016 FINDINGS: Wires advanced into the main pancreatic duct and the common bile duct. Contrast opacification of the extrahepatic biliary system. Cystic duct appears to be patent. Mild dilatation of the common bile duct. Question a small stone in the common bile duct. IMPRESSION: Cannulation and opacification of the biliary system for stone removal. These images were submitted for radiologic interpretation only. Please see the procedural report for the amount of contrast and the fluoroscopy time utilized. Electronically Signed   By: Richarda Overlie M.D.   On:  12/29/2016 14:12   Scheduled Meds: . aspirin EC  81 mg Oral Daily  . atorvastatin  80 mg Oral q1800  . indomethacin  100 mg Rectal Once  . metoprolol  50 mg Oral BID  . sodium chloride flush  3 mL Intravenous Q12H   Continuous Infusions: . sodium chloride 125 mL/hr (12/28/16 1940)    LOS: 3 days   Merlene Laughter, DO [facility] Pager [phone]  If 7PM-7AM, please contact night-coverage [email or web address] Password [email or web address] 12/30/2016, 12:04 PM

## 2016-12-30 NOTE — Progress Notes (Signed)
[facility] Progress Note  1 Day Post-Op  Subjective: Denies fever, abdominal pain, nausea, vomiting. Had a loose BM yesterday. Tolerating PO. Wife at bedside. Laparoscopic cholecystectomy procedure, as well as risk/benefits, discussed with patient and wife. All questions answered.  Objective: Vital signs in last 24 hours: Temp:  [97.9 F (36.6 C)-99.2 F (37.3 C)] 98.8 F (37.1 C) (02/06 1100) Pulse Rate:  [81-105] 89 (02/06 1100) Resp:  [14-19] 16 (02/06 1100) BP: (130-151)/(67-80) 139/79 (02/06 1100) SpO2:  [94 %-97 %] 95 % (02/06 1100) Weight:  [110 kg (242 lb 8 oz)] 110 kg (242 lb 8 oz) (02/06 0450) Last BM Date: 12/29/16  Intake/Output from previous day: 02/05 0701 - 02/06 0700 In: 1423 [P.O.:720; I.V.:703] Out: 20 [Blood:20] Intake/Output this shift: Total I/O In: 3 [I.V.:3] Out: -   PE: Gen:  Alert, NAD, pleasant Pulm:  Normal effort Abd: Soft, non-tender, non-distended, bowel sounds present in all four quadrants.  Lab Results:   Recent Labs  12/29/16 0438 12/30/16 0502  WBC 11.6* 10.9*  HGB 13.0 12.1*  HCT 38.5* 35.9*  PLT 196 216   BMET  Recent Labs  12/29/16 0438 12/30/16 0502  NA 136 137  K 3.8 3.5  CL 101 100*  CO2 23 27  GLUCOSE 92 122*  BUN 7 5*  CREATININE 0.72 0.75  CALCIUM 8.5* 8.5*   PT/INR No results for input(s): LABPROT, INR in the last 72 hours. CMP     Component Value Date/Time   NA 137 12/30/2016 0502   K 3.5 12/30/2016 0502   CL 100 (L) 12/30/2016 0502   CO2 27 12/30/2016 0502   GLUCOSE 122 (H) 12/30/2016 0502   BUN 5 (L) 12/30/2016 0502   CREATININE 0.75 12/30/2016 0502   CREATININE 0.81 07/01/2016 1111   CALCIUM 8.5 (L) 12/30/2016 0502   PROT 6.5 12/30/2016 0502   ALBUMIN 3.0 (L) 12/30/2016 0502   AST 67 (H) 12/30/2016 0502   ALT 79 (H) 12/30/2016 0502   ALKPHOS 190 (H) 12/30/2016 0502   BILITOT 4.6 (H) 12/30/2016 0502   GFRNONAA >60 12/30/2016 0502   GFRAA >60 12/30/2016 0502   Lipase      Component Value Date/Time   LIPASE 138 (H) 12/30/2016 0502       Studies/Results: Dg Ercp Biliary & Pancreatic Ducts  Result Date: 12/29/2016 CLINICAL DATA:  Common bile duct stones. EXAM: ERCP TECHNIQUE: Multiple spot images obtained with the fluoroscopic device and submitted for interpretation post-procedure. FLUOROSCOPY TIME:  Fluoroscopy Time:  3 minutes and 54 seconds Number of Acquired Spot Images: 3 COMPARISON:  Ultrasound 12/27/2016 FINDINGS: Wires advanced into the main pancreatic duct and the common bile duct. Contrast opacification of the extrahepatic biliary system. Cystic duct appears to be patent. Mild dilatation of the common bile duct. Question a small stone in the common bile duct. IMPRESSION: Cannulation and opacification of the biliary system for stone removal. These images were submitted for radiologic interpretation only. Please see the procedural report for the amount of contrast and the fluoroscopy time utilized. Electronically Signed   By: Richarda Overlie M.D.   On: 12/29/2016 14:12    Anti-infectives: Anti-infectives    Start     Dose/Rate Route Frequency Ordered Stop   12/27/16 0830  piperacillin-tazobactam (ZOSYN) IVPB 3.375 g  Status:  Discontinued     3.375 g 12.5 mL/hr over 240 Minutes Intravenous Every 8 hours 12/27/16 0738 12/28/16 1718   12/27/16 0800  ciprofloxacin (CIPRO) IVPB 400 mg  Status:  Discontinued  Comments:  Cipro 400 mg IV q12hrs for CrCl >30   400 mg 200 mL/hr over 60 Minutes Intravenous Every 12 hours 12/27/16 0732 12/27/16 0813       Assessment/Plan Gallstone pancreatitis  S/P ERCP w/ biliary sphincterotomy 12/29/16 Dr. Christella HartiganJacobs - non-tender on exam - lipase 138, repeat in AM - evaluation by cardiology : low surgical risk - will plan for laparoscopic cholecystectomy tomorrow by Dr. Luisa Hartornett     LOS: 3 days    Adam PhenixElizabeth S Simaan , [facility] 12/30/2016, 11:54 AM Pager: [phone] Consults: [phone] Mon-Fri  7:00 am-4:30 pm Sat-Sun 7:00 am-11:30 am

## 2016-12-30 NOTE — Plan of Care (Signed)
Problem: Pain Managment: Goal: General experience of comfort will improve Outcome: Progressing Denies pain and discomfort  Problem: Tissue Perfusion: Goal: Risk factors for ineffective tissue perfusion will decrease Outcome: Progressing No S/S of DVT noted  Problem: Activity: Goal: Risk for activity intolerance will decrease Outcome: Progressing Tolerates activity well  Problem: Nutrition: Goal: Adequate nutrition will be maintained Outcome: Progressing Tolerated diet well  Problem: Bowel/Gastric: Goal: Will not experience complications related to bowel motility Outcome: Progressing Bowel sounds active and strong all 4 quadrants, passing flatus and had BM yesterday

## 2016-12-31 ENCOUNTER — Inpatient Hospital Stay (HOSPITAL_COMMUNITY): Payer: BLUE CROSS/BLUE SHIELD | Admitting: Certified Registered Nurse Anesthetist

## 2016-12-31 ENCOUNTER — Encounter (HOSPITAL_COMMUNITY)
Admission: EM | Disposition: A | Discharge status: Home / Self Care | Payer: Self-pay | Source: Home / Self Care | Attending: Internal Medicine

## 2016-12-31 ENCOUNTER — Encounter (HOSPITAL_COMMUNITY): Payer: Self-pay | Admitting: *Deleted

## 2016-12-31 HISTORY — PX: CHOLECYSTECTOMY: SHX55

## 2016-12-31 LAB — CBC WITH DIFFERENTIAL/PLATELET
Basophils Absolute: 0 10*3/uL (ref 0.0–0.1)
Basophils Relative: 0 %
Eosinophils Absolute: 0.4 10*3/uL (ref 0.0–0.7)
Eosinophils Relative: 3 %
HCT: 38.9 % — ABNORMAL LOW (ref 39.0–52.0)
Hemoglobin: 13 g/dL (ref 13.0–17.0)
LYMPHS ABS: 2.5 10*3/uL (ref 0.7–4.0)
LYMPHS PCT: 22 %
MCH: 28.1 pg (ref 26.0–34.0)
MCHC: 33.4 g/dL (ref 30.0–36.0)
MCV: 84 fL (ref 78.0–100.0)
Monocytes Absolute: 1.1 10*3/uL — ABNORMAL HIGH (ref 0.1–1.0)
Monocytes Relative: 9 %
Neutro Abs: 7.5 10*3/uL (ref 1.7–7.7)
Neutrophils Relative %: 66 %
PLATELETS: 285 10*3/uL (ref 150–400)
RBC: 4.63 MIL/uL (ref 4.22–5.81)
RDW: 14 % (ref 11.5–15.5)
WBC: 11.5 10*3/uL — AB (ref 4.0–10.5)

## 2016-12-31 LAB — PHOSPHORUS: Phosphorus: 2.7 mg/dL (ref 2.5–4.6)

## 2016-12-31 LAB — COMPREHENSIVE METABOLIC PANEL
ALK PHOS: 320 U/L — AB (ref 38–126)
ALT: 118 U/L — AB (ref 17–63)
AST: 108 U/L — ABNORMAL HIGH (ref 15–41)
Albumin: 3.2 g/dL — ABNORMAL LOW (ref 3.5–5.0)
Anion gap: 11 (ref 5–15)
BILIRUBIN TOTAL: 2.6 mg/dL — AB (ref 0.3–1.2)
BUN: 5 mg/dL — ABNORMAL LOW (ref 6–20)
CALCIUM: 8.9 mg/dL (ref 8.9–10.3)
CHLORIDE: 100 mmol/L — AB (ref 101–111)
CO2: 28 mmol/L (ref 22–32)
CREATININE: 0.73 mg/dL (ref 0.61–1.24)
Glucose, Bld: 109 mg/dL — ABNORMAL HIGH (ref 65–99)
Potassium: 3.6 mmol/L (ref 3.5–5.1)
Sodium: 139 mmol/L (ref 135–145)
TOTAL PROTEIN: 6.8 g/dL (ref 6.5–8.1)

## 2016-12-31 LAB — LIPASE, BLOOD: LIPASE: 132 U/L — AB (ref 11–51)

## 2016-12-31 LAB — MAGNESIUM: MAGNESIUM: 2.2 mg/dL (ref 1.7–2.4)

## 2016-12-31 SURGERY — LAPAROSCOPIC CHOLECYSTECTOMY WITH INTRAOPERATIVE CHOLANGIOGRAM
Anesthesia: General | Site: Abdomen

## 2016-12-31 MED ORDER — PROPOFOL 10 MG/ML IV BOLUS
INTRAVENOUS | Status: DC | PRN
  Administered 2016-12-31: 30 mg via INTRAVENOUS
  Administered 2016-12-31: 150 mg via INTRAVENOUS
  Administered 2016-12-31: 20 mg via INTRAVENOUS

## 2016-12-31 MED ORDER — BUPIVACAINE HCL (PF) 0.25 % IJ SOLN
INTRAMUSCULAR | Status: AC
  Filled 2016-12-31: qty 30

## 2016-12-31 MED ORDER — PROPOFOL 10 MG/ML IV BOLUS
INTRAVENOUS | Status: AC
  Filled 2016-12-31: qty 20

## 2016-12-31 MED ORDER — OXYCODONE HCL 5 MG PO TABS: 5.0000 mg | ORAL_TABLET | Freq: Once | ORAL | Status: DC | PRN

## 2016-12-31 MED ORDER — MIDAZOLAM HCL 2 MG/2ML IJ SOLN
INTRAMUSCULAR | Status: AC
  Filled 2016-12-31: qty 2

## 2016-12-31 MED ORDER — ONDANSETRON HCL 4 MG/2ML IJ SOLN
INTRAMUSCULAR | Status: AC
  Filled 2016-12-31: qty 6

## 2016-12-31 MED ORDER — IOPAMIDOL (ISOVUE-300) INJECTION 61%
INTRAVENOUS | Status: AC
  Filled 2016-12-31: qty 50

## 2016-12-31 MED ORDER — 0.9 % SODIUM CHLORIDE (POUR BTL) OPTIME
TOPICAL | Status: DC | PRN
  Administered 2016-12-31: 1000 mL

## 2016-12-31 MED ORDER — LIDOCAINE 2% (20 MG/ML) 5 ML SYRINGE
INTRAMUSCULAR | Status: AC
  Filled 2016-12-31: qty 5

## 2016-12-31 MED ORDER — LIDOCAINE HCL (CARDIAC) 20 MG/ML IV SOLN
INTRAVENOUS | Status: DC | PRN
  Administered 2016-12-31: 60 mg via INTRAVENOUS

## 2016-12-31 MED ORDER — HEMOSTATIC AGENTS (NO CHARGE) OPTIME
TOPICAL | Status: DC | PRN
  Administered 2016-12-31: 1

## 2016-12-31 MED ORDER — ONDANSETRON HCL 4 MG/2ML IJ SOLN
INTRAMUSCULAR | Status: DC | PRN
  Administered 2016-12-31: 4 mg via INTRAVENOUS

## 2016-12-31 MED ORDER — OXYCODONE HCL 5 MG/5ML PO SOLN: 5.0000 mg | Freq: Once | ORAL | Status: DC | PRN

## 2016-12-31 MED ORDER — FENTANYL CITRATE (PF) 100 MCG/2ML IJ SOLN
25.0000 ug | INTRAMUSCULAR | Status: DC | PRN
  Administered 2016-12-31 (×2): 50 ug via INTRAVENOUS

## 2016-12-31 MED ORDER — DEXAMETHASONE SODIUM PHOSPHATE 4 MG/ML IJ SOLN
INTRAMUSCULAR | Status: DC | PRN
  Administered 2016-12-31: 8 mg via INTRAVENOUS

## 2016-12-31 MED ORDER — FENTANYL CITRATE (PF) 100 MCG/2ML IJ SOLN
INTRAMUSCULAR | Status: DC | PRN
  Administered 2016-12-31: 50 ug via INTRAVENOUS
  Administered 2016-12-31 (×2): 100 ug via INTRAVENOUS
  Administered 2016-12-31: 50 ug via INTRAVENOUS

## 2016-12-31 MED ORDER — CEFAZOLIN SODIUM-DEXTROSE 2-4 GM/100ML-% IV SOLN: 2.0000 g | INTRAVENOUS | Status: DC

## 2016-12-31 MED ORDER — CEFAZOLIN SODIUM 1 G IJ SOLR
INTRAMUSCULAR | Status: DC | PRN
  Administered 2016-12-31: 2 g via INTRAMUSCULAR

## 2016-12-31 MED ORDER — SODIUM CHLORIDE 0.9 % IR SOLN
Status: DC | PRN
  Administered 2016-12-31: 1000 mL

## 2016-12-31 MED ORDER — LACTATED RINGERS IV SOLN
INTRAVENOUS | Status: DC
  Administered 2016-12-31 (×2): via INTRAVENOUS

## 2016-12-31 MED ORDER — PHENYLEPHRINE 40 MCG/ML (10ML) SYRINGE FOR IV PUSH (FOR BLOOD PRESSURE SUPPORT)
PREFILLED_SYRINGE | INTRAVENOUS | Status: AC
  Filled 2016-12-31: qty 10

## 2016-12-31 MED ORDER — SUGAMMADEX SODIUM 200 MG/2ML IV SOLN
INTRAVENOUS | Status: AC
  Filled 2016-12-31: qty 6

## 2016-12-31 MED ORDER — SUGAMMADEX SODIUM 200 MG/2ML IV SOLN
INTRAVENOUS | Status: DC | PRN
  Administered 2016-12-31: 200 mg via INTRAVENOUS

## 2016-12-31 MED ORDER — ROCURONIUM BROMIDE 100 MG/10ML IV SOLN
INTRAVENOUS | Status: DC | PRN
  Administered 2016-12-31: 50 mg via INTRAVENOUS

## 2016-12-31 MED ORDER — MIDAZOLAM HCL 5 MG/5ML IJ SOLN
INTRAMUSCULAR | Status: DC | PRN
  Administered 2016-12-31: 2 mg via INTRAVENOUS

## 2016-12-31 MED ORDER — FENTANYL CITRATE (PF) 100 MCG/2ML IJ SOLN
INTRAMUSCULAR | Status: AC
  Filled 2016-12-31: qty 4

## 2016-12-31 MED ORDER — FENTANYL CITRATE (PF) 100 MCG/2ML IJ SOLN
INTRAMUSCULAR | Status: AC
  Administered 2016-12-31: 50 ug via INTRAVENOUS
  Filled 2016-12-31: qty 2

## 2016-12-31 MED ORDER — CEFAZOLIN SODIUM 1 G IJ SOLR
INTRAMUSCULAR | Status: AC
  Filled 2016-12-31: qty 20

## 2016-12-31 MED ORDER — FENTANYL CITRATE (PF) 100 MCG/2ML IJ SOLN
INTRAMUSCULAR | Status: AC
  Filled 2016-12-31: qty 2

## 2016-12-31 MED ORDER — BUPIVACAINE HCL (PF) 0.25 % IJ SOLN
INTRAMUSCULAR | Status: DC | PRN
  Administered 2016-12-31: 4 mL

## 2016-12-31 MED ORDER — ROCURONIUM BROMIDE 50 MG/5ML IV SOSY
PREFILLED_SYRINGE | INTRAVENOUS | Status: AC
  Filled 2016-12-31: qty 5

## 2016-12-31 SURGICAL SUPPLY — 42 items
APPLIER CLIP ROT 10 11.4 M/L (STAPLE) ×3
BLADE SURG ROTATE 9660 (MISCELLANEOUS) ×3 IMPLANT
CANISTER SUCTION 2500CC (MISCELLANEOUS) ×3 IMPLANT
CHLORAPREP W/TINT 26ML (MISCELLANEOUS) ×3 IMPLANT
CLIP APPLIE ROT 10 11.4 M/L (STAPLE) ×1 IMPLANT
COVER MAYO STAND STRL (DRAPES) ×3 IMPLANT
COVER SURGICAL LIGHT HANDLE (MISCELLANEOUS) ×3 IMPLANT
DERMABOND ADVANCED (GAUZE/BANDAGES/DRESSINGS) ×2
DERMABOND ADVANCED .7 DNX12 (GAUZE/BANDAGES/DRESSINGS) ×1 IMPLANT
DRAPE C-ARM 42X72 X-RAY (DRAPES) ×3 IMPLANT
ELECT REM PT RETURN 9FT ADLT (ELECTROSURGICAL) ×3
ELECTRODE REM PT RTRN 9FT ADLT (ELECTROSURGICAL) ×1 IMPLANT
ENDOLOOP SUT PDS II  0 18 (SUTURE) ×2
ENDOLOOP SUT PDS II 0 18 (SUTURE) ×1 IMPLANT
GLOVE BIO SURGEON STRL SZ8 (GLOVE) ×3 IMPLANT
GLOVE BIOGEL PI IND STRL 6.5 (GLOVE) ×1 IMPLANT
GLOVE BIOGEL PI IND STRL 8 (GLOVE) ×1 IMPLANT
GLOVE BIOGEL PI INDICATOR 6.5 (GLOVE) ×2
GLOVE BIOGEL PI INDICATOR 8 (GLOVE) ×2
GLOVE ECLIPSE 6.0 STRL STRAW (GLOVE) ×3 IMPLANT
GOWN STRL REUS W/ TWL LRG LVL3 (GOWN DISPOSABLE) ×2 IMPLANT
GOWN STRL REUS W/ TWL XL LVL3 (GOWN DISPOSABLE) ×1 IMPLANT
GOWN STRL REUS W/TWL LRG LVL3 (GOWN DISPOSABLE) ×4
GOWN STRL REUS W/TWL XL LVL3 (GOWN DISPOSABLE) ×2
HEMOSTAT SNOW SURGICEL 2X4 (HEMOSTASIS) ×3 IMPLANT
KIT BASIN OR (CUSTOM PROCEDURE TRAY) ×3 IMPLANT
KIT ROOM TURNOVER OR (KITS) ×3 IMPLANT
NS IRRIG 1000ML POUR BTL (IV SOLUTION) ×3 IMPLANT
PAD ARMBOARD 7.5X6 YLW CONV (MISCELLANEOUS) ×3 IMPLANT
POUCH SPECIMEN RETRIEVAL 10MM (ENDOMECHANICALS) ×3 IMPLANT
SCISSORS LAP 5X35 DISP (ENDOMECHANICALS) ×3 IMPLANT
SET CHOLANGIOGRAPH 5 50 .035 (SET/KITS/TRAYS/PACK) ×3 IMPLANT
SET IRRIG TUBING LAPAROSCOPIC (IRRIGATION / IRRIGATOR) ×3 IMPLANT
SLEEVE ENDOPATH XCEL 5M (ENDOMECHANICALS) ×3 IMPLANT
SPECIMEN JAR SMALL (MISCELLANEOUS) ×3 IMPLANT
SUT MNCRL AB 4-0 PS2 18 (SUTURE) ×3 IMPLANT
TOWEL OR 17X24 6PK STRL BLUE (TOWEL DISPOSABLE) ×3 IMPLANT
TRAY LAPAROSCOPIC MC (CUSTOM PROCEDURE TRAY) ×3 IMPLANT
TROCAR XCEL BLUNT TIP 100MML (ENDOMECHANICALS) ×3 IMPLANT
TROCAR XCEL NON-BLD 11X100MML (ENDOMECHANICALS) ×3 IMPLANT
TROCAR XCEL NON-BLD 5MMX100MML (ENDOMECHANICALS) ×3 IMPLANT
TUBING INSUFFLATION (TUBING) ×3 IMPLANT

## 2016-12-31 NOTE — Op Note (Signed)
Laparoscopic Cholecystectomy Procedure Note  Indications: This patient presents with symptomatic gallbladder disease and will undergo laparoscopic cholecystectomy. The procedure has been discussed with the patient. Operative and non operative treatments have been discussed. Risks of surgery include bleeding, infection,  Common bile duct injury,  Injury to the stomach,liver, colon,small intestine, abdominal wall,  Diaphragm,  Major blood vessels,  And the need for an open procedure.  Other risks include worsening of medical problems, death,  DVT and pulmonary embolism, and cardiovascular events.   Medical options have also been discussed. The patient has been informed of long term expectations of surgery and non surgical options,  The patient agrees to proceed.    Patient had a preoperative ERCP for common duct stones and sphincterotomy. He is been cleared for surgery.  Pre-operative Diagnosis: Calculus of gallbladder and bile duct with acute cholecystitis, without mention of obstruction  Post-operative Diagnosis: Same  Surgeon: Tatianna Ibbotson A.   Assistants: OR   Anesthesia: General endotracheal anesthesia and LOCAL anesthesia  ASA Class: 3  Procedure Details  The patient was seen again in the Holding Room. The risks, benefits, complications, treatment options, and expected outcomes were discussed with the patient. The possibilities of reaction to medication, pulmonary aspiration, perforation of viscus, bleeding, recurrent infection, finding a normal gallbladder, the need for additional procedures, failure to diagnose a condition, the possible need to convert to an open procedure, and creating a complication requiring transfusion or operation were discussed with the patient. The patient and/or family concurred with the proposed plan, giving informed consent. The site of surgery properly noted/marked. The patient was taken to Operating Room, identified as Hunter Gomez and the procedure verified as  Laparoscopic Cholecystectomy with Intraoperative Cholangiograms. A Time Out was held and the above information confirmed.  Prior to the induction of general anesthesia, antibiotic prophylaxis was administered. General endotracheal anesthesia was then administered and tolerated well. After the induction, the abdomen was prepped in the usual sterile fashion. The patient was positioned in the supine position with the left arm comfortably tucked, along with some reverse Trendelenburg.  Local anesthetic agent was injected into the skin near the umbilicus and an incision made. The midline fascia was incised and the Hasson technique was used to introduce a 12 mm port under direct vision. It was secured with a figure of eight Vicryl suture placed in the usual fashion. Pneumoperitoneum was then created with CO2 and tolerated well without any adverse changes in the patient's vital signs. Additional trocars were introduced under direct vision with an 11 mm trocar in the epigastrium and 2 5 mm trocars in the right upper quadrant. All skin incisions were infiltrated with a local anesthetic agent before making the incision and placing the trocars. Of note patient has gastric band in place. All ports are placed well away from the reservoir  and tubing.  The gallbladder was identified, the fundus grasped and retracted cephalad. Adhesions were lysed bluntly and with the electrocautery where indicated, taking care not to injure any adjacent organs or viscus. The infundibulum was grasped and retracted laterally, exposing the peritoneum overlying the triangle of Calot. This was then divided and exposed in a blunt fashion. The cystic duct was clearly identified and bluntly dissected circumferentially. The junctions of the gallbladder, cystic duct and common bile duct were clearly identified prior to the division of any linear structure.   The patient a large cystic duct. The junction of the cystic duct and common duct were  visualized. Given the large size the cystic  duct, I elected to place an Endoloop to close the cystic duct stump and forego cholangiogram.   Of note the patient had  a preoperative ERCP.  The cystic duct was then  ligated with an Endoloop   on the patient side and  clipped on the gallbladder side and divided. The cystic artery was identified, dissected free, ligated with clips and divided as well. Posterior cystic artery clipped and divided.  The gallbladder was dissected from the liver bed in retrograde fashion with the electrocautery. The gallbladder was removed. The liver bed was irrigated and inspected. Hemostasis was achieved with the electrocautery. Copious irrigation was utilized and was repeatedly aspirated until clear all particulate matter. Hemostasis was achieved  With cautery and Sugicel with no signs of bleeding or bile leakage.  Pneumoperitoneum was completely reduced after viewing removal of the trocars under direct vision. The wound was thoroughly irrigated and the fascia was then closed with a figure of eight suture; the skin was then closed with 4 - 0 monocyl  and a sterile dressing was applied.  Instrument, sponge, and needle counts were correct at closure and at the conclusion of the case.   Findings: Cholecystitis with Cholelithiasis  Estimated Blood Loss: Minimal         Drains: None         Total IV Fluids: 800 mL         Specimens: Gallbladder           Complications: None; patient tolerated the procedure well.         Disposition: PACU - hemodynamically stable.         Condition: stable

## 2016-12-31 NOTE — H&P (View-Only) (Signed)
[facility] Progress Note  1 Day Post-Op  Subjective: Denies fever, abdominal pain, nausea, vomiting. Had a loose BM yesterday. Tolerating PO. Wife at bedside. Laparoscopic cholecystectomy procedure, as well as risk/benefits, discussed with patient and wife. All questions answered.  Objective: Vital signs in last 24 hours: Temp:  [97.9 F (36.6 C)-99.2 F (37.3 C)] 98.8 F (37.1 C) (02/06 1100) Pulse Rate:  [81-105] 89 (02/06 1100) Resp:  [14-19] 16 (02/06 1100) BP: (130-151)/(67-80) 139/79 (02/06 1100) SpO2:  [94 %-97 %] 95 % (02/06 1100) Weight:  [110 kg (242 lb 8 oz)] 110 kg (242 lb 8 oz) (02/06 0450) Last BM Date: 12/29/16  Intake/Output from previous day: 02/05 0701 - 02/06 0700 In: 1423 [P.O.:720; I.V.:703] Out: 20 [Blood:20] Intake/Output this shift: Total I/O In: 3 [I.V.:3] Out: -   PE: Gen:  Alert, NAD, pleasant Pulm:  Normal effort Abd: Soft, non-tender, non-distended, bowel sounds present in all four quadrants.  Lab Results:   Recent Labs  12/29/16 0438 12/30/16 0502  WBC 11.6* 10.9*  HGB 13.0 12.1*  HCT 38.5* 35.9*  PLT 196 216   BMET  Recent Labs  12/29/16 0438 12/30/16 0502  NA 136 137  K 3.8 3.5  CL 101 100*  CO2 23 27  GLUCOSE 92 122*  BUN 7 5*  CREATININE 0.72 0.75  CALCIUM 8.5* 8.5*   PT/INR No results for input(s): LABPROT, INR in the last 72 hours. CMP     Component Value Date/Time   NA 137 12/30/2016 0502   K 3.5 12/30/2016 0502   CL 100 (L) 12/30/2016 0502   CO2 27 12/30/2016 0502   GLUCOSE 122 (H) 12/30/2016 0502   BUN 5 (L) 12/30/2016 0502   CREATININE 0.75 12/30/2016 0502   CREATININE 0.81 07/01/2016 1111   CALCIUM 8.5 (L) 12/30/2016 0502   PROT 6.5 12/30/2016 0502   ALBUMIN 3.0 (L) 12/30/2016 0502   AST 67 (H) 12/30/2016 0502   ALT 79 (H) 12/30/2016 0502   ALKPHOS 190 (H) 12/30/2016 0502   BILITOT 4.6 (H) 12/30/2016 0502   GFRNONAA >60 12/30/2016 0502   GFRAA >60 12/30/2016 0502   Lipase      Component Value Date/Time   LIPASE 138 (H) 12/30/2016 0502       Studies/Results: Dg Ercp Biliary & Pancreatic Ducts  Result Date: 12/29/2016 CLINICAL DATA:  Common bile duct stones. EXAM: ERCP TECHNIQUE: Multiple spot images obtained with the fluoroscopic device and submitted for interpretation post-procedure. FLUOROSCOPY TIME:  Fluoroscopy Time:  3 minutes and 54 seconds Number of Acquired Spot Images: 3 COMPARISON:  Ultrasound 12/27/2016 FINDINGS: Wires advanced into the main pancreatic duct and the common bile duct. Contrast opacification of the extrahepatic biliary system. Cystic duct appears to be patent. Mild dilatation of the common bile duct. Question a small stone in the common bile duct. IMPRESSION: Cannulation and opacification of the biliary system for stone removal. These images were submitted for radiologic interpretation only. Please see the procedural report for the amount of contrast and the fluoroscopy time utilized. Electronically Signed   By: Richarda Overlie M.D.   On: 12/29/2016 14:12    Anti-infectives: Anti-infectives    Start     Dose/Rate Route Frequency Ordered Stop   12/27/16 0830  piperacillin-tazobactam (ZOSYN) IVPB 3.375 g  Status:  Discontinued     3.375 g 12.5 mL/hr over 240 Minutes Intravenous Every 8 hours 12/27/16 0738 12/28/16 1718   12/27/16 0800  ciprofloxacin (CIPRO) IVPB 400 mg  Status:  Discontinued  Comments:  Cipro 400 mg IV q12hrs for CrCl >30   400 mg 200 mL/hr over 60 Minutes Intravenous Every 12 hours 12/27/16 0732 12/27/16 0813       Assessment/Plan Gallstone pancreatitis  S/P ERCP w/ biliary sphincterotomy 12/29/16 Dr. Christella HartiganJacobs - non-tender on exam - lipase 138, repeat in AM - evaluation by cardiology : low surgical risk - will plan for laparoscopic cholecystectomy tomorrow by Dr. Luisa Hartornett     LOS: 3 days    Adam PhenixElizabeth S Simaan , [facility] 12/30/2016, 11:54 AM Pager: [phone] Consults: [phone] Mon-Fri  7:00 am-4:30 pm Sat-Sun 7:00 am-11:30 am

## 2016-12-31 NOTE — Anesthesia Preprocedure Evaluation (Signed)
Anesthesia Evaluation  Patient identified by MRN, date of birth, ID band Patient awake    Reviewed: Allergy & Precautions, NPO status , Patient's Chart, lab work & pertinent test results  History of Anesthesia Complications Negative for: history of anesthetic complications  Airway Mallampati: II  TM Distance: >3 FB Neck ROM: Full    Dental  (+) Teeth Intact, Dental Advisory Given   Pulmonary neg pulmonary ROS,    breath sounds clear to auscultation       Cardiovascular hypertension, Pt. on medications + Past MI and + CABG   Rhythm:Regular Rate:Normal     Neuro/Psych negative neurological ROS  negative psych ROS   GI/Hepatic Neg liver ROS, Gallstone pancreatitis    Endo/Other  Morbid obesity  Renal/GU negative Renal ROS     Musculoskeletal negative musculoskeletal ROS (+)   Abdominal   Peds  Hematology  (+) anemia ,   Anesthesia Other Findings   Reproductive/Obstetrics                             Anesthesia Physical Anesthesia Plan  ASA: II  Anesthesia Plan: General   Post-op Pain Management:    Induction: Intravenous  Airway Management Planned: Oral ETT  Additional Equipment: None  Intra-op Plan:   Post-operative Plan: Extubation in OR  Informed Consent: I have reviewed the patients History and Physical, chart, labs and discussed the procedure including the risks, benefits and alternatives for the proposed anesthesia with the patient or authorized representative who has indicated his/her understanding and acceptance.   Dental advisory given  Plan Discussed with: CRNA and Surgeon  Anesthesia Plan Comments:         Anesthesia Quick Evaluation

## 2016-12-31 NOTE — Transfer of Care (Signed)
Immediate Anesthesia Transfer of Care Note  Patient: Hunter Gomez  Procedure(s) Performed: Procedure(s): LAPAROSCOPIC CHOLECYSTECTOMY (N/A)  Patient Location: PACU  Anesthesia Type:General  Level of Consciousness: awake, alert , oriented and patient cooperative  Airway & Oxygen Therapy: Patient Spontanous Breathing and Patient connected to nasal cannula oxygen  Post-op Assessment: Report given to RN, Post -op Vital signs reviewed and stable, Patient moving all extremities and Patient moving all extremities X 4  Post vital signs: Reviewed and stable  Last Vitals:  Vitals:   12/31/16 1313 12/31/16 1736  BP: (!) 121/97 (!) 141/79  Pulse: 60 73  Resp: 18 (!) 21  Temp: 36.8 C 36.7 C    Last Pain:  Vitals:   12/31/16 1313  TempSrc: Oral  PainSc:       Patients Stated Pain Goal: 1 (12/29/16 1050)  Complications: No apparent anesthesia complications

## 2016-12-31 NOTE — Anesthesia Procedure Notes (Addendum)
Procedure Name: Intubation Date/Time: 12/31/2016 4:05 PM Performed by: Oletta Lamas Pre-anesthesia Checklist: Patient identified, Emergency Drugs available, Suction available and Patient being monitored Patient Re-evaluated:Patient Re-evaluated prior to inductionOxygen Delivery Method: Circle System Utilized Preoxygenation: Pre-oxygenation with 100% oxygen Intubation Type: IV induction Ventilation: Mask ventilation without difficulty Laryngoscope Size: Mac, Sabra Heck and 2 Grade View: Grade II Tube type: Oral Tube size: 7.5 mm Number of attempts: 3 Airway Equipment and Method: Stylet Placement Confirmation: ETT inserted through vocal cords under direct vision,  positive ETCO2 and breath sounds checked- equal and bilateral Secured at: 2 cm Tube secured with: Tape Dental Injury: Injury to lip  Comments: 75m laceration to left lower lip.  No bleeding.

## 2016-12-31 NOTE — Progress Notes (Signed)
PROGRESS NOTE        PATIENT DETAILS Name: Hunter Gomez Age: 46 y.o. Sex: male Date of Birth: 06/16/1971 Admit Date: 12/27/2016 Admitting Physician Clydie Braun, MD ZOX:[id number], DAVID Vincenza Hews, PA-C  Brief Narrative: Patient is a 46 y.o. male with history of CAD status post CABG, obesity status post lap band surgery admitted with gallstone pancreatitis, further workup demonstrated choledocholithiasis-patient underwent ERCP. Plans are for laparoscopic cholecystectomy today. See below for further details  Subjective: No abdominal pain. Ambulating in the room.  Assessment/Plan: Biliary pancreatitis with choledocholithiasis: Pancreatitis has essentially resolved, abdomen is soft and nontender this morning. Underwent ERCP on 2/5 with biliary sphincterotomy and balloon sweeping. Subsequently seen by general surgery with plans for laparoscopic cholecystectomy on 2/7. LFTs slowly down trending.  Hypertension: Controlled with metoprolol and lisinopril.  History of CAD status post CABG: Continue aspirin  Obesity: Status post lap band surgery, continue to encourage weight loss.  DVT Prophylaxis:  SCD's  Code Status: Full code   Family Communication: Spouse at bedside  Disposition Plan: Remain inpatient-home on 2/8-if post operative course uncomplicated  Antimicrobial agents: Anti-infectives    Start     Dose/Rate Route Frequency Ordered Stop   12/31/16 1200  ceFAZolin (ANCEF) IVPB 2g/100 mL premix     2 g 200 mL/hr over 30 Minutes Intravenous To Short Stay 12/31/16 0308 01/01/17 1200   12/27/16 0830  piperacillin-tazobactam (ZOSYN) IVPB 3.375 g  Status:  Discontinued     3.375 g 12.5 mL/hr over 240 Minutes Intravenous Every 8 hours 12/27/16 0738 12/28/16 1718   12/27/16 0800  ciprofloxacin (CIPRO) IVPB 400 mg  Status:  Discontinued    Comments:  Cipro 400 mg IV q12hrs for CrCl >30   400 mg 200 mL/hr over 60 Minutes Intravenous Every 12 hours 12/27/16  0732 12/27/16 0813      Procedures: ERCP 2/5>>  CONSULTS:  GI and general surgery  Time spent: 25- minutes-Greater than 50% of this time was spent in counseling, explanation of diagnosis, planning of further management, and coordination of care.  MEDICATIONS: Scheduled Meds: . [MAR Hold] aspirin EC  81 mg Oral Daily  . [MAR Hold] atorvastatin  80 mg Oral q1800  .  ceFAZolin (ANCEF) IV  2 g Intravenous To SSTC  . [MAR Hold] indomethacin  100 mg Rectal Once  . [MAR Hold] lisinopril  10 mg Oral Daily  . [MAR Hold] metoprolol  50 mg Oral BID  . [MAR Hold] sodium chloride flush  3 mL Intravenous Q12H   Continuous Infusions: . sodium chloride 75 mL/hr at 12/30/16 2236  . lactated ringers 10 mL/hr at 12/31/16 1504   PRN Meds:.[MAR Hold] acetaminophen **OR** [MAR Hold] acetaminophen, [MAR Hold] loperamide, [MAR Hold]  morphine injection, [MAR Hold] nitroGLYCERIN, [MAR Hold] ondansetron (ZOFRAN) IV, [MAR Hold] ondansetron **OR** [MAR Hold] ondansetron (ZOFRAN) IV   PHYSICAL EXAM: Vital signs: Vitals:   12/30/16 1538 12/30/16 2109 12/31/16 0639 12/31/16 1313  BP: 132/80 (!) 142/73  (!) 121/97  Pulse: 71 72  60  Resp:  16  18  Temp:  99.1 F (37.3 C)  98.2 F (36.8 C)  TempSrc:  Oral  Oral  SpO2:  99%  100%  Weight:   106.3 kg (234 lb 5.6 oz)   Height:       Filed Weights   12/27/16 0629 12/30/16 0450 12/31/16 9811  Weight: 107.5 kg (237 lb) 110 kg (242 lb 8 oz) 106.3 kg (234 lb 5.6 oz)   Body mass index is 34.61 kg/m.   General appearance :Awake, alert, not in any distress. Speech Clear. Not toxic Looking Eyes:, pupils equally reactive to light and accomodation,no scleral icterus.Pink conjunctiva HEENT: Atraumatic and Normocephalic Neck: supple, no JVD. No cervical lymphadenopathy. No thyromegaly Resp:Good air entry bilaterally, no added sounds  CVS: S1 S2 regular, no murmurs.  GI: Bowel sounds present, Non tender and not distended with no gaurding, rigidity or  rebound.No organomegaly Extremities: B/L Lower Ext shows no edema, both legs are warm to touch Neurology:  speech clear,Non focal, sensation is grossly intact. Psychiatric: Normal judgment and insight. Alert and oriented x 3. Normal mood. Musculoskeletal:No digital cyanosis Skin:No Rash, warm and dry Wounds:N/A  I have personally reviewed following labs and imaging studies  LABORATORY DATA: CBC:  Recent Labs Lab 12/27/16 0048 12/28/16 0321 12/29/16 0438 12/30/16 0502 12/31/16 0633  WBC 18.8* 13.9* 11.6* 10.9* 11.5*  NEUTROABS  --   --  8.3* 7.5 7.5  HGB 13.9 13.6 13.0 12.1* 13.0  HCT 41.1 41.1 38.5* 35.9* 38.9*  MCV 83.7 84.6 83.5 82.9 84.0  PLT 262 217 196 216 285    Basic Metabolic Panel:  Recent Labs Lab 12/27/16 0048 12/28/16 0321 12/29/16 0438 12/30/16 0502 12/31/16 0633  NA 138 136 136 137 139  K 3.8 3.5 3.8 3.5 3.6  CL 101 100* 101 100* 100*  CO2 24 26 23 27 28   GLUCOSE 135* 120* 92 122* 109*  BUN 15 11 7  5* 5*  CREATININE 0.79 0.83 0.72 0.75 0.73  CALCIUM 9.2 8.9 8.5* 8.5* 8.9  MG  --   --  2.1 2.2 2.2  PHOS  --   --  2.7 2.1* 2.7    GFR: Estimated Creatinine Clearance: 140 mL/min (by C-G formula based on SCr of 0.73 mg/dL).  Liver Function Tests:  Recent Labs Lab 12/27/16 0048 12/28/16 0321 12/29/16 0438 12/30/16 0502 12/31/16 0633  AST 67* 41 44* 67* 108*  ALT 124* 83* 71* 79* 118*  ALKPHOS 171* 149* 142* 190* 320*  BILITOT 1.7* 5.7* 5.3* 4.6* 2.6*  PROT 7.0 7.2 6.4* 6.5 6.8  ALBUMIN 4.4 3.7 3.5 3.0* 3.2*    Recent Labs Lab 12/27/16 0048 12/28/16 0321 12/29/16 0438 12/30/16 0502 12/31/16 0633  LIPASE 789* 334* 183* 138* 132*   No results for input(s): AMMONIA in the last 168 hours.  Coagulation Profile: No results for input(s): INR, PROTIME in the last 168 hours.  Cardiac Enzymes: No results for input(s): CKTOTAL, CKMB, CKMBINDEX, TROPONINI in the last 168 hours.  BNP (last 3 results) No results for input(s): PROBNP in  the last 8760 hours.  HbA1C: No results for input(s): HGBA1C in the last 72 hours.  CBG: No results for input(s): GLUCAP in the last 168 hours.  Lipid Profile: No results for input(s): CHOL, HDL, LDLCALC, TRIG, CHOLHDL, LDLDIRECT in the last 72 hours.  Thyroid Function Tests: No results for input(s): TSH, T4TOTAL, FREET4, T3FREE, THYROIDAB in the last 72 hours.  Anemia Panel: No results for input(s): VITAMINB12, FOLATE, FERRITIN, TIBC, IRON, RETICCTPCT in the last 72 hours.  Urine analysis:    Component Value Date/Time   COLORURINE AMBER (A) 12/27/2016 0050   APPEARANCEUR CLEAR 12/27/2016 0050   LABSPEC 1.024 12/27/2016 0050   PHURINE 5.0 12/27/2016 0050   GLUCOSEU NEGATIVE 12/27/2016 0050   HGBUR NEGATIVE 12/27/2016 0050   BILIRUBINUR NEGATIVE 12/27/2016 0050  BILIRUBINUR NEG 10/25/2012 0937   KETONESUR 5 (A) 12/27/2016 0050   PROTEINUR NEGATIVE 12/27/2016 0050   UROBILINOGEN 0.2 12/18/2014 1036   NITRITE NEGATIVE 12/27/2016 0050   LEUKOCYTESUR NEGATIVE 12/27/2016 0050    Sepsis Labs: Lactic Acid, Venous No results found for: LATICACIDVEN  MICROBIOLOGY: Recent Results (from the past 240 hour(s))  Surgical pcr screen     Status: Abnormal   Collection Time: 12/30/16  6:18 PM  Result Value Ref Range Status   MRSA, PCR NEGATIVE NEGATIVE Final   Staphylococcus aureus POSITIVE (A) NEGATIVE Final    Comment:        The Xpert SA Assay (FDA approved for NASAL specimens in patients over 46 years of age), is one component of a comprehensive surveillance program.  Test performance has been validated by [facility] for patients greater than or equal to 46 year old. It is not intended to diagnose infection nor to guide or monitor treatment.     RADIOLOGY STUDIES/RESULTS: Dg Chest 2 View  Result Date: 12/27/2016 CLINICAL DATA:  Mid chest pain, epigastric pain. EXAM: CHEST  2 VIEW COMPARISON:  Radiographs 01/17/2015 FINDINGS: Patient is post median sternotomy. The  heart is normal in size. Mediastinal contours are normal. No pulmonary edema, pleural fluid or pneumothorax. No focal airspace disease. Minimal scarring at the left lung base. Gastric band in the upper abdomen. IMPRESSION: No acute abnormality. Electronically Signed   By: Rubye OaksMelanie  Ehinger M.D.   On: 12/27/2016 02:44   Dg Ercp Biliary & Pancreatic Ducts  Result Date: 12/29/2016 CLINICAL DATA:  Common bile duct stones. EXAM: ERCP TECHNIQUE: Multiple spot images obtained with the fluoroscopic device and submitted for interpretation post-procedure. FLUOROSCOPY TIME:  Fluoroscopy Time:  3 minutes and 54 seconds Number of Acquired Spot Images: 3 COMPARISON:  Ultrasound 12/27/2016 FINDINGS: Wires advanced into the main pancreatic duct and the common bile duct. Contrast opacification of the extrahepatic biliary system. Cystic duct appears to be patent. Mild dilatation of the common bile duct. Question a small stone in the common bile duct. IMPRESSION: Cannulation and opacification of the biliary system for stone removal. These images were submitted for radiologic interpretation only. Please see the procedural report for the amount of contrast and the fluoroscopy time utilized. Electronically Signed   By: Richarda OverlieAdam  Henn M.D.   On: 12/29/2016 14:12   [place] Abdomen Limited Ruq  Result Date: 12/27/2016 CLINICAL DATA:  Pancreatitis.  Abdominal pain for 1 day. EXAM: US ABDOMEN LIMITED - RIGHT UPPER QUADRANT COMPARISON:  None. FINDINGS: Gallbladder: Gallbladder is physiologically distended. Stones in intraluminal sludge within the gallbladder, including stones in the gallbladder neck. No gallbladder wall thickening. No pericholecystic fluid. No sonographic Murphy sign noted by sonographer. Common bile duct: Diameter: 7 mm. Questionable 5 mm stone in the distal common bile duct. Liver: Increased and heterogeneous in echogenicity. Liver parenchyma is difficult to penetrate no evidence of focal lesion. Limited left lobe visualization.  Normal directional flow in the main portal vein. IMPRESSION: 1. Sludge and stones in the gallbladder without sonographic findings of acute cholecystitis. 2. Prominent common bile duct for age measuring 7 mm, with a questionable 5 mm stone distally. 3. Hepatic steatosis. Electronically Signed   By: Rubye OaksMelanie  Ehinger M.D.   On: 12/27/2016 04:40     LOS: 4 days   Jeoffrey MassedGHIMIRE,SHANKER, MD  [facility] Pager:[phone]  If 7PM-7AM, please contact night-coverage [email or web address] Password [email or web address] 12/31/2016, 3:19 PM

## 2016-12-31 NOTE — Progress Notes (Signed)
Progress Note  Patient Name: Hunter Gomez Date of Encounter: 12/31/2016  Primary Cardiologist: Excell Seltzer  Subjective   Scheduled for surgery today   Inpatient Medications    Scheduled Meds: . aspirin EC  81 mg Oral Daily  . atorvastatin  80 mg Oral q1800  .  ceFAZolin (ANCEF) IV  2 g Intravenous To SSTC  . indomethacin  100 mg Rectal Once  . lisinopril  10 mg Oral Daily  . metoprolol  50 mg Oral BID  . sodium chloride flush  3 mL Intravenous Q12H   Continuous Infusions: . sodium chloride 75 mL/hr at 12/30/16 2236   PRN Meds: acetaminophen **OR** acetaminophen, loperamide, morphine injection, nitroGLYCERIN, ondansetron (ZOFRAN) IV, ondansetron **OR** ondansetron (ZOFRAN) IV   Vital Signs    Vitals:   12/30/16 1100 12/30/16 1538 12/30/16 2109 12/31/16 0639  BP: 139/79 132/80 (!) 142/73   Pulse: 89 71 72   Resp: 16  16   Temp: 98.8 F (37.1 C)  99.1 F (37.3 C)   TempSrc: Oral  Oral   SpO2: 95%  99%   Weight:    234 lb 5.6 oz (106.3 kg)  Height:        Intake/Output Summary (Last 24 hours) at 12/31/16 1106 Last data filed at 12/30/16 2143  Gross per 24 hour  Intake                3 ml  Output                0 ml  Net                3 ml   Filed Weights   12/27/16 0629 12/30/16 0450 12/31/16 0639  Weight: 237 lb (107.5 kg) 242 lb 8 oz (110 kg) 234 lb 5.6 oz (106.3 kg)    Physical Exam   General:Overweight male in no acute distress. Head: Normocephalic, atraumatic.  Neck: Supple without bruits, JVD Lungs:  Resp regular and unlabored, CTA. Heart: RRR, S1, S2, no S3, S4, or murmur; no rub. Abdomen: Soft, minimal tenderness Extremities: No clubbing, cyanosis, no edema Neuro: Alert and oriented X 3. Moves all extremities spontaneously. Psych: Normal affect.  Labs    Chemistry  Recent Labs Lab 12/29/16 0438 12/30/16 0502 12/31/16 0633  NA 136 137 139  K 3.8 3.5 3.6  CL 101 100* 100*  CO2 23 27 28   GLUCOSE 92 122* 109*  BUN 7 5* 5*  CREATININE  0.72 0.75 0.73  CALCIUM 8.5* 8.5* 8.9  PROT 6.4* 6.5 6.8  ALBUMIN 3.5 3.0* 3.2*  AST 44* 67* 108*  ALT 71* 79* 118*  ALKPHOS 142* 190* 320*  BILITOT 5.3* 4.6* 2.6*  GFRNONAA >60 >60 >60  GFRAA >60 >60 >60  ANIONGAP 12 10 11      Hematology  Recent Labs Lab 12/29/16 0438 12/30/16 0502 12/31/16 0633  WBC 11.6* 10.9* 11.5*  RBC 4.61 4.33 4.63  HGB 13.0 12.1* 13.0  HCT 38.5* 35.9* 38.9*  MCV 83.5 82.9 84.0  MCH 28.2 27.9 28.1  MCHC 33.8 33.7 33.4  RDW 13.5 13.4 14.0  PLT 196 216 285    Radiology    Dg Ercp Biliary & Pancreatic Ducts  Result Date: 12/29/2016 CLINICAL DATA:  Common bile duct stones. EXAM: ERCP TECHNIQUE: Multiple spot images obtained with the fluoroscopic device and submitted for interpretation post-procedure. FLUOROSCOPY TIME:  Fluoroscopy Time:  3 minutes and 54 seconds Number of Acquired Spot Images: 3 COMPARISON:  Ultrasound 12/27/2016 FINDINGS:  Wires advanced into the main pancreatic duct and the common bile duct. Contrast opacification of the extrahepatic biliary system. Cystic duct appears to be patent. Mild dilatation of the common bile duct. Question a small stone in the common bile duct. IMPRESSION: Cannulation and opacification of the biliary system for stone removal. These images were submitted for radiologic interpretation only. Please see the procedural report for the amount of contrast and the fluoroscopy time utilized. Electronically Signed   By: Richarda OverlieAdam  Henn M.D.   On: 12/29/2016 14:12    Patient Profile     46 y.o. male with a history of CAD s/p 3v CABG in 11/2014 for NSTEMI and lap band bariatric surgery admitted 12/27/16 for evaluation and anticipated lap chole.  Assessment & Plan    1. Coronary artery disease: He is asymptomatic since his bypass surgery 2 years ago in 11/2014. He has no other major risk factors. He does not exercise regularly but performs activity such as multiple flights of stairs without dyspnea or resting. Revised cardiac risk  index places him at less than 1% risk of perioperative complication. He is entirely asymptomatic throughout this admission. -No additional workup indicated -ASA 81mg , BB, ACEi should be fine perioperatively if otherwise appropriate he is at low risk for his lap chole  2. Essential hypertension:  Mildly elevated. -Could resume home lisinopril for mildly high BPs and no renal insufficiency  3. Hyperlipidemia: -Continue atorvastatin  4. Morbid obesity: Significant weight regained after lap banding -Needs to keep working on diet outpatient   He is low risk and having no active problems. He is at a low perioperative cardiac risk for this procedure. We may stop following routinely, please call with any new questions or if condition changes. Will sign off. Call for questions     Kristeen MissPhilip Kally Cadden, MD  12/31/2016 11:07 AM    [facility] [address],  [address] [place], [place]  [address] Pager [phone] Phone: [phone]; Fax: [phone]) [phone]

## 2016-12-31 NOTE — Interval H&P Note (Signed)
History and Physical Interval Note:  12/31/2016 3:15 PM  Hunter Gomez  has presented today for surgery, with the diagnosis of Gallstone Pancreatitis  The various methods of treatment have been discussed with the patient and family. After consideration of risks, benefits and other options for treatment, the patient has consented to  Procedure(s): LAPAROSCOPIC CHOLECYSTECTOMY WITH INTRAOPERATIVE CHOLANGIOGRAM (N/A) as a surgical intervention .  The patient's history has been reviewed, patient examined, no change in status, stable for surgery.  I have reviewed the patient's chart and labs.  Questions were answered to the patient's satisfaction.   The procedure has been discussed with the patient. Operative and non operative treatments have been discussed. Risks of surgery include bleeding, infection,  Common bile duct injury,  Injury to the stomach,liver, colon,small intestine, abdominal wall,  Diaphragm,  Major blood vessels,  And the need for an open procedure.  Other risks include worsening of medical problems, death,  DVT and pulmonary embolism, and cardiovascular events.   Medical options have also been discussed. The patient has been informed of long term expectations of surgery and non surgical options,  The patient agrees to proceed.     Arian Murley A.

## 2017-01-01 ENCOUNTER — Encounter (HOSPITAL_COMMUNITY): Payer: Self-pay | Admitting: Surgery

## 2017-01-01 MED ORDER — OXYCODONE-ACETAMINOPHEN 5-325 MG PO TABS: 1.0000 | ORAL_TABLET | ORAL | Status: DC | PRN

## 2017-01-01 NOTE — Discharge Instructions (Signed)
[facility] ______[facility] °LAPAROSCOPIC SURGERY: POST OP INSTRUCTIONS °Always review your discharge instruction sheet given to you by the facility where your surgery was performed. °IF YOU HAVE DISABILITY OR FAMILY LEAVE FORMS, YOU MUST BRING THEM TO THE OFFICE FOR PROCESSING.   °DO NOT GIVE THEM TO YOUR DOCTOR. ° °1. A prescription for pain medication may be given to you upon discharge.  Take your pain medication as prescribed, if needed.  If narcotic pain medicine is not needed, then you may take acetaminophen (Tylenol) or ibuprofen (Advil) as needed. °2. Take your usually prescribed medications unless otherwise directed. °3. If you need a refill on your pain medication, please contact your pharmacy.  They will contact our office to request authorization. Prescriptions will not be filled after 5pm or on week-ends. °4. You should follow a light diet the first few days after arrival home, such as soup and crackers, etc.  Be sure to include lots of fluids daily. °5. Most patients will experience some swelling and bruising in the area of the incisions.  Ice packs will help.  Swelling and bruising can take several days to resolve.  °6. It is common to experience some constipation if taking pain medication after surgery.  Increasing fluid intake and taking a stool softener (such as Colace) will usually help or prevent this problem from occurring.  A mild laxative (Milk of Magnesia or Miralax) should be taken according to package instructions if there are no bowel movements after 48 hours. °7. Unless discharge instructions indicate otherwise, you may remove your bandages 24-48 hours after surgery, and you may shower at that time.  You may have steri-strips (small skin tapes) in place directly over the incision.  These strips should be left on the skin for 7-10 days.  If your surgeon used skin glue on the incision, you may shower in 24 hours.  The glue will flake off over the next 2-3 weeks.  Any sutures or  staples will be removed at the office during your follow-up visit. °8. ACTIVITIES:  You may resume regular (light) daily activities beginning the next day--such as daily self-care, walking, climbing stairs--gradually increasing activities as tolerated.  You may have sexual intercourse when it is comfortable.  Refrain from any heavy lifting or straining until approved by your doctor. °a. You may drive when you are no longer taking prescription pain medication, you can comfortably wear a seatbelt, and you can safely maneuver your car and apply brakes. °b. RETURN TO WORK:  __________________________________________________________ °9. You should see your doctor in the office for a follow-up appointment approximately 2-3 weeks after your surgery.  Make sure that you call for this appointment within a day or two after you arrive home to insure a convenient appointment time. °10. OTHER INSTRUCTIONS: __________________________________________________________________________________________________________________________ __________________________________________________________________________________________________________________________ °WHEN TO CALL YOUR DOCTOR: °1. Fever over 101.0 °2. Inability to urinate °3. Continued bleeding from incision. °4. Increased pain, redness, or drainage from the incision. °5. Increasing abdominal pain ° °The clinic staff is available to answer your questions during regular business hours.  Please don’t hesitate to call and ask to speak to one of the nurses for clinical concerns.  If you have a medical emergency, go to the nearest emergency room or call 911.  A surgeon from [facility] is always on call at the hospital. °[address], [address], [place], [place]  [address] ? [address] [address], [place],    [address] °[phone] ? [phone] ? FAX [phone] °Web site:   [email or web address] Endoscopic Retrograde Cholangiopancreatography (ERCP), Care  After Refer to this sheet in the next few weeks. These instructions provide you with information on caring for yourself after your procedure. Your health care provider may also give you more specific instructions. Your treatment has been planned according to current medical practices, but problems sometimes occur. Call your health care provider if you have any problems or questions after your procedure.  WHAT TO EXPECT AFTER THE PROCEDURE  After your procedure, it is typical to feel:   Soreness in your throat.   Sick to your stomach (nauseous).   Bloated.  Dizzy.   Fatigued. HOME CARE INSTRUCTIONS  Have a friend or family member stay with you for the first 24 hours after your procedure.  Start taking your usual medicines and eating normally as soon as you feel well enough to do so or as directed by your health care provider. SEEK MEDICAL CARE IF:  You have abdominal pain.   You develop signs of infection, such as:   Chills.   Feeling unwell.  SEEK IMMEDIATE MEDICAL CARE IF:  You have difficulty swallowing.  You have worsening throat, chest, or abdominal pain.  You vomit.  You have bloody or very black stools.  You have a fever. This information is not intended to replace advice given to you by your health care provider. Make sure you discuss any questions you have with your health care provider. Document Released: 08/31/2013 Document Reviewed: 08/31/2013 Elsevier Interactive Patient Education  2017 [employer] Inc[employer]    Laparoscopic Cholecystectomy, Care After This sheet gives you information about how to care for yourself after your procedure. Your health care provider may also give you more specific instructions. If you have problems or questions, contact your health care provider. What can I expect after the procedure? After the procedure, it is common to have:  Pain at your incision sites. You will be given medicines to control this pain.  Mild nausea or  vomiting.  Bloating and possible shoulder pain from the air-like gas that was used during the procedure. Follow these instructions at home: Incision care  Follow instructions from your health care provider about how to take care of your incisions. Make sure you:  Wash your hands with soap and water before you change your bandage (dressing). If soap and water are not available, use hand sanitizer.  Change your dressing as told by your health care provider.  Leave stitches (sutures), skin glue, or adhesive strips in place. These skin closures may need to be in place for 2 weeks or longer. If adhesive strip edges start to loosen and curl up, you may trim the loose edges. Do not remove adhesive strips completely unless your health care provider tells you to do that.  Do not take baths, swim, or use a hot tub until your health care provider approves. Ask your health care provider if you can take showers. You may only be allowed to take sponge baths for bathing.  Check your incision area every day for signs of infection. Check for:  More redness, swelling, or pain.  More fluid or blood.  Warmth.  Pus or a bad smell. Activity  Do not drive or use heavy machinery while taking prescription pain medicine.  Do not lift anything that is heavier than 10 lb (4.5 kg) until your health care provider approves.  Do not play contact sports until your health care provider approves.  Do not drive for 24 hours if you were given a medicine  to help you relax (sedative).  Rest as needed. Do not return to work or school until your health care provider approves. General instructions  Take over-the-counter and prescription medicines only as told by your health care provider.  To prevent or treat constipation while you are taking prescription pain medicine, your health care provider may recommend that you:  Drink enough fluid to keep your urine clear or pale yellow.  Take over-the-counter or  prescription medicines.  Eat foods that are high in fiber, such as fresh fruits and vegetables, whole grains, and beans.  Limit foods that are high in fat and processed sugars, such as fried and sweet foods. Contact a health care provider if:  You develop a rash.  You have more redness, swelling, or pain around your incisions.  You have more fluid or blood coming from your incisions.  Your incisions feel warm to the touch.  You have pus or a bad smell coming from your incisions.  You have a fever.  One or more of your incisions breaks open. Get help right away if:  You have trouble breathing.  You have chest pain.  You have increasing pain in your shoulders.  You faint or feel dizzy when you stand.  You have severe pain in your abdomen.  You have nausea or vomiting that lasts for more than one day.  You have leg pain. This information is not intended to replace advice given to you by your health care provider. Make sure you discuss any questions you have with your health care provider. Document Released: 11/10/2005 Document Revised: 05/31/2016 Document Reviewed: 04/28/2016 Elsevier Interactive Patient Education  2017 [employer]

## 2017-01-01 NOTE — Progress Notes (Signed)
Patient verbalized understanding of discharge instructions. Patient provided with work excuse. Patient's wife downstairs to pick patient up. Patient taken downstairs by wheelchair by volunteer staff.

## 2017-01-01 NOTE — Discharge Summary (Signed)
PATIENT DETAILS Name: Hunter Gomez Age: 46 y.o. Sex: male Date of Birth: 1971-08-14 MRN: [id number]. Admitting Physician: Clydie Braun, MD [id number], DAVID Vincenza Hews, PA-C  Admit Date: 12/27/2016 Discharge date: 01/01/2017  Recommendations for Outpatient Follow-up:  1. Follow up with PCP in 1-2 weeks 2. Please obtain BMP/CBC along with LFTs in one week 3. Please ensure follow-up with general surgery  Admitted From:  Home   Disposition: Home   Home Health: No  Equipment/Devices: None  Discharge Condition: Stable  CODE STATUS: FULL CODE  Diet recommendation:  Heart Healthy  Brief Summary: See H&P, Labs, Consult and Test reports for all details in brief, Patient is a 46 y.o. male with history of CAD status post CABG, obesity status post lap band surgery admitted with gallstone pancreatitis, further workup demonstrated choledocholithiasis-patient underwent ERCP. subsequently underwent laparoscopic cholecystectomy 12/31/16. See below for further details  Brief Hospital Course: Biliary pancreatitis with choledocholithiasis: Pancreatitis has essentially resolved, abdomen is soft and nontender this morning. Underwent ERCP on 2/5 with biliary sphincterotomy and balloon sweeping. Subsequently seen by general surgery, and underwent laparoscopic cholecystectomy on 2/7. Postoperative course uncomplicated-by day of discharge tolerating a regular diet and passing flatus. Ablating in the room independently. LFTs slowly down trending-please recheck LFTs at next visit with PCP.  Hypertension: Controlled with metoprolol and lisinopril.  History of CAD status post CABG: Continue aspirin  Obesity: Status post lap band surgery, continue to encourage weight loss.  Procedures/Studies: ERCP 2/5>> Laparoscopic cholecystectomy 2/7>>  Discharge Diagnoses:  Principal Problem:   Choledocholithiasis Active Problems:   Essential hypertension, benign   Obesity   CAD (coronary artery  disease)   HLD (hyperlipidemia)   Leukocytosis   Cholelithiasis   Hepatic steatosis   Elevated transaminase level   Acute biliary pancreatitis   Jaundice   Discharge Instructions:  Activity:  As tolerated with Full fall precautions use walker/cane & assistance as needed  Discharge Instructions    Diet - low sodium heart healthy    Complete by:  As directed    Discharge instructions    Complete by:  As directed    Follow with Primary MD  Ernst Breach, PA-C  and and general surgery in the next 2 weeks.  Please get a complete blood count and chemistry panel checked by your Primary MD at your next visit, and again as instructed by your Primary MD.  Get Medicines reviewed and adjusted: Please take all your medications with you for your next visit with your Primary MD  Laboratory/radiological data: Please request your Primary MD to go over all hospital tests and procedure/radiological results at the follow up, please ask your Primary MD to get all Hospital records sent to his/her office.  In some cases, they will be blood work, cultures and biopsy results pending at the time of your discharge. Please request that your primary care M.D. follows up on these results.  Also Note the following: If you experience worsening of your admission symptoms, develop shortness of breath, life threatening emergency, suicidal or homicidal thoughts you must seek medical attention immediately by calling 911 or calling your MD immediately  if symptoms less severe.  You must read complete instructions/literature along with all the possible adverse reactions/side effects for all the Medicines you take and that have been prescribed to you. Take any new Medicines after you have completely understood and accpet all the possible adverse reactions/side effects.   Do not drive when taking Pain medications or sleeping medications (Benzodaizepines)  Do not  take more than prescribed Pain, Sleep and  Anxiety Medications. It is not advisable to combine anxiety,sleep and pain medications without talking with your primary care practitioner  Special Instructions: If you have smoked or chewed Tobacco  in the last 2 yrs please stop smoking, stop any regular Alcohol  and or any Recreational drug use.  Wear Seat belts while driving.  Please note: You were cared for by a hospitalist during your hospital stay. Once you are discharged, your primary care physician will handle any further medical issues. Please note that NO REFILLS for any discharge medications will be authorized once you are discharged, as it is imperative that you return to your primary care physician (or establish a relationship with a primary care physician if you do not have one) for your post hospital discharge needs so that they can reassess your need for medications and monitor your lab values.   Increase activity slowly    Complete by:  As directed      Allergies as of 01/01/2017   No Known Allergies     Medication List    TAKE these medications   aspirin 81 MG EC tablet Take 1 tablet (81 mg total) by mouth daily.   atorvastatin 80 MG tablet Commonly known as:  LIPITOR TAKE 1 TABLET(80 MG) BY MOUTH DAILY   lisinopril 10 MG tablet Commonly known as:  PRINIVIL,ZESTRIL TAKE 1 TABLET(10 MG) BY MOUTH DAILY   metoprolol 50 MG tablet Commonly known as:  LOPRESSOR TAKE 1 TABLET(50 MG) BY MOUTH TWICE DAILY   nitroGLYCERIN 0.4 MG SL tablet Commonly known as:  NITROSTAT Place 1 tablet (0.4 mg total) under the tongue every 5 (five) minutes as needed for chest pain.       No Known Allergies  Consultations:   GI and general surgery   Other Procedures/Studies: Dg Chest 2 View  Result Date: 12/27/2016 CLINICAL DATA:  Mid chest pain, epigastric pain. EXAM: CHEST  2 VIEW COMPARISON:  Radiographs 01/17/2015 FINDINGS: Patient is post median sternotomy. The heart is normal in size. Mediastinal contours are normal. No  pulmonary edema, pleural fluid or pneumothorax. No focal airspace disease. Minimal scarring at the left lung base. Gastric band in the upper abdomen. IMPRESSION: No acute abnormality. Electronically Signed   By: Rubye Oaks M.D.   On: 12/27/2016 02:44   Dg Ercp Biliary & Pancreatic Ducts  Result Date: 12/29/2016 CLINICAL DATA:  Common bile duct stones. EXAM: ERCP TECHNIQUE: Multiple spot images obtained with the fluoroscopic device and submitted for interpretation post-procedure. FLUOROSCOPY TIME:  Fluoroscopy Time:  3 minutes and 54 seconds Number of Acquired Spot Images: 3 COMPARISON:  Ultrasound 12/27/2016 FINDINGS: Wires advanced into the main pancreatic duct and the common bile duct. Contrast opacification of the extrahepatic biliary system. Cystic duct appears to be patent. Mild dilatation of the common bile duct. Question a small stone in the common bile duct. IMPRESSION: Cannulation and opacification of the biliary system for stone removal. These images were submitted for radiologic interpretation only. Please see the procedural report for the amount of contrast and the fluoroscopy time utilized. Electronically Signed   By: Richarda Overlie M.D.   On: 12/29/2016 14:12   US Abdomen Limited Ruq  Result Date: 12/27/2016 CLINICAL DATA:  Pancreatitis.  Abdominal pain for 1 day. EXAM: US ABDOMEN LIMITED - RIGHT UPPER QUADRANT COMPARISON:  None. FINDINGS: Gallbladder: Gallbladder is physiologically distended. Stones in intraluminal sludge within the gallbladder, including stones in the gallbladder neck. No gallbladder wall thickening. No  pericholecystic fluid. No sonographic Murphy sign noted by sonographer. Common bile duct: Diameter: 7 mm. Questionable 5 mm stone in the distal common bile duct. Liver: Increased and heterogeneous in echogenicity. Liver parenchyma is difficult to penetrate no evidence of focal lesion. Limited left lobe visualization. Normal directional flow in the main portal vein. IMPRESSION:  1. Sludge and stones in the gallbladder without sonographic findings of acute cholecystitis. 2. Prominent common bile duct for age measuring 7 mm, with a questionable 5 mm stone distally. 3. Hepatic steatosis. Electronically Signed   By: Rubye Oaks M.D.   On: 12/27/2016 04:40      TODAY-DAY OF DISCHARGE:  Subjective:   Hunter Gomez today has no headache,no chest abdominal pain,no new weakness tingling or numbness, feels much better wants to go home today.   Objective:   Blood pressure (!) 108/56, pulse 60, temperature 97.9 F (36.6 C), temperature source Oral, resp. rate 16, height 5\' 9"  (1.753 m), weight 115 kg (253 lb 8.5 oz), SpO2 95 %.  Intake/Output Summary (Last 24 hours) at 01/01/17 0927 Last data filed at 01/01/17 0900  Gross per 24 hour  Intake             3105 ml  Output               15 ml  Net             3090 ml   Filed Weights   12/30/16 0450 12/31/16 0639 12/31/16 2134  Weight: 110 kg (242 lb 8 oz) 106.3 kg (234 lb 5.6 oz) 115 kg (253 lb 8.5 oz)    Exam: Awake Alert, Oriented *3, No new F.N deficits, Normal affect [place].AT,PERRAL Supple Neck,No JVD, No cervical lymphadenopathy appriciated.  Symmetrical Chest wall movement, Good air movement bilaterally, CTAB RRR,No Gallops,Rubs or new Murmurs, No Parasternal Heave +ve B.Sounds, Abd Soft, Non tender, No organomegaly appriciated, No rebound -guarding or rigidity. No Cyanosis, Clubbing or edema, No new Rash or bruise   PERTINENT RADIOLOGIC STUDIES: Dg Chest 2 View  Result Date: 12/27/2016 CLINICAL DATA:  Mid chest pain, epigastric pain. EXAM: CHEST  2 VIEW COMPARISON:  Radiographs 01/17/2015 FINDINGS: Patient is post median sternotomy. The heart is normal in size. Mediastinal contours are normal. No pulmonary edema, pleural fluid or pneumothorax. No focal airspace disease. Minimal scarring at the left lung base. Gastric band in the upper abdomen. IMPRESSION: No acute abnormality. Electronically Signed   By:  Rubye Oaks M.D.   On: 12/27/2016 02:44   Dg Ercp Biliary & Pancreatic Ducts  Result Date: 12/29/2016 CLINICAL DATA:  Common bile duct stones. EXAM: ERCP TECHNIQUE: Multiple spot images obtained with the fluoroscopic device and submitted for interpretation post-procedure. FLUOROSCOPY TIME:  Fluoroscopy Time:  3 minutes and 54 seconds Number of Acquired Spot Images: 3 COMPARISON:  Ultrasound 12/27/2016 FINDINGS: Wires advanced into the main pancreatic duct and the common bile duct. Contrast opacification of the extrahepatic biliary system. Cystic duct appears to be patent. Mild dilatation of the common bile duct. Question a small stone in the common bile duct. IMPRESSION: Cannulation and opacification of the biliary system for stone removal. These images were submitted for radiologic interpretation only. Please see the procedural report for the amount of contrast and the fluoroscopy time utilized. Electronically Signed   By: Richarda Overlie M.D.   On: 12/29/2016 14:12   US Abdomen Limited Ruq  Result Date: 12/27/2016 CLINICAL DATA:  Pancreatitis.  Abdominal pain for 1 day. EXAM: US ABDOMEN LIMITED -  RIGHT UPPER QUADRANT COMPARISON:  None. FINDINGS: Gallbladder: Gallbladder is physiologically distended. Stones in intraluminal sludge within the gallbladder, including stones in the gallbladder neck. No gallbladder wall thickening. No pericholecystic fluid. No sonographic Murphy sign noted by sonographer. Common bile duct: Diameter: 7 mm. Questionable 5 mm stone in the distal common bile duct. Liver: Increased and heterogeneous in echogenicity. Liver parenchyma is difficult to penetrate no evidence of focal lesion. Limited left lobe visualization. Normal directional flow in the main portal vein. IMPRESSION: 1. Sludge and stones in the gallbladder without sonographic findings of acute cholecystitis. 2. Prominent common bile duct for age measuring 7 mm, with a questionable 5 mm stone distally. 3. Hepatic steatosis.  Electronically Signed   By: Rubye Oaks M.D.   On: 12/27/2016 04:40     PERTINENT LAB RESULTS: CBC:  Recent Labs  12/30/16 0502 12/31/16 0633  WBC 10.9* 11.5*  HGB 12.1* 13.0  HCT 35.9* 38.9*  PLT 216 285   CMET CMP     Component Value Date/Time   NA 139 12/31/2016 0633   K 3.6 12/31/2016 0633   CL 100 (L) 12/31/2016 0633   CO2 28 12/31/2016 0633   GLUCOSE 109 (H) 12/31/2016 0633   BUN 5 (L) 12/31/2016 0633   CREATININE 0.73 12/31/2016 0633   CREATININE 0.81 07/01/2016 1111   CALCIUM 8.9 12/31/2016 0633   PROT 6.8 12/31/2016 0633   ALBUMIN 3.2 (L) 12/31/2016 0633   AST 108 (H) 12/31/2016 0633   ALT 118 (H) 12/31/2016 0633   ALKPHOS 320 (H) 12/31/2016 0633   BILITOT 2.6 (H) 12/31/2016 0633   GFRNONAA >60 12/31/2016 0633   GFRAA >60 12/31/2016 0633    GFR Estimated Creatinine Clearance: 145.8 mL/min (by C-G formula based on SCr of 0.73 mg/dL).  Recent Labs  12/30/16 0502 12/31/16 0633  LIPASE 138* 132*   No results for input(s): CKTOTAL, CKMB, CKMBINDEX, TROPONINI in the last 72 hours. Invalid input(s): POCBNP No results for input(s): DDIMER in the last 72 hours. No results for input(s): HGBA1C in the last 72 hours. No results for input(s): CHOL, HDL, LDLCALC, TRIG, CHOLHDL, LDLDIRECT in the last 72 hours. No results for input(s): TSH, T4TOTAL, T3FREE, THYROIDAB in the last 72 hours.  Invalid input(s): FREET3 No results for input(s): VITAMINB12, FOLATE, FERRITIN, TIBC, IRON, RETICCTPCT in the last 72 hours. Coags: No results for input(s): INR in the last 72 hours.  Invalid input(s): PT Microbiology: Recent Results (from the past 240 hour(s))  Surgical pcr screen     Status: Abnormal   Collection Time: 12/30/16  6:18 PM  Result Value Ref Range Status   MRSA, PCR NEGATIVE NEGATIVE Final   Staphylococcus aureus POSITIVE (A) NEGATIVE Final    Comment:        The Xpert SA Assay (FDA approved for NASAL specimens in patients over 21 years of  age), is one component of a comprehensive surveillance program.  Test performance has been validated by [facility] for patients greater than or equal to 81 year old. It is not intended to diagnose infection nor to guide or monitor treatment.     FURTHER DISCHARGE INSTRUCTIONS:  Get Medicines reviewed and adjusted: Please take all your medications with you for your next visit with your Primary MD  Laboratory/radiological data: Please request your Primary MD to go over all hospital tests and procedure/radiological results at the follow up, please ask your Primary MD to get all Hospital records sent to his/her office.  In some cases, they will  be blood work, cultures and biopsy results pending at the time of your discharge. Please request that your primary care M.D. goes through all the records of your hospital data and follows up on these results.  Also Note the following: If you experience worsening of your admission symptoms, develop shortness of breath, life threatening emergency, suicidal or homicidal thoughts you must seek medical attention immediately by calling 911 or calling your MD immediately  if symptoms less severe.  You must read complete instructions/literature along with all the possible adverse reactions/side effects for all the Medicines you take and that have been prescribed to you. Take any new Medicines after you have completely understood and accpet all the possible adverse reactions/side effects.   Do not drive when taking Pain medications or sleeping medications (Benzodaizepines)  Do not take more than prescribed Pain, Sleep and Anxiety Medications. It is not advisable to combine anxiety,sleep and pain medications without talking with your primary care practitioner  Special Instructions: If you have smoked or chewed Tobacco  in the last 2 yrs please stop smoking, stop any regular Alcohol  and or any Recreational drug use.  Wear Seat belts while  driving.  Please note: You were cared for by a hospitalist during your hospital stay. Once you are discharged, your primary care physician will handle any further medical issues. Please note that NO REFILLS for any discharge medications will be authorized once you are discharged, as it is imperative that you return to your primary care physician (or establish a relationship with a primary care physician if you do not have one) for your post hospital discharge needs so that they can reassess your need for medications and monitor your lab values.  Total Time spent coordinating discharge including counseling, education and face to face time equals 45 minutes.  SignedJeoffrey Massed: Hunter Gomez 01/01/2017 9:27 AM

## 2017-01-01 NOTE — Anesthesia Postprocedure Evaluation (Addendum)
Anesthesia Post Note  Patient: Hunter Gomez  Procedure(s) Performed: Procedure(s) (LRB): LAPAROSCOPIC CHOLECYSTECTOMY (N/A)  Patient location during evaluation: PACU Anesthesia Type: General Level of consciousness: awake and alert Pain management: pain level controlled Vital Signs Assessment: post-procedure vital signs reviewed and stable Respiratory status: spontaneous breathing, nonlabored ventilation, respiratory function stable and patient connected to nasal cannula oxygen Cardiovascular status: blood pressure returned to baseline and stable Postop Assessment: no signs of nausea or vomiting Anesthetic complications: no       Last Vitals:  Vitals:   01/01/17 0415 01/01/17 0827  BP: 115/66 (!) 108/56  Pulse: (!) 53 60  Resp: 16   Temp: 36.6 C     Last Pain:  Vitals:   01/01/17 0415  TempSrc: Oral  PainSc:                  Shiniqua Groseclose

## 2017-01-01 NOTE — Progress Notes (Signed)
1 Day Post-Op  Subjective: He is doing well after cholecystectomy.  Anxious to go home.  Objective: Vital signs in last 24 hours: Temp:  [97.9 F (36.6 C)-98.2 F (36.8 C)] 97.9 F (36.6 C) (02/08 0415) Pulse Rate:  [53-73] 60 (02/08 0827) Resp:  [14-21] 16 (02/08 0415) BP: (108-151)/(56-97) 108/56 (02/08 0827) SpO2:  [94 %-100 %] 95 % (02/08 0415) Weight:  [115 kg (253 lb 8.5 oz)] 115 kg (253 lb 8.5 oz) (02/07 2134) Last BM Date: 12/31/16 240 PO this AM recorded 2865 IV yesterday recorded Afebrile, VSS Bilirubin improving Alk phos up  Intake/Output from previous day: 02/07 0701 - 02/08 0700 In: 2865 [I.V.:2865] Out: 15 [Blood:15] Intake/Output this shift: Total I/O In: 240 [P.O.:240] Out: -   General appearance: alert, cooperative and no distress GI: soft sore, site all look fine.  tolerating diet.  Lab Results:   Recent Labs  12/30/16 0502 12/31/16 0633  WBC 10.9* 11.5*  HGB 12.1* 13.0  HCT 35.9* 38.9*  PLT 216 285    BMET  Recent Labs  12/30/16 0502 12/31/16 0633  NA 137 139  K 3.5 3.6  CL 100* 100*  CO2 27 28  GLUCOSE 122* 109*  BUN 5* 5*  CREATININE 0.75 0.73  CALCIUM 8.5* 8.9   PT/INR No results for input(s): LABPROT, INR in the last 72 hours.   Recent Labs Lab 12/27/16 0048 12/28/16 0321 12/29/16 0438 12/30/16 0502 12/31/16 0633  AST 67* 41 44* 67* 108*  ALT 124* 83* 71* 79* 118*  ALKPHOS 171* 149* 142* 190* 320*  BILITOT 1.7* 5.7* 5.3* 4.6* 2.6*  PROT 7.0 7.2 6.4* 6.5 6.8  ALBUMIN 4.4 3.7 3.5 3.0* 3.2*     Lipase     Component Value Date/Time   LIPASE 132 (H) 12/31/2016 0786     Studies/Results: No results found.  Medications: . indomethacin  100 mg Rectal Once  . lisinopril  10 mg Oral Daily  . metoprolol  50 mg Oral BID  . sodium chloride flush  3 mL Intravenous Q12H     Assessment/Plan Cholelithiais S/p ERCP 12/29/16 Dr.Jacobs S/p laparoscopic cholecystectomy 12/31/16, Dr. Brantley Stage CAD, S/p  CABG Hypertension Body mass index is 37.4 FEN:  Regular diet LJ:QGBEE and Cipro 2/3-12/29/16  Cefazolin 2g pre op 1 dose. DVT:  SCD  Plan:  Home today, I am working on follow up with the clinic.  If he goes before I get the appointment our office will contact him.  Tylenol, ibuprofen or percocet for pain.        LOS: 5 days    Ikhlas Albo 01/01/2017 [id number]

## 2017-01-02 ENCOUNTER — Telehealth: Payer: Self-pay | Admitting: Medical

## 2017-01-02 NOTE — Telephone Encounter (Signed)
Pt returned your call. Please call back.

## 2017-01-02 NOTE — Telephone Encounter (Signed)
Left message/pt needs TOC/Hospital Follow Up appt

## 2017-01-05 NOTE — Telephone Encounter (Signed)
Called and left message reminding pt of appt on Thursday 01/08/2017

## 2017-01-08 ENCOUNTER — Ambulatory Visit (INDEPENDENT_AMBULATORY_CARE_PROVIDER_SITE_OTHER): Payer: BLUE CROSS/BLUE SHIELD | Admitting: Medical

## 2017-01-08 ENCOUNTER — Encounter: Payer: Self-pay | Admitting: Medical

## 2017-01-08 VITALS — BP 128/72 | HR 52 | Temp 98.3°F | Wt 235.0 lb

## 2017-01-08 DIAGNOSIS — I2510 Atherosclerotic heart disease of native coronary artery without angina pectoris: Secondary | ICD-10-CM | POA: Diagnosis not present

## 2017-01-08 DIAGNOSIS — I10 Essential (primary) hypertension: Secondary | ICD-10-CM

## 2017-01-08 DIAGNOSIS — Z951 Presence of aortocoronary bypass graft: Secondary | ICD-10-CM | POA: Diagnosis not present

## 2017-01-08 DIAGNOSIS — E785 Hyperlipidemia, unspecified: Secondary | ICD-10-CM

## 2017-01-08 DIAGNOSIS — K851 Biliary acute pancreatitis without necrosis or infection: Secondary | ICD-10-CM | POA: Diagnosis not present

## 2017-01-08 DIAGNOSIS — Z8249 Family history of ischemic heart disease and other diseases of the circulatory system: Secondary | ICD-10-CM

## 2017-01-08 DIAGNOSIS — K760 Fatty (change of) liver, not elsewhere classified: Secondary | ICD-10-CM

## 2017-01-08 DIAGNOSIS — K8070 Calculus of gallbladder and bile duct without cholecystitis without obstruction: Secondary | ICD-10-CM

## 2017-01-08 LAB — CBC
HEMATOCRIT: 39.8 % (ref 38.5–50.0)
HEMOGLOBIN: 13.2 g/dL (ref 13.2–17.1)
MCH: 27.7 pg (ref 27.0–33.0)
MCHC: 33.2 g/dL (ref 32.0–36.0)
MCV: 83.4 fL (ref 80.0–100.0)
MPV: 9.2 fL (ref 7.5–12.5)
Platelets: 562 10*3/uL — ABNORMAL HIGH (ref 140–400)
RBC: 4.77 MIL/uL (ref 4.20–5.80)
RDW: 13.8 % (ref 11.0–15.0)
WBC: 11.9 10*3/uL — ABNORMAL HIGH (ref 4.0–10.5)

## 2017-01-08 LAB — BASIC METABOLIC PANEL
BUN: 14 mg/dL (ref 7–25)
CALCIUM: 9.2 mg/dL (ref 8.6–10.3)
CO2: 24 mmol/L (ref 20–31)
Chloride: 104 mmol/L (ref 98–110)
Creat: 0.69 mg/dL (ref 0.60–1.35)
GLUCOSE: 97 mg/dL (ref 65–99)
POTASSIUM: 4.6 mmol/L (ref 3.5–5.3)
Sodium: 140 mmol/L (ref 135–146)

## 2017-01-08 LAB — HEPATIC FUNCTION PANEL
ALT: 50 U/L — AB (ref 9–46)
AST: 35 U/L (ref 10–40)
Albumin: 4.3 g/dL (ref 3.6–5.1)
Alkaline Phosphatase: 255 U/L — ABNORMAL HIGH (ref 40–115)
BILIRUBIN INDIRECT: 0.6 mg/dL (ref 0.2–1.2)
BILIRUBIN TOTAL: 0.9 mg/dL (ref 0.2–1.2)
Bilirubin, Direct: 0.3 mg/dL — ABNORMAL HIGH (ref <=0.2)
Total Protein: 7.3 g/dL (ref 6.1–8.1)

## 2017-01-08 NOTE — Progress Notes (Signed)
Subjective: Chief Complaint  Patient presents with  . hospital follow up    hospital follow up gall baldder removal on wed. , lab work   Was hospitalized 12/27/16 - 01/01/17 for biliary pancreatitis with choledocholithiasis.   Has his gall bladder removed and pancreatitis improved.  Went in with bloating, gas, some abodminal discomfort, liver and pancreas labs were elevated.    He feels improving now just sore in abdomen from surgery.   Still has surgery f/u.    Last visit here was 2016 for BP f/u.  He notes he ended up having 5 vessel CABG after last visit here.  Been seeing cardiology regularly, just hasn't made it back here.    Is trying to eat healthy, limits fried and processed food.  Most meals are at home, wife cooks.  He does have 1 hour commute daily.  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    Has always struggled with obesity, has hx/o gastric bypass.   Past Medical History:  Diagnosis Date  . CAD (coronary artery disease)    a.  NSTEMI (1/16):  LHC - dLM 95, oLAD 90, mLAD 30, oCFX 80-90, pCFX 50, oPDA 80, oPLA 75, EF 55-65% >> urgent CABG  . Hepatic steatosis   . HLD (hyperlipidemia)   . Hx of echocardiogram    Echo (2/16):  Mild LVH, EF 55-60%, Gr 2 DD, trivial AI/TR, no effusion  . Hypertension 10/2012  . Muscle weakness 1997   prior seizure like activity, but none since; prior neurology eval, prior normal EEG 1997  . Obesity    s/p Lap Band surgery  . Vision problem    prior use of glasses, not currently   Current Outpatient Prescriptions on File Prior to Visit  Medication Sig Dispense Refill  . aspirin 81 MG EC tablet Take 1 tablet (81 mg total) by mouth daily. (Patient taking differently: Take 325 mg by mouth daily. )    . atorvastatin (LIPITOR) 80 MG tablet TAKE 1 TABLET(80 MG) BY MOUTH DAILY 30 tablet 8  . lisinopril (PRINIVIL,ZESTRIL) 10 MG tablet TAKE 1 TABLET(10 MG) BY MOUTH DAILY 30 tablet 9  . metoprolol (LOPRESSOR) 50 MG tablet TAKE 1  TABLET(50 MG) BY MOUTH TWICE DAILY 60 tablet 9  . nitroGLYCERIN (NITROSTAT) 0.4 MG SL tablet Place 1 tablet (0.4 mg total) under the tongue every 5 (five) minutes as needed for chest pain. 25 tablet 11   No current facility-administered medications on file prior to visit.    Past Surgical History:  Procedure Laterality Date  . CHOLECYSTECTOMY N/A 12/31/2016   Procedure: LAPAROSCOPIC CHOLECYSTECTOMY;  Surgeon: Harriette Bouillon, MD;  Location: [facility] OR;  Service: General;  Laterality: N/A;  . CORONARY ARTERY BYPASS GRAFT N/A 12/18/2014   Procedure: CORONARY ARTERY BYPASS GRAFTING (CABG), ON PUMP, TIMES FIVE, USING BILATERAL INTERNAL MAMMARY ARTERIES, RIGHT GREATER SAPHENOUS VEIN HARVESTED ENDOSCOPICALLY;  Surgeon: Kerin Perna, MD;  Location: [facility] OR;  Service: Open Heart Surgery;  Laterality: N/A;  LIMA-LAD, SVG-D, SVG-OM, Free RIMA-Ramus, SVG-PD  . ENDOSCOPIC RETROGRADE CHOLANGIOPANCREATOGRAPHY (ERCP) WITH PROPOFOL N/A 12/29/2016   Procedure: ENDOSCOPIC RETROGRADE CHOLANGIOPANCREATOGRAPHY (ERCP) WITH PROPOFOL;  Surgeon: Rachael Fee, MD;  Location: [facility] ENDOSCOPY;  Service: Endoscopy;  Laterality: N/A;  . INTRAOPERATIVE TRANSESOPHAGEAL ECHOCARDIOGRAM N/A 12/18/2014   Procedure: INTRAOPERATIVE TRANSESOPHAGEAL ECHOCARDIOGRAM;  Surgeon: Kerin Perna, MD;  Location: [facility] OR;  Service: Open Heart Surgery;  Laterality: N/A;  . LAPAROSCOPIC GASTRIC BANDING  2007   initial procedure Dr. Robb Matar  [place], [place]  . LAPAROSCOPIC GASTRIC BANDING  2006  . LEFT HEART CATHETERIZATION WITH CORONARY ANGIOGRAM N/A 12/18/2014   Procedure: LEFT HEART CATHETERIZATION WITH CORONARY ANGIOGRAM;  Surgeon: Micheline ChapmanMichael D Cooper, MD;  [facility];  Service: Cardiovascular;  Laterality: N/A;  . TONSILLECTOMY    . WISDOM TOOTH EXTRACTION     ROS as in subjective   Objective: BP 128/72   Pulse (!) 52   Temp 98.3 F (36.8 C)   Wt 235 lb (106.6 kg)   SpO2 96%   BMI 34.70 kg/m   General  appearance: alert, no distress, WD/WN, obese white male Neck: supple, no lymphadenopathy, no thyromegaly, no masses, no JVD, no bruits Heart: RRR, normal S1, S2, no murmurs Lungs: CTA bilaterally, no wheezes, rhonchi, or rales Abdomen: +bs, soft, upper old surgical port scars, several new surgical port scars, scabs present, healing appropriately, otherwise non tender, non distended, no masses, no hepatomegaly, no splenomegaly Pulses: 2+ symmetric, upper and lower extremities, normal cap refill Ext: no edema   Assessment; Encounter Diagnoses  Name Primary?  . Acute biliary pancreatitis without infection or necrosis Yes  . Calculus of gallbladder and bile duct without cholecystitis or obstruction   . Coronary artery disease involving native coronary artery of native heart without angina pectoris   . Essential hypertension, benign   . Hepatic steatosis   . S/P CABG x 5   . Hyperlipidemia, unspecified hyperlipidemia type   . Family history of heart disease   . Family history of peripheral arterial disease     Plan: Reviewed the recent hospitalization records, labs, imaging, also reviewed 09/2015 hospital records from the CABG.   We spent a bit of time today discussing diet, need for regular heart healthy exercise once he has been released from surgery.  He is improved from gall bladder surgery.    Advised he check insurance for carotid artery US screen given his own CAD, family hx/o PAD.  No bruit, no symptoms, just as a precautionary screen  Labs today, c/t same medication, f/u pending labs, f/u with general surgery

## 2017-01-16 ENCOUNTER — Encounter: Payer: Self-pay | Admitting: Internal Medicine

## 2017-03-18 DIAGNOSIS — Z4651 Encounter for fitting and adjustment of gastric lap band: Secondary | ICD-10-CM | POA: Diagnosis not present

## 2017-04-24 NOTE — Addendum Note (Signed)
Addendum  created 04/24/17 1038 by Sharee HolsterMassagee, Romualdo Prosise, MD   Sign clinical note

## 2017-04-27 NOTE — Addendum Note (Signed)
Addendum  created 04/27/17 1111 by Oluwadarasimi Favor, MD   Sign clinical note    

## 2017-05-18 ENCOUNTER — Encounter (HOSPITAL_COMMUNITY): Payer: Self-pay

## 2017-05-18 DIAGNOSIS — Z950 Presence of cardiac pacemaker: Secondary | ICD-10-CM | POA: Insufficient documentation

## 2017-05-18 DIAGNOSIS — I2510 Atherosclerotic heart disease of native coronary artery without angina pectoris: Secondary | ICD-10-CM | POA: Diagnosis not present

## 2017-05-18 DIAGNOSIS — I10 Essential (primary) hypertension: Secondary | ICD-10-CM | POA: Insufficient documentation

## 2017-05-18 DIAGNOSIS — N200 Calculus of kidney: Secondary | ICD-10-CM | POA: Diagnosis not present

## 2017-05-18 DIAGNOSIS — R109 Unspecified abdominal pain: Secondary | ICD-10-CM | POA: Diagnosis not present

## 2017-05-18 DIAGNOSIS — R1111 Vomiting without nausea: Secondary | ICD-10-CM | POA: Diagnosis not present

## 2017-05-18 DIAGNOSIS — Z7982 Long term (current) use of aspirin: Secondary | ICD-10-CM | POA: Insufficient documentation

## 2017-05-18 DIAGNOSIS — I252 Old myocardial infarction: Secondary | ICD-10-CM | POA: Diagnosis not present

## 2017-05-18 DIAGNOSIS — R001 Bradycardia, unspecified: Secondary | ICD-10-CM | POA: Diagnosis not present

## 2017-05-18 LAB — COMPREHENSIVE METABOLIC PANEL
ALBUMIN: 4.6 g/dL (ref 3.5–5.0)
ALK PHOS: 118 U/L (ref 38–126)
ALT: 26 U/L (ref 17–63)
AST: 25 U/L (ref 15–41)
Anion gap: 10 (ref 5–15)
BILIRUBIN TOTAL: 1.2 mg/dL (ref 0.3–1.2)
BUN: 14 mg/dL (ref 6–20)
CO2: 24 mmol/L (ref 22–32)
CREATININE: 1 mg/dL (ref 0.61–1.24)
Calcium: 9.5 mg/dL (ref 8.9–10.3)
Chloride: 103 mmol/L (ref 101–111)
GFR calc Af Amer: >60 mL/min (ref >60)
GFR calc non Af Amer: >60 mL/min (ref >60)
GLUCOSE: 144 mg/dL — AB (ref 65–99)
Potassium: 3.9 mmol/L (ref 3.5–5.1)
SODIUM: 137 mmol/L (ref 135–145)
TOTAL PROTEIN: 7.3 g/dL (ref 6.5–8.1)

## 2017-05-18 LAB — CBC
HEMATOCRIT: 42 % (ref 39.0–52.0)
HEMOGLOBIN: 14 g/dL (ref 13.0–17.0)
MCH: 27.7 pg (ref 26.0–34.0)
MCHC: 33.3 g/dL (ref 30.0–36.0)
MCV: 83 fL (ref 78.0–100.0)
Platelets: 299 10*3/uL (ref 150–400)
RBC: 5.06 MIL/uL (ref 4.22–5.81)
RDW: 13.2 % (ref 11.5–15.5)
WBC: 11.7 10*3/uL — ABNORMAL HIGH (ref 4.0–10.5)

## 2017-05-18 MED ORDER — OXYCODONE-ACETAMINOPHEN 5-325 MG PO TABS
ORAL_TABLET | ORAL | Status: AC
  Filled 2017-05-18: qty 1

## 2017-05-18 MED ORDER — ONDANSETRON 4 MG PO TBDP
ORAL_TABLET | ORAL | Status: AC
  Filled 2017-05-18: qty 1

## 2017-05-18 MED ORDER — ONDANSETRON 4 MG PO TBDP
4.0000 mg | ORAL_TABLET | Freq: Once | ORAL | Status: AC
  Administered 2017-05-18: 4 mg via ORAL

## 2017-05-18 MED ORDER — OXYCODONE-ACETAMINOPHEN 5-325 MG PO TABS
1.0000 | ORAL_TABLET | Freq: Once | ORAL | Status: AC
  Administered 2017-05-18: 1 via ORAL

## 2017-05-18 NOTE — ED Notes (Signed)
Pt reports 1 episode of emesis in restroom after receiving percocet and zofran

## 2017-05-18 NOTE — ED Triage Notes (Signed)
Pt states he was at home around 6:15 when he had sudden onset of left flank and back pain. Denies N/V, or trouble urinating. Denies injury

## 2017-05-19 ENCOUNTER — Emergency Department (HOSPITAL_COMMUNITY)
Admission: EM | Admit: 2017-05-19 | Discharge: 2017-05-19 | Disposition: A | Discharge status: Home / Self Care | Payer: BLUE CROSS/BLUE SHIELD | Attending: Emergency Medicine | Admitting: Emergency Medicine

## 2017-05-19 ENCOUNTER — Emergency Department (HOSPITAL_COMMUNITY): Payer: BLUE CROSS/BLUE SHIELD

## 2017-05-19 DIAGNOSIS — R109 Unspecified abdominal pain: Secondary | ICD-10-CM | POA: Diagnosis not present

## 2017-05-19 DIAGNOSIS — N200 Calculus of kidney: Secondary | ICD-10-CM

## 2017-05-19 DIAGNOSIS — R1111 Vomiting without nausea: Secondary | ICD-10-CM | POA: Diagnosis not present

## 2017-05-19 LAB — URINALYSIS, ROUTINE W REFLEX MICROSCOPIC
BACTERIA UA: NONE SEEN
BILIRUBIN URINE: NEGATIVE
Glucose, UA: NEGATIVE mg/dL
KETONES UR: NEGATIVE mg/dL
Leukocytes, UA: NEGATIVE
NITRITE: NEGATIVE
PROTEIN: 100 mg/dL — AB
Specific Gravity, Urine: 1.027 (ref 1.005–1.030)
pH: 5 (ref 5.0–8.0)

## 2017-05-19 LAB — I-STAT CHEM 8, ED
BUN: 17 mg/dL (ref 6–20)
CREATININE: 0.8 mg/dL (ref 0.61–1.24)
Calcium, Ion: 1.21 mmol/L (ref 1.15–1.40)
Chloride: 101 mmol/L (ref 101–111)
Glucose, Bld: 116 mg/dL — ABNORMAL HIGH (ref 65–99)
HEMATOCRIT: 41 % (ref 39.0–52.0)
HEMOGLOBIN: 13.9 g/dL (ref 13.0–17.0)
POTASSIUM: 4.4 mmol/L (ref 3.5–5.1)
Sodium: 140 mmol/L (ref 135–145)
TCO2: 28 mmol/L (ref 0–100)

## 2017-05-19 MED ORDER — OXYCODONE-ACETAMINOPHEN 5-325 MG PO TABS: 1.0000 | ORAL_TABLET | Freq: Four times a day (QID) | ORAL | 0 refills | Status: DC | PRN

## 2017-05-19 MED ORDER — SODIUM CHLORIDE 0.9 % IV BOLUS (SEPSIS)
1000.0000 mL | Freq: Once | INTRAVENOUS | Status: AC
  Administered 2017-05-19: 1000 mL via INTRAVENOUS

## 2017-05-19 MED ORDER — ONDANSETRON 4 MG PO TBDP: 4.0000 mg | ORAL_TABLET | Freq: Three times a day (TID) | ORAL | 0 refills | Status: DC | PRN

## 2017-05-19 MED ORDER — KETOROLAC TROMETHAMINE 15 MG/ML IJ SOLN
15.0000 mg | Freq: Once | INTRAMUSCULAR | Status: AC
  Administered 2017-05-19: 15 mg via INTRAVENOUS
  Filled 2017-05-19: qty 1

## 2017-05-19 NOTE — Discharge Instructions (Signed)
You have a 3 mm kidney stone on the left side. This will likely pass on its own. Make sure to drink lots of water. You'll be given a short course of pain and nausea medication. If you develop fevers or any new or worsening symptoms she should be reevaluated.

## 2017-05-19 NOTE — ED Provider Notes (Signed)
[facility]-EMERGENCY DEPT Provider Note   CSN: [id number] Arrival date & time: 05/18/17  2012  By signing my name below, I, Diona BrownerJennifer Gorman, attest that this documentation has been prepared under the direction and in the presence of Zorina Mallin, Mayer Maskerourtney F, MD. Electronically Signed: Diona BrownerJennifer Gorman, ED Scribe. 05/19/17. 3:42 AM.  History   Chief Complaint Chief Complaint  Patient presents with  . Flank Pain    HPI Hunter Gomez is a 46 y.o. male with a PMHx of CAD and cholecystectomy who presents to the Emergency Department complaining of sudden, sharp, intermittent, left flank pain that started ~ 6:15 pm 05/18/17. Associated sx include back pain, nausea, and vomiting. Pain radiated to his abdomen. He currently rates his pain a 2/10 severity after receiving percocet in triage. Pain is similar to when he had pancreatis. No recent injuries. No hx of kidney stones. Doesn't smoke or drink. Pt denies urgency, frequency, hematuria, dysuria, difficulty urinating, fever and cough.  The history is provided by the patient. No language interpreter was used.    Past Medical History:  Diagnosis Date  . CAD (coronary artery disease)    a.  NSTEMI (1/16):  LHC - dLM 95, oLAD 90, mLAD 30, oCFX 80-90, pCFX 50, oPDA 80, oPLA 75, EF 55-65% >> urgent CABG  . Hepatic steatosis   . HLD (hyperlipidemia)   . Hx of echocardiogram    Echo (2/16):  Mild LVH, EF 55-60%, Gr 2 DD, trivial AI/TR, no effusion  . Hypertension 10/2012  . Muscle weakness 1997   prior seizure like activity, but none since; prior neurology eval, prior normal EEG 1997  . Obesity    s/p Lap Band surgery  . Vision problem    prior use of glasses, not currently    Patient Active Problem List   Diagnosis Date Noted  . Family history of peripheral arterial disease 01/08/2017  . Jaundice   . Choledocholithiasis 12/27/2016  . Leukocytosis 12/27/2016  . Cholelithiasis 12/27/2016  . Elevated transaminase level 12/27/2016  . Acute biliary  pancreatitis 12/27/2016  . Hepatic steatosis   . HLD (hyperlipidemia) 01/08/2015  . CAD (coronary artery disease)   . 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 NSTEMI (non-ST elevated myocardial infarction) (HCC) 12/15/2014  . Essential hypertension, benign 03/16/2013  . Obesity 03/16/2013    Past Surgical History:  Procedure Laterality Date  . CHOLECYSTECTOMY N/A 12/31/2016   Procedure: LAPAROSCOPIC CHOLECYSTECTOMY;  Surgeon: Harriette Bouillonhomas Cornett, MD;  Location: [facility] OR;  Service: General;  Laterality: N/A;  . CORONARY ARTERY BYPASS GRAFT N/A 12/18/2014   Procedure: CORONARY ARTERY BYPASS GRAFTING (CABG), ON PUMP, TIMES FIVE, USING BILATERAL INTERNAL MAMMARY ARTERIES, RIGHT GREATER SAPHENOUS VEIN HARVESTED ENDOSCOPICALLY;  Surgeon: Kerin PernaPeter Van Trigt, MD;  [facility];  Service: Open Heart Surgery;  Laterality: N/A;  LIMA-LAD, SVG-D, SVG-OM, Free RIMA-Ramus, SVG-PD  . ENDOSCOPIC RETROGRADE CHOLANGIOPANCREATOGRAPHY (ERCP) WITH PROPOFOL N/A 12/29/2016   Procedure: ENDOSCOPIC RETROGRADE CHOLANGIOPANCREATOGRAPHY (ERCP) WITH PROPOFOL;  Surgeon: Rachael Feeaniel P Jacobs, MD;  [facility];  Service: Endoscopy;  Laterality: N/A;  . INTRAOPERATIVE TRANSESOPHAGEAL ECHOCARDIOGRAM N/A 12/18/2014   Procedure: INTRAOPERATIVE TRANSESOPHAGEAL ECHOCARDIOGRAM;  Surgeon: Kerin PernaPeter Van Trigt, MD;  [facility];  Service: Open Heart Surgery;  Laterality: N/A;  . LAPAROSCOPIC GASTRIC BANDING  2007   initial procedure Dr. Robb [place]arrtiz in [place], [place]  . LAPAROSCOPIC GASTRIC BANDING  2006  . LEFT HEART CATHETERIZATION WITH CORONARY ANGIOGRAM N/A 12/18/2014   Procedure: LEFT HEART CATHETERIZATION WITH CORONARY ANGIOGRAM;  Surgeon: Veverly FellsMichael D  Excell Seltzer, MD;  Location: [facility] CATH LAB;  Service: Cardiovascular;  Laterality: N/A;  . TONSILLECTOMY    . WISDOM TOOTH EXTRACTION         Home Medications    Prior to Admission medications   Medication Sig Start Date End Date Taking? Authorizing Provider  aspirin 81 MG EC  tablet Take 1 tablet (81 mg total) by mouth daily. Patient taking differently: Take 325 mg by mouth daily.  09/19/15   [name] T, PA-C  atorvastatin (LIPITOR) 80 MG tablet TAKE 1 TABLET(80 MG) BY MOUTH DAILY 10/14/16   [name] T, PA-C  lisinopril (PRINIVIL,ZESTRIL) 10 MG tablet TAKE 1 TABLET(10 MG) BY MOUTH DAILY 09/22/16   [name] T, PA-C  metoprolol (LOPRESSOR) 50 MG tablet TAKE 1 TABLET(50 MG) BY MOUTH TWICE DAILY 09/22/16   [name] T, PA-C  nitroGLYCERIN (NITROSTAT) 0.4 MG SL tablet Place 1 tablet (0.4 mg total) under the tongue every 5 (five) minutes as needed for chest pain. 09/19/15   [name] T, PA-C  ondansetron (ZOFRAN ODT) 4 MG disintegrating tablet Take 1 tablet (4 mg total) by mouth every 8 (eight) hours as needed for nausea or vomiting. 05/19/17   Cherly Erno, Mayer Masker, MD  oxyCODONE-acetaminophen (PERCOCET/ROXICET) 5-325 MG tablet Take 1-2 tablets by mouth every 6 (six) hours as needed for severe pain. 05/19/17   Eswin Worrell, Mayer Masker, MD    Family History Family History  Problem Relation Age of Onset  . Diabetes Mother   . Hypertension Mother   . Hyperlipidemia Mother   . Heart disease Mother 79       CABG  . Peripheral vascular disease Mother   . Hypertension Father   . Hyperlipidemia Father   . Heart disease Father 25       CABG  . Stroke Father   . Hypertension Brother   . Hyperlipidemia Brother   . Cancer Maternal Grandfather        stomach  . Cancer Paternal Grandmother   . Heart attack Neg Hx     Social History Social History  Substance Use Topics  . Smoking status: Never Smoker  . Smokeless tobacco: Never Used  . Alcohol use No     Allergies   Patient has no known allergies.   Review of Systems Review of Systems  Constitutional: Negative for fever.  Respiratory: Negative for cough.   Gastrointestinal: Positive for nausea and vomiting. Negative for abdominal pain.  Genitourinary: Positive for flank pain. Negative for  difficulty urinating, dysuria, frequency, hematuria and urgency.  Musculoskeletal: Positive for back pain.  All other systems reviewed and are negative.    Physical Exam Updated Vital Signs BP (!) 141/71 (BP Location: Left Arm)   Pulse (!) 49   Temp 98.3 F (36.8 C) (Oral)   Resp 19   Ht 5\' 8"  (1.727 m)   Wt 104.3 kg (230 lb)   SpO2 100%   BMI 34.97 kg/m   Physical Exam  Constitutional: He is oriented to person, place, and time. He appears well-developed and well-nourished. No distress.  HENT:  Head: Normocephalic and atraumatic.  Cardiovascular: Normal rate, regular rhythm and normal heart sounds.   No murmur heard. Pulmonary/Chest: Effort normal and breath sounds normal. No respiratory distress. He has no wheezes.  Abdominal: Soft. Bowel sounds are normal. There is no tenderness. There is no rebound.  Genitourinary:  Genitourinary Comments: No CVA tenderness  Musculoskeletal: He exhibits no edema.  Neurological: He is alert and oriented to person, place,  and time.  Skin: Skin is warm and dry.  Psychiatric: He has a normal mood and affect.  Nursing note and vitals reviewed.    ED Treatments / Results  DIAGNOSTIC STUDIES: Oxygen Saturation is 100% on RA, normal by my interpretation.   COORDINATION OF CARE: 3:42 AM-Discussed next steps with pt. Pt verbalized understanding and is agreeable with the plan.   Labs (all labs ordered are listed, but only abnormal results are displayed) Labs Reviewed  URINALYSIS, ROUTINE W REFLEX MICROSCOPIC - Abnormal; Notable for the following:       Result Value   Color, Urine AMBER (*)    APPearance CLOUDY (*)    Hgb urine dipstick LARGE (*)    Protein, ur 100 (*)    Squamous Epithelial / LPF 0-5 (*)    All other components within normal limits  CBC - Abnormal; Notable for the following:    WBC 11.7 (*)    All other components within normal limits  COMPREHENSIVE METABOLIC PANEL - Abnormal; Notable for the following:     Glucose, Bld 144 (*)    All other components within normal limits  I-STAT CHEM 8, ED - Abnormal; Notable for the following:    Glucose, Bld 116 (*)    All other components within normal limits    EKG  EKG Interpretation  Date/Time:  Monday May 18 2017 20:57:56 EDT Ventricular Rate:  46 PR Interval:  170 QRS Duration: 102 QT Interval:  448 QTC Calculation: 392 R Axis:   71 Text Interpretation:  Sinus bradycardia Otherwise normal ECG Confirmed by Ross Marcus ([id number]) on 05/19/2017 4:28:48 AM       Radiology Ct Renal Stone Study  Result Date: 05/19/2017 CLINICAL DATA:  Sudden onset left flank pain and vomiting tonight. EXAM: CT ABDOMEN AND PELVIS WITHOUT CONTRAST TECHNIQUE: Multidetector CT imaging of the abdomen and pelvis was performed following the standard protocol without IV contrast. COMPARISON:  None. FINDINGS: Lower chest: No acute abnormality. Hepatobiliary: No focal liver abnormality is seen. Status post cholecystectomy. No biliary dilatation. Pancreas: Unremarkable. No pancreatic ductal dilatation or surrounding inflammatory changes. Spleen: Normal in size without focal abnormality. Adrenals/Urinary Tract: Both adrenals are normal. The right kidney, collecting system and ureter are normal. There is a 3 mm proximal left ureteral calculus at the L2 level with moderate hydronephrosis. Urinary bladder is unremarkable Stomach/Bowel: Stomach is within normal limits. Appendix is normal. No evidence of bowel wall thickening, distention, or inflammatory changes. Vascular/Lymphatic: No significant vascular findings are present. No enlarged abdominal or pelvic lymph nodes. Reproductive: Unremarkable Other: Intact appearances of the lap band device. Musculoskeletal: No significant skeletal lesion. IMPRESSION: 3 mm proximal left ureteral calculus with moderate hydronephrosis. Electronically Signed   By: Ellery Plunk M.D.   On: 05/19/2017 05:15    Procedures Procedures (including  critical care time)  Medications Ordered in ED Medications  oxyCODONE-acetaminophen (PERCOCET/ROXICET) 5-325 MG per tablet 1 tablet (1 tablet Oral Given 05/18/17 2046)  ondansetron (ZOFRAN-ODT) disintegrating tablet 4 mg (4 mg Oral Given 05/18/17 2046)  sodium chloride 0.9 % bolus 1,000 mL (0 mLs Intravenous Stopped 05/19/17 0437)  ketorolac (TORADOL) 15 MG/ML injection 15 mg (15 mg Intravenous Given 05/19/17 0436)     Initial Impression / Assessment and Plan / ED Course  I have reviewed the triage vital signs and the nursing notes.  Pertinent labs & imaging results that were available during my care of the patient were reviewed by me and considered in my medical decision making (see chart  for details).     Patient presents with acute onset of left flank pain. He is nontoxic on exam. Pain improved to 2 out of 10 after Percocet. He is comfortable appearing. He does have hemoglobin in the urine. No history of kidney stones; however, story is suspicious. Will obtain a CT scan. Patient given Toradol. CT scan shows a 3 mm stone with mild Hydro. Patient remains comfortable. I discussed with him supportive measures including pain and nausea medication and expectant management. Patient stated understanding. He was given urology follow-up.  After history, exam, and medical workup I feel the patient has been appropriately medically screened and is safe for discharge home. Pertinent diagnoses were discussed with the patient. Patient was given return precautions.  Final Clinical Impressions(s) / ED Diagnoses   Final diagnoses:  Kidney stones    New Prescriptions New Prescriptions   ONDANSETRON (ZOFRAN ODT) 4 MG DISINTEGRATING TABLET    Take 1 tablet (4 mg total) by mouth every 8 (eight) hours as needed for nausea or vomiting.   OXYCODONE-ACETAMINOPHEN (PERCOCET/ROXICET) 5-325 MG TABLET    Take 1-2 tablets by mouth every 6 (six) hours as needed for severe pain.   I personally performed the services  described in this documentation, which was scribed in my presence. The recorded information has been reviewed and is accurate.     Shon Baton, MD 05/19/17 [id number]

## 2017-07-28 ENCOUNTER — Telehealth: Payer: Self-pay | Admitting: Cardiovascular Disease

## 2017-07-28 NOTE — Telephone Encounter (Signed)
New message       Pt has an appt with Dr Excell Seltzerooper on 08-24-17.  Calling to see if he needs labs drawn prior to appt. Please call

## 2017-07-28 NOTE — Telephone Encounter (Signed)
FLP was completed in February. CMET was completed in June.  Left message for patient that no labs should be needed prior to appointment. Instructed him to call with any other questions or concerns.

## 2017-08-12 ENCOUNTER — Other Ambulatory Visit: Payer: Self-pay | Admitting: Physician Assistant

## 2017-08-12 DIAGNOSIS — I2510 Atherosclerotic heart disease of native coronary artery without angina pectoris: Secondary | ICD-10-CM

## 2017-08-24 ENCOUNTER — Encounter: Payer: Self-pay | Admitting: Cardiovascular Disease

## 2017-08-24 ENCOUNTER — Ambulatory Visit (INDEPENDENT_AMBULATORY_CARE_PROVIDER_SITE_OTHER): Payer: BLUE CROSS/BLUE SHIELD | Admitting: Cardiovascular Disease

## 2017-08-24 DIAGNOSIS — I2510 Atherosclerotic heart disease of native coronary artery without angina pectoris: Secondary | ICD-10-CM | POA: Diagnosis not present

## 2017-08-24 DIAGNOSIS — Z951 Presence of aortocoronary bypass graft: Secondary | ICD-10-CM

## 2017-08-24 MED ORDER — ATORVASTATIN CALCIUM 80 MG PO TABS: ORAL_TABLET | ORAL | 11 refills | Status: DC

## 2017-08-24 MED ORDER — LISINOPRIL 10 MG PO TABS: ORAL_TABLET | ORAL | 11 refills | Status: DC

## 2017-08-24 MED ORDER — METOPROLOL TARTRATE 50 MG PO TABS: ORAL_TABLET | ORAL | 11 refills | Status: DC

## 2017-08-24 MED ORDER — NITROGLYCERIN 0.4 MG SL SUBL: 0.4000 mg | SUBLINGUAL_TABLET | SUBLINGUAL | 3 refills | Status: DC | PRN

## 2017-08-24 NOTE — Progress Notes (Signed)
Cardiology Office Note Date:  08/25/2017   ID:  Hunter Gomez, DOB 11-Sep-1971, MRN [id number]  PCP:  Jac Canavan, PA-C  Cardiologist:  Tonny Bollman, MD    Chief Complaint  Patient presents with  . Follow-up     History of Present Illness: Hunter Gomez is a 46 y.o. male who presents for follow-up of CAD. The patient initially presented in 2016 with NSTEMI. He was found to have severe left main and multivessel CAD and was ultimately treated with multivessel CABG.   Since his last visit he's had a hospitalization for gallstone pancreatitis requiring ERCP and cholecystectomy. He's also been evaluated in the ER for kidney stones.   He complains of residual discomfort and band-like pain in his chest since his sternotomy. No exertional chest pain or pressure. Otherwise doing very well. No edema, palpitations, orthopnea, or PND.  Past Medical History:  Diagnosis Date  . CAD (coronary artery disease)    a.  NSTEMI (1/16):  LHC - dLM 95, oLAD 90, mLAD 30, oCFX 80-90, pCFX 50, oPDA 80, oPLA 75, EF 55-65% >> urgent CABG  . Hepatic steatosis   . HLD (hyperlipidemia)   . Hx of echocardiogram    Echo (2/16):  Mild LVH, EF 55-60%, Gr 2 DD, trivial AI/TR, no effusion  . Hypertension 10/2012  . Muscle weakness 1997   prior seizure like activity, but none since; prior neurology eval, prior normal EEG 1997  . Obesity    s/p Lap Band surgery  . Vision problem    prior use of glasses, not currently    Past Surgical History:  Procedure Laterality Date  . CHOLECYSTECTOMY N/A 12/31/2016   Procedure: LAPAROSCOPIC CHOLECYSTECTOMY;  Surgeon: Harriette Bouillon, MD;  Location: [facility] OR;  Service: General;  Laterality: N/A;  . CORONARY ARTERY BYPASS GRAFT N/A 12/18/2014   Procedure: CORONARY ARTERY BYPASS GRAFTING (CABG), ON PUMP, TIMES FIVE, USING BILATERAL INTERNAL MAMMARY ARTERIES, RIGHT GREATER SAPHENOUS VEIN HARVESTED ENDOSCOPICALLY;  Surgeon: Kerin Perna, MD;  Location[facility] OR;  Service: Open  Heart Surgery;  Laterality: N/A;  LIMA-LAD, SVG-D, SVG-OM, Free RIMA-Ramus, SVG-PD  . ENDOSCOPIC RETROGRADE CHOLANGIOPANCREATOGRAPHY (ERCP) WITH PROPOFOL N/A 12/29/2016   Procedure: ENDOSCOPIC RETROGRADE CHOLANGIOPANCREATOGRAPHY (ERCP) WITH PROPOFOL;  Surgeon: Rachael Fee, MD;  Location: [facility] ENDOSCOPY;  Service: Endoscopy;  Laterality: N/A;  . INTRAOPERATIVE TRANSESOPHAGEAL ECHOCARDIOGRAM N/A 12/18/2014   Procedure: INTRAOPERATIVE TRANSESOPHAGEAL ECHOCARDIOGRAM;  Surgeon: Kerin Perna, MD;  Location: [facility] OR;  Service: Open Heart Surgery;  Laterality: N/A;  . LAPAROSCOPIC GASTRIC BANDING  2007   initial procedure Dr. Robb Matar in [place], adjustments in [place], [place]  . LAPAROSCOPIC GASTRIC BANDING  2006  . LEFT HEART CATHETERIZATION WITH CORONARY ANGIOGRAM N/A 12/18/2014   Procedure: LEFT HEART CATHETERIZATION WITH CORONARY ANGIOGRAM;  Surgeon: Micheline Chapman, MD;  Location: [facility] CATH LAB;  Service: Cardiovascular;  Laterality: N/A;  . TONSILLECTOMY    . WISDOM TOOTH EXTRACTION      Current Outpatient Prescriptions  Medication Sig Dispense Refill  . aspirin 325 MG EC tablet Take 325 mg by mouth daily.    Marland Kitchen atorvastatin (LIPITOR) 80 MG tablet TAKE 1 TABLET(80 MG) BY MOUTH DAILY 30 tablet 11  . lisinopril (PRINIVIL,ZESTRIL) 10 MG tablet TAKE 1 TABLET(10 MG) BY MOUTH DAILY 30 tablet 11  . metoprolol tartrate (LOPRESSOR) 50 MG tablet TAKE 1 TABLET(50 MG) BY MOUTH TWICE DAILY 60 tablet 11  . nitroGLYCERIN (NITROSTAT) 0.4 MG SL tablet Place 1 tablet (0.4 mg total) under the tongue every  5 (five) minutes as needed for chest pain. 25 tablet 3  . ondansetron (ZOFRAN ODT) 4 MG disintegrating tablet Take 1 tablet (4 mg total) by mouth every 8 (eight) hours as needed for nausea or vomiting. 20 tablet 0  . oxyCODONE-acetaminophen (PERCOCET/ROXICET) 5-325 MG tablet Take 1-2 tablets by mouth every 6 (six) hours as needed for severe pain. 10 tablet 0   No current facility-administered medications for  this visit.     Allergies:   Patient has no known allergies.   Social History:  The patient  reports that he has never smoked. He has never used smokeless tobacco. He reports that he does not drink alcohol or use drugs.   Family History:  The patient's family history includes Cancer in his maternal grandfather and paternal grandmother; Diabetes in his mother; Heart disease (age of onset: 35) in his father; Heart disease (age of onset: 43) in his mother; Hyperlipidemia in his brother, father, and mother; Hypertension in his brother, father, and mother; Peripheral vascular disease in his mother; Stroke in his father.    ROS:  Please see the history of present illness. All other systems are reviewed and negative.    PHYSICAL EXAM: VS:  BP 130/80   Pulse (!) 50   Ht  (1.753 m)   Wt 103 kg (227 lb 1.9 oz)   BMI 33.54 kg/m  , BMI Body mass index is 33.54 kg/m. GEN: Well nourished, well developed, in no acute distress  HEENT: normal  Neck: no JVD, no masses. No carotid bruits Cardiac: RRR without murmur or gallop                Respiratory:  clear to auscultation bilaterally, normal work of breathing GI: soft, nontender, nondistended, + BS MS: no deformity or atrophy  Ext: no pretibial edema, pedal pulses 2+= bilaterally Skin: warm and dry, no rash Neuro:  Strength and sensation are intact Psych: euthymic mood, full affect  EKG:  EKG is not ordered today.  Recent Labs: 12/27/2016: TSH 2.415 12/31/2016: Magnesium 2.2 05/18/2017: ALT 26; Platelets 299 05/19/2017: BUN 17; Creatinine, Ser 0.80; Hemoglobin 13.9; Potassium 4.4; Sodium 140   Lipid Panel     Component Value Date/Time   CHOL 127 12/27/2016 0912   TRIG 157 (H) 12/27/2016 0912   HDL 35 (L) 12/27/2016 0912   CHOLHDL 3.6 12/27/2016 0912   VLDL 31 12/27/2016 0912   LDLCALC 61 12/27/2016 0912      Wt Readings from Last 3 Encounters:  08/24/17 103 kg (227 lb 1.9 oz)  05/18/17 104.3 kg (230 lb)  01/08/17 106.6 kg (235  lb)     ASSESSMENT AND PLAN: 1.  CAD, native vessel, without angina: continues to do well. Medications reviewed and no changes recommended today.   2. Essential HTN: BP controlled on current Rx. Continue lisinopril and metoprolol.   3. Hyperlipidemia: continue high-intensity statin with atorvastatin 80 mg daily. Lifestyle modification reviewed. Review of most recent labs shows LDL cholesterol 61 mg/dL.   Current medicines are reviewed with the patient today.  The patient does not have concerns regarding medicines.  Labs/ tests ordered today include:  No orders of the defined types were placed in this encounter.   Disposition:   FU one year  Signed, Tonny Bollman, MD  08/25/2017 5:17 PM    [facility] [address] [place], [place], [place]  [address] Phone: [phone]; Fax: [phone]

## 2017-08-24 NOTE — Patient Instructions (Signed)

## 2017-08-26 ENCOUNTER — Telehealth: Payer: Self-pay | Admitting: Medical

## 2017-08-26 NOTE — Telephone Encounter (Signed)
Pt is coming in he actually setup for a cpe oct 17th

## 2017-08-26 NOTE — Telephone Encounter (Signed)
Set him up for  27mo f/u visit.   Last labs in computer from June showed lab abnormalities.

## 2017-09-09 ENCOUNTER — Ambulatory Visit (INDEPENDENT_AMBULATORY_CARE_PROVIDER_SITE_OTHER): Payer: BLUE CROSS/BLUE SHIELD | Admitting: Medical

## 2017-09-09 ENCOUNTER — Encounter: Payer: Self-pay | Admitting: Medical

## 2017-09-09 VITALS — BP 132/80 | HR 54 | Ht 68.0 in | Wt 226.8 lb

## 2017-09-09 DIAGNOSIS — K760 Fatty (change of) liver, not elsewhere classified: Secondary | ICD-10-CM | POA: Diagnosis not present

## 2017-09-09 DIAGNOSIS — Z23 Encounter for immunization: Secondary | ICD-10-CM | POA: Diagnosis not present

## 2017-09-09 DIAGNOSIS — I10 Essential (primary) hypertension: Secondary | ICD-10-CM | POA: Diagnosis not present

## 2017-09-09 DIAGNOSIS — I2510 Atherosclerotic heart disease of native coronary artery without angina pectoris: Secondary | ICD-10-CM

## 2017-09-09 DIAGNOSIS — D72829 Elevated white blood cell count, unspecified: Secondary | ICD-10-CM | POA: Diagnosis not present

## 2017-09-09 DIAGNOSIS — E669 Obesity, unspecified: Secondary | ICD-10-CM

## 2017-09-09 DIAGNOSIS — E66811 Obesity, class 1: Secondary | ICD-10-CM

## 2017-09-09 DIAGNOSIS — Z8249 Family history of ischemic heart disease and other diseases of the circulatory system: Secondary | ICD-10-CM

## 2017-09-09 DIAGNOSIS — E785 Hyperlipidemia, unspecified: Secondary | ICD-10-CM | POA: Diagnosis not present

## 2017-09-09 DIAGNOSIS — Z0000 Encounter for general adult medical examination without abnormal findings: Secondary | ICD-10-CM

## 2017-09-09 DIAGNOSIS — I214 Non-ST elevation (NSTEMI) myocardial infarction: Secondary | ICD-10-CM

## 2017-09-09 DIAGNOSIS — N132 Hydronephrosis with renal and ureteral calculous obstruction: Secondary | ICD-10-CM

## 2017-09-09 DIAGNOSIS — Z951 Presence of aortocoronary bypass graft: Secondary | ICD-10-CM | POA: Diagnosis not present

## 2017-09-09 LAB — POCT URINALYSIS DIP (PROADVANTAGE DEVICE)
BILIRUBIN UA: NEGATIVE
BILIRUBIN UA: NEGATIVE mg/dL
Blood, UA: NEGATIVE
Glucose, UA: NEGATIVE mg/dL
Leukocytes, UA: NEGATIVE
Nitrite, UA: NEGATIVE
PROTEIN UA: NEGATIVE mg/dL
SPECIFIC GRAVITY, URINE: 1.025
Urobilinogen, Ur: NEGATIVE
pH, UA: 6 (ref 5.0–8.0)

## 2017-09-09 MED ORDER — TAMSULOSIN HCL 0.4 MG PO CAPS: 0.4000 mg | ORAL_CAPSULE | Freq: Every day | ORAL | 0 refills | Status: DC

## 2017-09-09 NOTE — Patient Instructions (Addendum)
Recommendations:  See your eye doctor yearly for routine vision care.  See your dentist yearly for routine dental care including hygiene visits twice yearly.  Check out the book, 5 Love Languages for Children  Work on losing some weight  Get exercise regularly   Recommendations for improving lipids:  Foods to avoid or limit - fried foods, high sugar foods, white bread, enriched flour, fast food, red meat, large amounts of cheese, processed foods such as little debbie cakes, cookies, pies, donuts, for example  Foods to include in the diet - whole grains such as whole grain pasta, whole grain bread, barley, steel cut oatmeal (not instant oatmeal), avocado, fish, green leafy vegetables, nuts, increased fiber in diet, and using olive oil in small amounts for cooking or as salad dressing vinaigrette.       [facility] Dr. Gelene Minkimothy Koop [address], [address] [place], [place] [address]  [phone] [email or web address]   Vincenza HewsSigmund S. Gould, M.D. Susanne GreenhouseJason A. Gould, O.D. [address] [place], [place] [address] Medical telephone: [phone] Optical telephone: [phone]    Dr. Yancey Flemingsavid Civils, dentist [address], [place], [place] [address] [phone] [email or web address]

## 2017-09-09 NOTE — Addendum Note (Signed)
Addended by: Winn JockVALENTINE, Taisei Bonnette N on: 09/09/2017 11:23 AM   Modules accepted: Orders

## 2017-09-09 NOTE — Progress Notes (Signed)
Subjective:   HPI  Hunter Gomez is a 46 y.o. male who presents for physical Chief Complaint  Patient presents with  . Annual Exam    physical , no conerns     Medical care team includes: Tysinger, Kermit Baloavid S, PA-C here for primary care Dentist Eye doctor  Concerns: No particular c/o  His 46yo son is rebellious, giving him some problems  Reviewed their medical, surgical, family, social, medication, and allergy history and updated chart as appropriate.  Past Medical History:  Diagnosis Date  . CAD (coronary artery disease)    a.  NSTEMI (1/16):  LHC - dLM 95, oLAD 90, mLAD 30, oCFX 80-90, pCFX 50, oPDA 80, oPLA 75, EF 55-65% >> urgent CABG  . Hepatic steatosis   . HLD (hyperlipidemia)   . Hx of echocardiogram    Echo (2/16):  Mild LVH, EF 55-60%, Gr 2 DD, trivial AI/TR, no effusion  . Hypertension 10/2012  . Muscle weakness 1997   prior seizure like activity, but none since; prior neurology eval, prior normal EEG 1997  . Obesity    s/p Lap Band surgery  . Vision problem    prior use of glasses, not currently    Past Surgical History:  Procedure Laterality Date  . CHOLECYSTECTOMY N/A 12/31/2016   Procedure: LAPAROSCOPIC CHOLECYSTECTOMY;  Surgeon: Harriette Bouillonhomas Cornett, MD;  Location: [facility] OR;  Service: General;  Laterality: N/A;  . CORONARY ARTERY BYPASS GRAFT N/A 12/18/2014   Procedure: CORONARY ARTERY BYPASS GRAFTING (CABG), ON PUMP, TIMES FIVE, USING BILATERAL INTERNAL MAMMARY ARTERIES, RIGHT GREATER SAPHENOUS VEIN HARVESTED ENDOSCOPICALLY;  Surgeon: Kerin PernaPeter Van Trigt, MD;  [facility];  Service: Open Heart Surgery;  Laterality: N/A;  LIMA-LAD, SVG-D, SVG-OM, Free RIMA-Ramus, SVG-PD  . ENDOSCOPIC RETROGRADE CHOLANGIOPANCREATOGRAPHY (ERCP) WITH PROPOFOL N/A 12/29/2016   Procedure: ENDOSCOPIC RETROGRADE CHOLANGIOPANCREATOGRAPHY (ERCP) WITH PROPOFOL;  Surgeon: Rachael Feeaniel P Jacobs, MD;  [facility];  Service: Endoscopy;  Laterality: N/A;  . INTRAOPERATIVE TRANSESOPHAGEAL  ECHOCARDIOGRAM N/A 12/18/2014   Procedure: INTRAOPERATIVE TRANSESOPHAGEAL ECHOCARDIOGRAM;  Surgeon: Kerin PernaPeter Van Trigt, MD;  [facility];  Service: Open Heart Surgery;  Laterality: N/A;  . LAPAROSCOPIC GASTRIC BANDING  2007   initial procedure [place]iz [place], [place], [place]  . LAPAROSCOPIC GASTRIC BANDING  2006  . LEFT HEART CATHETERIZATION WITH CORONARY ANGIOGRAM N/A 12/18/2014   Procedure: LEFT HEART CATHETERIZATION WITH CORONARY ANGIOGRAM;  Surgeon: Micheline ChapmanMichael D Cooper, MD;  [facility];  Service: Cardiovascular;  Laterality: N/A;  . TONSILLECTOMY    . WISDOM TOOTH EXTRACTION      Social History   Social History  . Marital status: Married    Spouse name: N/A  . Number of children: N/A  . Years of education: N/A   Occupational History  . Not on file.   Social History Main Topics  . Smoking status: Never Smoker  . Smokeless tobacco: Never Used  . Alcohol use No  . Drug use: No  . Sexual activity: Not on file   Other Topics Concern  . 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 Diabetes Mother   . Hypertension Mother   . Hyperlipidemia Mother   . Heart disease Mother [id number]       CABG  . Peripheral vascular disease Mother   . Hypertension Father   .  Hyperlipidemia Father   . Heart disease Father 31       CABG  . Stroke Father   . Hypertension Brother   . Hyperlipidemia Brother   . Cancer Maternal Grandfather        stomach  . Cancer Paternal Grandmother   . Heart attack Neg Hx      Current Outpatient Prescriptions:  .  aspirin 325 MG EC tablet, Take 325 mg by mouth daily., Disp: , Rfl:  .  atorvastatin (LIPITOR) 80 MG tablet, TAKE 1 TABLET(80 MG) BY MOUTH DAILY, Disp: 30 tablet, Rfl: 11 .  lisinopril (PRINIVIL,ZESTRIL) 10 MG tablet, TAKE 1 TABLET(10 MG)  BY MOUTH DAILY, Disp: 30 tablet, Rfl: 11 .  metoprolol tartrate (LOPRESSOR) 50 MG tablet, TAKE 1 TABLET(50 MG) BY MOUTH TWICE DAILY, Disp: 60 tablet, Rfl: 11 .  nitroGLYCERIN (NITROSTAT) 0.4 MG SL tablet, Place 1 tablet (0.4 mg total) under the tongue every 5 (five) minutes as needed for chest pain., Disp: 25 tablet, Rfl: 3 .  tamsulosin (FLOMAX) 0.4 MG CAPS capsule, Take 1 capsule (0.4 mg total) by mouth daily after breakfast., Disp: 30 capsule, Rfl: 0  No Known Allergies     Review of Systems Constitutional: -fever, -chills, -sweats, -unexpected weight change, -decreased appetite, -fatigue Allergy: -sneezing, -itching, -congestion Dermatology: -changing moles, --rash, -lumps ENT: -runny nose, -ear pain, -sore throat, -hoarseness, -sinus pain, -teeth pain, - ringing in ears, -hearing loss, -nosebleeds Cardiology: -chest pain, -palpitations, -swelling, -difficulty breathing when lying flat, -waking up short of breath Respiratory: -cough, -shortness of breath, -difficulty breathing with exercise or exertion, -wheezing, -coughing up blood Gastroenterology: -abdominal pain, -nausea, -vomiting, -diarrhea, -constipation, -blood in stool, -changes in bowel movement, -difficulty swallowing or eating Hematology: -bleeding, -bruising  Musculoskeletal: -joint aches, -muscle aches, -joint swelling, -back pain, -neck pain, -cramping, -changes in gait Ophthalmology: denies vision changes, eye redness, itching, discharge Urology: -burning with urination, -difficulty urinating, -blood in urine, -urinary frequency, -urgency, -incontinence Neurology: -headache, -weakness, -tingling, -numbness, -memory loss, -falls, -dizziness Psychology: -depressed mood, -agitation, -sleep problems     Objective:   BP 132/80   Pulse (!) 54   Ht 5\' 8"  (1.727 m)   Wt 226 lb 12.8 oz (102.9 kg)   SpO2 97%   BMI 34.48 kg/m   General appearance: alert, no distress, WD/WN, Caucasian male Skin: scattered macules, no  worrisome lesions HEENT: normocephalic, conjunctiva/corneas normal, sclerae anicteric, PERRLA, EOMi, nares patent, no discharge or erythema, pharynx normal Oral cavity: MMM, tongue normal, teeth normal Neck: supple, no lymphadenopathy, no thyromegaly, no masses, normal ROM, no bruits Chest: mid chest sternotomy scar, lower chest central port scars, otherwise non tender, normal shape and expansion Heart: RRR, normal S1, S2, no murmurs Lungs: CTA bilaterally, no wheezes, rhonchi, or rales Abdomen: +bs, soft, non tender, non distended, no masses, no hepatomegaly, no splenomegaly, no bruits Back: non tender, normal ROM, no scoliosis Musculoskeletal: upper extremities non tender, no obvious deformity, normal ROM throughout, lower extremities non tender, no obvious deformity, normal ROM throughout Extremities: no edema, no cyanosis, no clubbing Pulses: 2+ symmetric, upper and lower extremities, normal cap refill Neurological: alert, oriented x 3, CN2-12 intact, strength normal upper extremities and lower extremities, sensation normal throughout, DTRs 2+ throughout, no cerebellar signs, gait normal Psychiatric: normal affect, behavior normal, pleasant  GU: normal male external genitalia,circumcised, nontender, no masses, no hernia, no lymphadenopathy Rectal: deferred   Assessment and Plan :    Encounter Diagnoses  Name Primary?  . Encounter for health maintenance examination in  adult Yes  . Need for pneumococcal vaccination   . Need for influenza vaccination   . Ureteral stone with hydronephrosis   . Coronary artery disease involving native coronary artery of native heart without angina pectoris   . Essential hypertension, benign   . NSTEMI (non-ST elevated myocardial infarction) (HCC)   . Hepatic steatosis   . Family history of peripheral arterial disease   . Hyperlipidemia, unspecified hyperlipidemia type   . Leukocytosis, unspecified type   . Class 1 obesity with serious comorbidity in  adult, unspecified BMI, unspecified obesity type   . S/P CABG x 5     Physical exam - discussed and counseled on healthy lifestyle, diet, exercise, preventative care, vaccinations, sick and well care, proper use of emergency dept and after hours care, and addressed their concerns.    Health screening: See your eye doctor yearly for routine vision care. See your dentist yearly for routine dental care including hygiene visits twice yearly.  Cancer screening Discussed colonoscopy screening age 80yo unless higher risk for earlier screening Discussed PSA, prostate exam, and prostate cancer screening risks/benefits.   Discussed prostate symptoms as well.  Prostate screening performed: No  Vaccinations: Counseled on the influenza virus vaccine.  Vaccine information sheet given.  Influenza vaccine given after consent obtained.  Counseled on the pneumococcal vaccine.  Vaccine information sheet given.  Pneumococcal vaccine PPSV23 given after consent obtained.  Labs today.  begin Flomax to help the ureteral stone pass.   He has oxycodone at home.  Advised if creatinine elevated, may need ultrasound.   F/u pending labs  Reviewed recent cardiology notes.  Hunter Gomez was seen today for annual exam.  Diagnoses and all orders for this visit:  Encounter for health maintenance examination in adult -     Lipid panel -     Basic metabolic panel -     CBC with Differential/Platelet -     Hemoglobin A1c  Need for pneumococcal vaccination  Need for influenza vaccination  Ureteral stone with hydronephrosis  Coronary artery disease involving native coronary artery of native heart without angina pectoris  Essential hypertension, benign  NSTEMI (non-ST elevated myocardial infarction) (HCC)  Hepatic steatosis  Family history of peripheral arterial disease  Hyperlipidemia, unspecified hyperlipidemia type  Leukocytosis, unspecified type  Class 1 obesity with serious comorbidity in adult,  unspecified BMI, unspecified obesity type  S/P CABG x 5  Other orders -     tamsulosin (FLOMAX) 0.4 MG CAPS capsule; Take 1 capsule (0.4 mg total) by mouth daily after breakfast.  Follow-up pending labs, yearly for physical

## 2017-09-10 LAB — CBC WITH DIFFERENTIAL/PLATELET
BASOS ABS: 81 {cells}/uL (ref 0–200)
Basophils Relative: 0.8 %
EOS ABS: 333 {cells}/uL (ref 15–500)
Eosinophils Relative: 3.3 %
HCT: 41.6 % (ref 38.5–50.0)
HEMOGLOBIN: 14 g/dL (ref 13.2–17.1)
Lymphs Abs: 3777 cells/uL (ref 850–3900)
MCH: 27.7 pg (ref 27.0–33.0)
MCHC: 33.7 g/dL (ref 32.0–36.0)
MCV: 82.4 fL (ref 80.0–100.0)
MPV: 10.3 fL (ref 7.5–12.5)
Monocytes Relative: 8.7 %
NEUTROS ABS: 5030 {cells}/uL (ref 1500–7800)
Neutrophils Relative %: 49.8 %
Platelets: 334 10*3/uL (ref 140–400)
RBC: 5.05 10*6/uL (ref 4.20–5.80)
RDW: 12.8 % (ref 11.0–15.0)
TOTAL LYMPHOCYTE: 37.4 %
WBC: 10.1 10*3/uL (ref 3.8–10.8)
WBCMIX: 879 {cells}/uL (ref 200–950)

## 2017-09-10 LAB — BASIC METABOLIC PANEL
BUN: 11 mg/dL (ref 7–25)
CALCIUM: 9.3 mg/dL (ref 8.6–10.3)
CO2: 27 mmol/L (ref 20–32)
Chloride: 102 mmol/L (ref 98–110)
Creat: 0.79 mg/dL (ref 0.60–1.35)
GLUCOSE: 96 mg/dL (ref 65–99)
Potassium: 4.3 mmol/L (ref 3.5–5.3)
Sodium: 139 mmol/L (ref 135–146)

## 2017-09-10 LAB — LIPID PANEL
CHOL/HDL RATIO: 3 (calc) (ref <5.0)
Cholesterol: 120 mg/dL (ref <200)
HDL: 40 mg/dL — ABNORMAL LOW (ref >40)
LDL CHOLESTEROL (CALC): 59 mg/dL
NON-HDL CHOLESTEROL (CALC): 80 mg/dL (ref <130)
Triglycerides: 128 mg/dL (ref <150)

## 2017-09-10 LAB — HEMOGLOBIN A1C
EAG (MMOL/L): 6 (calc)
HEMOGLOBIN A1C: 5.4 %{Hb} (ref <5.7)
Mean Plasma Glucose: 108 (calc)

## 2017-09-10 LAB — SPECIMEN COMPROMISED

## 2017-12-12 ENCOUNTER — Other Ambulatory Visit: Payer: Self-pay | Admitting: Medical

## 2017-12-15 ENCOUNTER — Encounter: Payer: Self-pay | Admitting: Medical

## 2017-12-15 ENCOUNTER — Ambulatory Visit: Payer: BLUE CROSS/BLUE SHIELD | Admitting: Medical

## 2017-12-15 VITALS — BP 112/80 | HR 61 | Temp 97.1°F | Wt 226.8 lb

## 2017-12-15 DIAGNOSIS — Z87442 Personal history of urinary calculi: Secondary | ICD-10-CM | POA: Diagnosis not present

## 2017-12-15 DIAGNOSIS — R109 Unspecified abdominal pain: Secondary | ICD-10-CM

## 2017-12-15 DIAGNOSIS — R319 Hematuria, unspecified: Secondary | ICD-10-CM

## 2017-12-15 LAB — POCT URINALYSIS DIP (PROADVANTAGE DEVICE)
BILIRUBIN UA: NEGATIVE
BILIRUBIN UA: NEGATIVE mg/dL
GLUCOSE UA: NEGATIVE mg/dL
Leukocytes, UA: NEGATIVE
NITRITE UA: NEGATIVE
PH UA: 6 (ref 5.0–8.0)
Specific Gravity, Urine: 1.025
Urobilinogen, Ur: NEGATIVE

## 2017-12-15 MED ORDER — TAMSULOSIN HCL 0.4 MG PO CAPS: ORAL_CAPSULE | ORAL | 1 refills | Status: DC

## 2017-12-15 MED ORDER — OXYCODONE-ACETAMINOPHEN 5-325 MG PO TABS: 1.0000 | ORAL_TABLET | Freq: Four times a day (QID) | ORAL | 0 refills | Status: DC | PRN

## 2017-12-15 MED ORDER — ONDANSETRON HCL 4 MG PO TABS: 4.0000 mg | ORAL_TABLET | Freq: Three times a day (TID) | ORAL | 0 refills | Status: DC | PRN

## 2017-12-15 NOTE — Progress Notes (Signed)
Subjective: Chief Complaint  Patient presents with  . possible kidney stones    lower back pain , hx of kidney stones   Been feeling bad since Thursday 6 days ago.  The next day had pain with urination, slower than normal urine stream.  Had left over oxycodone he took over weekend, slept on  Heating pad that night on Saturday.  Then Sunday awoke and felt fine.  Yesterday started back with same symptoms.   Had bad pain all night, couldn't sleep well.   Was in the ED in June for similar, 3 cm stone with hydronephrosis.   Urine stream was dribbling last few days.   In general gets up once abo ut 3am per night to urinate in general.  No fever, no body aches, no chills.  No concern for STD.  No other aggravating or relieving factors. No other complaint.   Past Medical History:  Diagnosis Date  . CAD (coronary artery disease)    a.  NSTEMI (1/16):  LHC - dLM 95, oLAD 90, mLAD 30, oCFX 80-90, pCFX 50, oPDA 80, oPLA 75, EF 55-65% >> urgent CABG  . Hepatic steatosis   . HLD (hyperlipidemia)   . Hx of echocardiogram    Echo (2/16):  Mild LVH, EF 55-60%, Gr 2 DD, trivial AI/TR, no effusion  . Hypertension 10/2012  . Muscle weakness 1997   prior seizure like activity, but none since; prior neurology eval, prior normal EEG 1997  . Obesity    s/p Lap Band surgery  . Vision problem    prior use of glasses, not currently   Current Outpatient Medications on File Prior to Visit  Medication Sig Dispense Refill  . atorvastatin (LIPITOR) 80 MG tablet TAKE 1 TABLET(80 MG) BY MOUTH DAILY 30 tablet 11  . lisinopril (PRINIVIL,ZESTRIL) 10 MG tablet TAKE 1 TABLET(10 MG) BY MOUTH DAILY 30 tablet 11  . metoprolol tartrate (LOPRESSOR) 50 MG tablet TAKE 1 TABLET(50 MG) BY MOUTH TWICE DAILY 60 tablet 11  . nitroGLYCERIN (NITROSTAT) 0.4 MG SL tablet Place 1 tablet (0.4 mg total) under the tongue every 5 (five) minutes as needed for chest pain. 25 tablet 3  . aspirin 325 MG EC tablet Take 325 mg by mouth daily.      No current facility-administered medications on file prior to visit.    ROS as in subjective   Objective; BP 112/80   Pulse 61   Temp (!) 97.1 F (36.2 C)   Wt 226 lb 12.8 oz (102.9 kg)   SpO2 96%   BMI 34.48 kg/m   General appearance: alert, no distress, WD/WN,  Oral cavity: MMM, no lesions Neck: supple, no lymphadenopathy, no thyromegaly, no masses Heart: RRR, normal S1, S2, no murmurs Lungs: CTA bilaterally, no wheezes, rhonchi, or rales Abdomen: +bs, soft, non tender, non distended, no masses, no hepatomegaly, no splenomegaly Back: nontender, no pain with ROM Pulses: 2+ symmetric, upper and lower extremities, normal cap refill DRE: anus normal tone, prostate WNL    Assessment: Encounter Diagnoses  Name Primary?  . Flank pain Yes  . Hematuria, unspecified type   . History of renal stone      Plan: Advised relative rest, plenty of hydration throughout the day, can use Zofran for nausea as needed, either Tylenol over-the-counter for pain or Percocet as needed for worse pain, he is already taking Flomax starting this last week.  He will watch for any kind of solid debris in the urine to collect.  We will call  with lab results, will likely pursue imaging.  If much worse over the next day or 2, then call or return or go to the emergency department   Hunter Gomez was seen today for possible kidney stones.  Diagnoses and all orders for this visit:  Flank pain -     Basic metabolic panel -     Urine Culture -     US Renal; Future  Hematuria, unspecified type -     POCT Urinalysis DIP (Proadvantage Device) -     Basic metabolic panel -     Urine Culture -     US Renal; Future  History of renal stone -     Basic metabolic panel -     Urine Culture -     US Renal; Future  Other orders -     tamsulosin (FLOMAX) 0.4 MG CAPS capsule; TAKE 1 CAPSULE(0.4 MG) BY MOUTH DAILY AFTER BREAKFAST -     oxyCODONE-acetaminophen (PERCOCET) 5-325 MG tablet; Take 1 tablet by mouth  every 6 (six) hours as needed for severe pain. -     ondansetron (ZOFRAN) 4 MG tablet; Take 1 tablet (4 mg total) by mouth every 8 (eight) hours as needed for nausea or vomiting.

## 2017-12-15 NOTE — Patient Instructions (Signed)
Recommendations:  Drink plenty of water throughout the day  Watch for solid stone debris in the urine, try to collect this if possible to bring in for analysis  You can use either over-the-counter Tylenol for pain or the Percocet for worse pain.  Caution as Percocet can make you sleepy  Use the Zofran as needed for nausea and vomiting  Please take the Flomax for now to help the kidney stone past  If you are much worse in the next few days call, return, or go to the emergency department.   Kidney Stones Kidney stones (urolithiasis) are rock-like masses that form inside of the kidneys. Kidneys are organs that make pee (urine). A kidney stone can cause very bad pain and can block the flow of pee. The stone usually leaves your body (passes) through your pee. You may need to have a doctor take out the stone. Follow these instructions at home: Eating and drinking  Drink enough fluid to keep your pee clear or pale yellow. This will help you pass the stone.  If told by your doctor, change the foods you eat (your diet). This may include: ? Limiting how much salt (sodium) you eat. ? Eating more fruits and vegetables. ? Limiting how much meat, poultry, fish, and eggs you eat.  Follow instructions from your doctor about eating or drinking restrictions. General instructions  Collect pee samples as told by your doctor. You may need to collect a pee sample: ? 24 hours after a stone comes out. ? 8-12 weeks after a stone comes out, and every 6-12 months after that.  Strain your pee every time you pee (urinate), for as long as told. Use the strainer that your doctor recommends.  Do not throw out the stone. Keep it so that it can be tested by your doctor.  Take over-the-counter and prescription medicines only as told by your doctor.  Keep all follow-up visits as told by your doctor. This is important. You may need follow-up tests. Preventing kidney stones To prevent another kidney  stone:  Drink enough fluid to keep your pee clear or pale yellow. This is the best way to prevent kidney stones.  Eat healthy foods.  Avoid certain foods as told by your doctor. You may be told to eat less protein.  Stay at a healthy weight.  Contact a doctor if:  You have pain that gets worse or does not get better with medicine. Get help right away if:  You have a fever or chills.  You get very bad pain.  You get new pain in your belly (abdomen).  You pass out (faint).  You cannot pee. This information is not intended to replace advice given to you by your health care provider. Make sure you discuss any questions you have with your health care provider. Document Released: 04/28/2008 Document Revised: 07/29/2016 Document Reviewed: 07/29/2016 Elsevier Interactive Patient Education  2017 [employer]

## 2017-12-16 ENCOUNTER — Other Ambulatory Visit: Payer: Self-pay | Admitting: Medical

## 2017-12-16 DIAGNOSIS — R109 Unspecified abdominal pain: Secondary | ICD-10-CM

## 2017-12-16 LAB — BASIC METABOLIC PANEL
BUN / CREAT RATIO: 15 (ref 9–20)
BUN: 13 mg/dL (ref 6–24)
CHLORIDE: 100 mmol/L (ref 96–106)
CO2: 23 mmol/L (ref 20–29)
CREATININE: 0.84 mg/dL (ref 0.76–1.27)
Calcium: 9.2 mg/dL (ref 8.7–10.2)
GFR calc Af Amer: 121 mL/min/{1.73_m2} (ref >59)
GFR calc non Af Amer: 105 mL/min/{1.73_m2} (ref >59)
GLUCOSE: 113 mg/dL — AB (ref 65–99)
Potassium: 4.6 mmol/L (ref 3.5–5.2)
SODIUM: 138 mmol/L (ref 134–144)

## 2017-12-16 LAB — URINE CULTURE: ORGANISM ID, BACTERIA: NO GROWTH

## 2018-01-01 ENCOUNTER — Other Ambulatory Visit: Payer: Self-pay | Admitting: Medical

## 2018-01-01 DIAGNOSIS — N200 Calculus of kidney: Secondary | ICD-10-CM

## 2018-01-02 ENCOUNTER — Ambulatory Visit
Admission: RE | Admit: 2018-01-02 | Discharge: 2018-01-02 | Disposition: A | Discharge status: Home / Self Care | Payer: BLUE CROSS/BLUE SHIELD | Source: Ambulatory Visit | Attending: Medical | Admitting: Medical

## 2018-01-02 DIAGNOSIS — R3129 Other microscopic hematuria: Secondary | ICD-10-CM | POA: Diagnosis not present

## 2018-01-02 DIAGNOSIS — N200 Calculus of kidney: Secondary | ICD-10-CM

## 2018-01-07 ENCOUNTER — Other Ambulatory Visit: Payer: Self-pay

## 2018-01-07 DIAGNOSIS — N200 Calculus of kidney: Secondary | ICD-10-CM

## 2018-01-28 DIAGNOSIS — N201 Calculus of ureter: Secondary | ICD-10-CM | POA: Diagnosis not present

## 2018-01-28 DIAGNOSIS — N401 Enlarged prostate with lower urinary tract symptoms: Secondary | ICD-10-CM | POA: Diagnosis not present

## 2018-01-28 DIAGNOSIS — R351 Nocturia: Secondary | ICD-10-CM | POA: Diagnosis not present

## 2018-01-28 DIAGNOSIS — R350 Frequency of micturition: Secondary | ICD-10-CM | POA: Diagnosis not present

## 2018-04-08 DIAGNOSIS — R351 Nocturia: Secondary | ICD-10-CM | POA: Diagnosis not present

## 2018-04-08 DIAGNOSIS — N401 Enlarged prostate with lower urinary tract symptoms: Secondary | ICD-10-CM | POA: Diagnosis not present

## 2018-04-08 DIAGNOSIS — N201 Calculus of ureter: Secondary | ICD-10-CM | POA: Diagnosis not present

## 2018-09-04 ENCOUNTER — Other Ambulatory Visit: Payer: Self-pay | Admitting: Cardiovascular Disease

## 2018-09-04 DIAGNOSIS — I2510 Atherosclerotic heart disease of native coronary artery without angina pectoris: Secondary | ICD-10-CM

## 2018-09-06 ENCOUNTER — Other Ambulatory Visit: Payer: Self-pay | Admitting: Cardiovascular Disease

## 2018-09-06 DIAGNOSIS — I2510 Atherosclerotic heart disease of native coronary artery without angina pectoris: Secondary | ICD-10-CM

## 2018-09-06 MED ORDER — METOPROLOL TARTRATE 50 MG PO TABS: ORAL_TABLET | ORAL | 0 refills | Status: DC

## 2018-09-06 MED ORDER — ATORVASTATIN CALCIUM 80 MG PO TABS: ORAL_TABLET | ORAL | 0 refills | Status: DC

## 2018-09-06 MED ORDER — LISINOPRIL 10 MG PO TABS: ORAL_TABLET | ORAL | 0 refills | Status: DC

## 2018-10-04 ENCOUNTER — Other Ambulatory Visit: Payer: Self-pay | Admitting: Cardiovascular Disease

## 2018-10-04 DIAGNOSIS — I2510 Atherosclerotic heart disease of native coronary artery without angina pectoris: Secondary | ICD-10-CM

## 2018-11-03 ENCOUNTER — Other Ambulatory Visit: Payer: Self-pay | Admitting: Cardiovascular Disease

## 2018-11-03 DIAGNOSIS — I2510 Atherosclerotic heart disease of native coronary artery without angina pectoris: Secondary | ICD-10-CM

## 2018-12-11 ENCOUNTER — Other Ambulatory Visit: Payer: Self-pay | Admitting: Cardiovascular Disease

## 2018-12-11 DIAGNOSIS — I2510 Atherosclerotic heart disease of native coronary artery without angina pectoris: Secondary | ICD-10-CM

## 2018-12-16 ENCOUNTER — Telehealth: Payer: Self-pay | Admitting: Cardiovascular Disease

## 2018-12-16 ENCOUNTER — Other Ambulatory Visit: Payer: Self-pay

## 2018-12-16 DIAGNOSIS — I2510 Atherosclerotic heart disease of native coronary artery without angina pectoris: Secondary | ICD-10-CM

## 2018-12-16 MED ORDER — METOPROLOL TARTRATE 50 MG PO TABS: ORAL_TABLET | ORAL | 0 refills | Status: DC

## 2018-12-16 MED ORDER — ATORVASTATIN CALCIUM 80 MG PO TABS: 80.0000 mg | ORAL_TABLET | Freq: Every day | ORAL | 0 refills | Status: DC

## 2018-12-16 MED ORDER — LISINOPRIL 10 MG PO TABS: ORAL_TABLET | ORAL | 0 refills | Status: DC

## 2018-12-16 NOTE — Telephone Encounter (Signed)
New message     *STAT* If patient is at the pharmacy, call can be transferred to refill team.   1. Which medications need to be refilled? (please list name of each medication and dose if known) lisinopril (PRINIVIL,ZESTRIL) 10 MG tablet, atorvastatin (LIPITOR) 80 MG tablet metoprolol tartrate (LOPRESSOR) 50 MG tablet   2. Which pharmacy/location (including street and city if local pharmacy) is medication to be sent to?[id number] - [place], [place] - [address] AT [facility] OF [address]  3. Do they need a 30 day or 90 day supply? 30

## 2018-12-17 ENCOUNTER — Encounter: Payer: Self-pay | Admitting: Physician Assistant

## 2019-01-06 NOTE — Progress Notes (Signed)
Cardiology Office Note:    Date:  01/07/2019   ID:  Monika Salk, DOB 1971/05/21, MRN [id number]  PCP:  Carlena Hurl, PA-C  Cardiologist:  Sherren Mocha, MD   Electrophysiologist:  None   Referring MD: Carlena Hurl, PA-C   Chief Complaint  Patient presents with  . Follow-up    CAD     History of Present Illness:    Hunter Gomez is a 48 y.o. male with coronary artery disease s/p NSTEMI in 2016 followed by coronary artery bypass grafting.  Hunter Gomez has a hx of gallstone pancreatitis requiring ERCP and cholecystectomy.  Hunter Gomez was last seen by Dr. Burt Knack in 08/2017.     Hunter Gomez returns for follow up.  Hunter Gomez is here alone.  Hunter Gomez has a chronic sensitivity of his chest since his CABG.  Otherwise, Hunter Gomez has not had chest pain, shortness of breath, syncope, paroxysmal nocturnal dyspnea, edema.  Hunter Gomez does not have symptoms of claudication.    Prior CV studies:   The following studies were reviewed today:  Echo 01/10/15 Mild LVH, EF 62-83%, grade 2 diastolic dysfunction, trivial AI, trivial TR  Cardiac Cath 12/18/14 LM: Distal 95% extending into ostium of LAD and LCx LAD: Ostial 90%, mid 30% LCx: ostial 80-90%, proximal 50% RCA: Ostial PDA 80%, ostial PLA 75% EF 55-65% >> Emergent CABG  Carotid US 12/18/14 Bilateral ICA 1-39%  Past Medical History:  Diagnosis Date  . CAD (coronary artery disease)    a.  NSTEMI (1/16):  LHC - dLM 95, oLAD 90, mLAD 30, oCFX 80-90, pCFX 50, oPDA 80, oPLA 75, EF 55-65% >> urgent CABG  . Hepatic steatosis   . HLD (hyperlipidemia)   . Hx of echocardiogram    Echo (2/16):  Mild LVH, EF 55-60%, Gr 2 DD, trivial AI/TR, no effusion  . Hypertension 10/2012  . Muscle weakness 1997   prior seizure like activity, but none since; prior neurology eval, prior normal EEG 1997  . Obesity    s/p Lap Band surgery  . Vision problem    prior use of glasses, not currently   Surgical Hx: The patient  has a past surgical history that includes Tonsillectomy; Wisdom  tooth extraction; Laparoscopic gastric banding (2007); Laparoscopic gastric banding (2006); left heart catheterization with coronary angiogram (N/A, 12/18/2014); Coronary artery bypass graft (N/A, 12/18/2014); Intraoprative transesophageal echocardiogram (N/A, 12/18/2014); Endoscopic retrograde cholangiopancreatography (ercp) with propofol (N/A, 12/29/2016); and Cholecystectomy (N/A, 12/31/2016).   Current Medications: Current Meds  Medication Sig  . aspirin 81 MG tablet Take 81 mg by mouth daily.  Marland Kitchen atorvastatin (LIPITOR) 80 MG tablet Take 1 tablet (80 mg total) by mouth daily.  Marland Kitchen lisinopril (PRINIVIL,ZESTRIL) 10 MG tablet Take 1 tablet (10 mg total) by mouth daily.  . metoprolol tartrate (LOPRESSOR) 50 MG tablet Take 1 tablet (50 mg total) by mouth 2 (two) times daily.  . nitroGLYCERIN (NITROSTAT) 0.4 MG SL tablet Place 1 tablet (0.4 mg total) under the tongue every 5 (five) minutes as needed for chest pain.  Marland Kitchen ondansetron (ZOFRAN) 4 MG tablet Take 1 tablet (4 mg total) by mouth every 8 (eight) hours as needed for nausea or vomiting.  Marland Kitchen oxyCODONE-acetaminophen (PERCOCET) 5-325 MG tablet Take 1 tablet by mouth every 6 (six) hours as needed for severe pain.  . tamsulosin (FLOMAX) 0.4 MG CAPS capsule TAKE 1 CAPSULE(0.4 MG) BY MOUTH DAILY AFTER BREAKFAST  . [DISCONTINUED] aspirin 325 MG EC tablet Take 325 mg by mouth daily.     Allergies:  Patient has no known allergies.   Social History   Tobacco Use  . Smoking status: Never Smoker  . Smokeless tobacco: Never Used  Substance Use Topics  . Alcohol use: No  . Drug use: No     Family Hx: The patient's family history includes Cancer in his maternal grandfather and paternal grandmother; Diabetes in his mother; Heart disease (age of onset: 72) in his father; Heart disease (age of onset: 67) in his mother; Hyperlipidemia in his brother, father, and mother; Hypertension in his brother, father, and mother; Peripheral vascular disease in his mother;  Stroke in his father. There is no history of Heart attack.  ROS:   Please see the history of present illness.    Review of Systems  Gastrointestinal: Positive for diarrhea.   All other systems reviewed and are negative.   EKGs/Labs/Other Test Reviewed:    EKG:  EKG is  ordered today.  The ekg ordered today demonstrates normal sinus rhythm, HR 59, normal axis, j point elevation, QTc 401, no changes.   Recent Labs: No results found for requested labs within last 8760 hours.   Recent Lipid Panel Lab Results  Component Value Date/Time   CHOL 120 09/09/2017 11:04 AM   TRIG 128 09/09/2017 11:04 AM   HDL 40 (L) 09/09/2017 11:04 AM   CHOLHDL 3.0 09/09/2017 11:04 AM   LDLCALC 59 09/09/2017 11:04 AM    Physical Exam:    VS:  BP 130/70   Pulse (!) 59   Ht _0  (1.727 m)   Wt 240 lb 6.4 oz (109 kg)   SpO2 98%   BMI 36.55 kg/m     Wt Readings from Last 3 Encounters:  01/07/19 240 lb 6.4 oz (109 kg)  12/15/17 226 lb 12.8 oz (102.9 kg)  09/09/17 226 lb 12.8 oz (102.9 kg)     Physical Exam  Constitutional: Hunter Gomez is oriented to person, place, and time. Hunter Gomez appears well-developed and well-nourished. No distress.  HENT:  Head: Normocephalic and atraumatic.  Neck: Neck supple. No JVD present.  Cardiovascular: Normal rate, regular rhythm, S1 normal and S2 normal.  No murmur heard. DP/PT2+ bilat  Pulmonary/Chest: Breath sounds normal. Hunter Gomez has no rales.  Abdominal: Soft. There is no hepatomegaly.  Musculoskeletal:        General: No edema.  Neurological: Hunter Gomez is alert and oriented to person, place, and time.  Skin: Skin is warm and dry.    ASSESSMENT & PLAN:    Coronary artery disease involving native coronary artery of native heart without angina pectoris  Hx of NSTEMI in 2016 followed by coronary artery bypass grafting.  Hunter Gomez is overall doing well without anginal symptoms.  Continue ASA, statin, beta-blocker.  Essential hypertension, benign The patient's blood pressure is  controlled on his current regimen.  Continue current therapy.    Hyperlipidemia, unspecified hyperlipidemia type Hunter Gomez notes diarrhea about every 1-2 weeks.  I suggested Hunter Gomez hold the Lipitor for 3-4 weeks to see if his symptoms resolve.  If so, we could try Crestor.  If not, Hunter Gomez can resume and follow up with PCP.  His symptoms may all be related to his prior cholecystectomy.  Arrange CMET, fasting Lipids in 3 mos.     Dispo:  Return in about 1 year (around 01/08/2020) for Routine Follow Up, w/ Dr. Burt Knack, or Richardson Dopp, PA-C.   Medication Adjustments/Labs and Tests Ordered: Current medicines are reviewed at length with the patient today.  Concerns regarding medicines are outlined above.  Tests  Ordered: Orders Placed This Encounter  Procedures  . Comp Met (CMET)  . Lipid panel  . EKG 12-Lead   Medication Changes: Meds ordered this encounter  Medications  . atorvastatin (LIPITOR) 80 MG tablet    Sig: Take 1 tablet (80 mg total) by mouth daily.    Dispense:  30 tablet    Refill:  11    DO NOT REFILL until the patient calls for a refill.    Order Specific Question:   Supervising Provider    Answer:   Evans Lance [[id number]]  . lisinopril (PRINIVIL,ZESTRIL) 10 MG tablet    Sig: Take 1 tablet (10 mg total) by mouth daily.    Dispense:  30 tablet    Refill:  11    Order Specific Question:   Supervising Provider    Answer:   Evans Lance [[id number]]  . metoprolol tartrate (LOPRESSOR) 50 MG tablet    Sig: Take 1 tablet (50 mg total) by mouth 2 (two) times daily.    Dispense:  60 tablet    Refill:  11    Order Specific Question:   Supervising Provider    Answer:   Evans Lance [[id number]]    Signed, Richardson Dopp, PA-C  01/07/2019 8:54 AM    [facility] [place], [place], [place]  [address] Phone: [phone]; Fax: [phone]

## 2019-01-07 ENCOUNTER — Ambulatory Visit: Payer: BLUE CROSS/BLUE SHIELD | Admitting: Physician Assistant

## 2019-01-07 ENCOUNTER — Encounter: Payer: Self-pay | Admitting: Physician Assistant

## 2019-01-07 VITALS — BP 130/70 | HR 59 | Ht 68.0 in | Wt 240.4 lb

## 2019-01-07 DIAGNOSIS — E785 Hyperlipidemia, unspecified: Secondary | ICD-10-CM | POA: Diagnosis not present

## 2019-01-07 DIAGNOSIS — I2510 Atherosclerotic heart disease of native coronary artery without angina pectoris: Secondary | ICD-10-CM | POA: Diagnosis not present

## 2019-01-07 DIAGNOSIS — I10 Essential (primary) hypertension: Secondary | ICD-10-CM | POA: Diagnosis not present

## 2019-01-07 MED ORDER — ATORVASTATIN CALCIUM 80 MG PO TABS: 80.0000 mg | ORAL_TABLET | Freq: Every day | ORAL | 11 refills | Status: DC

## 2019-01-07 MED ORDER — LISINOPRIL 10 MG PO TABS: 10.0000 mg | ORAL_TABLET | Freq: Every day | ORAL | 11 refills | Status: DC

## 2019-01-07 MED ORDER — METOPROLOL TARTRATE 50 MG PO TABS: 50.0000 mg | ORAL_TABLET | Freq: Two times a day (BID) | ORAL | 11 refills | Status: DC

## 2019-01-07 NOTE — Patient Instructions (Signed)
Medication Instructions:   Hold your Lipitor (Atorvastatin) for 3-4 weeks.  If your diarrhea resolves, call me so we can change to a different medication.  If there is no difference, resume the Lipitor and follow up with your primary care doctor.  If you need a refill on your cardiac medications before your next appointment, please call your pharmacy.   Lab work:  Arrange Lipids and CMET in 3 months.   If you have labs (blood work) drawn today and your tests are completely normal, you will receive your results only by: Marland Kitchen MyChart Message (if you have MyChart) OR . A paper copy in the mail If you have any lab test that is abnormal or we need to change your treatment, we will call you to review the results.  Testing/Procedures: None   Follow-Up: At [facility], you and your health needs are our priority.  As part of our continuing mission to provide you with exceptional heart care, we have created designated Provider Care Teams.  These Care Teams include your primary Cardiologist (physician) and Advanced Practice Providers (APPs -  Physician Assistants and Nurse Practitioners) who all work together to provide you with the care you need, when you need it. You will need a follow up appointment in:  12 months.  Please call our office 2 months in advance to schedule this appointment.  You may see Tonny Bollman, MD or one of the following Advanced Practice Providers on your designated Care Team: [name], PA-C  Any Other Special Instructions Will Be Listed Below (If Applicable).

## 2019-01-07 NOTE — Progress Notes (Signed)
Please call patient to set up yearly physical.   This is a friendly reminder to have them come in for annual wellness exam/physical. Thanks Shane 

## 2019-01-07 NOTE — Progress Notes (Signed)
Left message for pt

## 2019-01-10 ENCOUNTER — Other Ambulatory Visit: Payer: Self-pay | Admitting: Cardiovascular Disease

## 2019-02-10 ENCOUNTER — Other Ambulatory Visit: Payer: Self-pay

## 2019-03-11 DIAGNOSIS — N400 Enlarged prostate without lower urinary tract symptoms: Secondary | ICD-10-CM | POA: Diagnosis not present

## 2019-03-11 DIAGNOSIS — Z87442 Personal history of urinary calculi: Secondary | ICD-10-CM | POA: Diagnosis not present

## 2019-04-07 ENCOUNTER — Other Ambulatory Visit: Payer: Self-pay

## 2019-04-07 ENCOUNTER — Telehealth: Payer: Self-pay | Admitting: Physician Assistant

## 2019-04-07 ENCOUNTER — Other Ambulatory Visit: Payer: BLUE CROSS/BLUE SHIELD | Admitting: *Deleted

## 2019-04-07 DIAGNOSIS — E785 Hyperlipidemia, unspecified: Secondary | ICD-10-CM | POA: Diagnosis not present

## 2019-04-07 LAB — LIPID PANEL
Chol/HDL Ratio: 7.1 ratio — ABNORMAL HIGH (ref 0.0–5.0)
Cholesterol, Total: 243 mg/dL — ABNORMAL HIGH (ref 100–199)
HDL: 34 mg/dL — ABNORMAL LOW (ref >39)
LDL Calculated: 134 mg/dL — ABNORMAL HIGH (ref 0–99)
Triglycerides: 375 mg/dL — ABNORMAL HIGH (ref 0–149)
VLDL Cholesterol Cal: 75 mg/dL — ABNORMAL HIGH (ref 5–40)

## 2019-04-07 LAB — COMPREHENSIVE METABOLIC PANEL
ALT: 27 IU/L (ref 0–44)
AST: 20 IU/L (ref 0–40)
Albumin/Globulin Ratio: 2.1 (ref 1.2–2.2)
Albumin: 4.9 g/dL (ref 4.0–5.0)
Alkaline Phosphatase: 85 IU/L (ref 39–117)
BUN/Creatinine Ratio: 19 (ref 9–20)
BUN: 15 mg/dL (ref 6–24)
Bilirubin Total: 0.4 mg/dL (ref 0.0–1.2)
CO2: 22 mmol/L (ref 20–29)
Calcium: 9.4 mg/dL (ref 8.7–10.2)
Chloride: 103 mmol/L (ref 96–106)
Creatinine, Ser: 0.8 mg/dL (ref 0.76–1.27)
GFR calc Af Amer: 123 mL/min/{1.73_m2} (ref >59)
GFR calc non Af Amer: 106 mL/min/{1.73_m2} (ref >59)
Globulin, Total: 2.3 g/dL (ref 1.5–4.5)
Glucose: 110 mg/dL — ABNORMAL HIGH (ref 65–99)
Potassium: 4.8 mmol/L (ref 3.5–5.2)
Sodium: 142 mmol/L (ref 134–144)
Total Protein: 7.2 g/dL (ref 6.0–8.5)

## 2019-04-07 NOTE — Telephone Encounter (Signed)
New message    Pt came in this morning for lab work. He wanted to let Lorin Picket know that since he stopped his cholesterol medication at his last visit on 02.14.20, he has not had any diahherra. He said he stopped it because of that. He isn't sure if after todays lab work he will be put back on anything but he would not like the same medication.

## 2019-04-08 ENCOUNTER — Other Ambulatory Visit: Payer: Self-pay | Admitting: Physician Assistant

## 2019-04-08 ENCOUNTER — Telehealth: Payer: Self-pay

## 2019-04-08 NOTE — Telephone Encounter (Signed)
-----   Message from Scott T Weaver, PA-C sent at 04/08/2019  8:20 AM EDT ----- Lipids uncontrolled.  LDL too high.  Triglycerides elevated.  LFTs, Creatinine, K+ normal.  Notes indicate he had side effects to Lipitor and stopped it in Feb. 2020 Recommendations:  - DC Lipitor - I have removed from his list.  - Start Rosuvastatin 20 mg once daily - pls send in Rx to his pharmacy   - Arrange Lipids and LFTs in 3 mos  - Call if side effects occur Scott Weaver, PA-C    04/08/2019 8:16 AM 

## 2019-04-08 NOTE — Telephone Encounter (Signed)
See lab results from 04/07/2019 [name], PA-C    04/08/2019 8:21 AM

## 2019-04-08 NOTE — Telephone Encounter (Signed)
Notes recorded by Sigurd Sos, RN on 04/08/2019 at 8:36 AM EDT [id number] 5/15 ------

## 2019-04-11 ENCOUNTER — Telehealth: Payer: Self-pay

## 2019-04-11 DIAGNOSIS — Z79899 Other long term (current) drug therapy: Secondary | ICD-10-CM

## 2019-04-11 DIAGNOSIS — E785 Hyperlipidemia, unspecified: Secondary | ICD-10-CM

## 2019-04-11 MED ORDER — ROSUVASTATIN CALCIUM 20 MG PO TABS: 20.0000 mg | ORAL_TABLET | Freq: Every day | ORAL | 3 refills | Status: DC

## 2019-04-11 NOTE — Telephone Encounter (Signed)
Notes recorded by Sigurd Sos, RN on 04/11/2019 at 8:59 AM EDT The patient has been notified of the result and verbalized understanding. Crestor prescribed/labs scheduled. All questions (if any) were answered. Sigurd Sos, RN 04/11/2019 8:58 AM

## 2019-04-11 NOTE — Telephone Encounter (Signed)
-----   Message from Beatrice Lecher, PA-C sent at 04/08/2019  8:20 AM EDT ----- Lipids uncontrolled.  LDL too high.  Triglycerides elevated.  LFTs, Creatinine, K+ normal.  Notes indicate he had side effects to Lipitor and stopped it in Feb. 2020 Recommendations:  - DC Lipitor - I have removed from his list.  - Start Rosuvastatin 20 mg once daily - pls send in Rx to his pharmacy   - Arrange Lipids and LFTs in 3 mos  - Call if side effects occur [name], PA-C    04/08/2019 8:16 AM

## 2019-07-12 ENCOUNTER — Other Ambulatory Visit: Payer: Self-pay

## 2019-07-12 ENCOUNTER — Other Ambulatory Visit: Payer: BC Managed Care – PPO

## 2019-07-12 DIAGNOSIS — E785 Hyperlipidemia, unspecified: Secondary | ICD-10-CM

## 2019-07-12 DIAGNOSIS — Z79899 Other long term (current) drug therapy: Secondary | ICD-10-CM | POA: Diagnosis not present

## 2019-07-12 LAB — HEPATIC FUNCTION PANEL
ALT: 19 IU/L (ref 0–44)
AST: 19 IU/L (ref 0–40)
Albumin: 4.7 g/dL (ref 4.0–5.0)
Alkaline Phosphatase: 89 IU/L (ref 39–117)
Bilirubin Total: 0.5 mg/dL (ref 0.0–1.2)
Bilirubin, Direct: 0.12 mg/dL (ref 0.00–0.40)
Total Protein: 6.9 g/dL (ref 6.0–8.5)

## 2019-07-12 LAB — LIPID PANEL
Chol/HDL Ratio: 4.4 ratio (ref 0.0–5.0)
Cholesterol, Total: 140 mg/dL (ref 100–199)
HDL: 32 mg/dL — ABNORMAL LOW (ref >39)
LDL Calculated: 64 mg/dL (ref 0–99)
Triglycerides: 221 mg/dL — ABNORMAL HIGH (ref 0–149)
VLDL Cholesterol Cal: 44 mg/dL — ABNORMAL HIGH (ref 5–40)

## 2019-12-09 ENCOUNTER — Other Ambulatory Visit: Payer: Self-pay

## 2019-12-09 MED ORDER — METOPROLOL TARTRATE 50 MG PO TABS: 50.0000 mg | ORAL_TABLET | Freq: Two times a day (BID) | ORAL | 0 refills | Status: DC

## 2020-01-14 ENCOUNTER — Other Ambulatory Visit: Payer: Self-pay | Admitting: Physician Assistant

## 2020-02-09 ENCOUNTER — Other Ambulatory Visit: Payer: Self-pay | Admitting: Physician Assistant

## 2020-02-22 ENCOUNTER — Other Ambulatory Visit: Payer: Self-pay | Admitting: Physician Assistant

## 2020-03-09 ENCOUNTER — Other Ambulatory Visit: Payer: Self-pay | Admitting: Cardiovascular Disease

## 2020-03-09 MED ORDER — METOPROLOL TARTRATE 50 MG PO TABS: 50.0000 mg | ORAL_TABLET | Freq: Two times a day (BID) | ORAL | 0 refills | Status: DC

## 2020-03-09 NOTE — Addendum Note (Signed)
Addended by: Ernie Hew on: 03/09/2020 12:24 PM   Modules accepted: Orders

## 2020-03-12 ENCOUNTER — Other Ambulatory Visit: Payer: Self-pay | Admitting: Cardiovascular Disease

## 2020-03-13 NOTE — Telephone Encounter (Signed)
Outpatient Medication Detail   Disp Refills Start End   metoprolol tartrate (LOPRESSOR) 50 MG tablet 60 tablet 0 03/09/2020    Sig - Route: Take 1 tablet (50 mg total) by mouth 2 (two) times daily with a meal. Please schedule annual appt for refills. [phone]. 1st attempt. - Oral   Sent to pharmacy as: metoprolol tartrate (LOPRESSOR) 50 MG tablet   E-Prescribing Status: Receipt confirmed by pharmacy (03/09/2020 12:24 PM EDT)   Pharmacy  [facility] [address], [place] - [address]

## 2020-03-26 NOTE — Progress Notes (Signed)
Cardiology Office Note:    Date:  03/27/2020   ID:  Monika Salk, DOB [birth year], MRN [id number]  PCP:  Carlena Hurl, PA-C  Cardiologist:  Sherren Mocha, MD   Electrophysiologist:  None   Referring MD: Carlena Hurl, PA-C   Chief Complaint:  Follow-up (CAD)    Patient Profile:    Hunter Gomez is a 49 y.o. male with:   Coronary artery disease   S/p NSTEMI in 2016 >> s/p CABG  Hx of gallstone pancreatitis s/p ERCP, cholecystectomy   Hyperlipidemia   Hypertension   Prior CV studies: Echo 01/10/15 Mild LVH, EF 30-16%, grade 2 diastolic dysfunction, trivial AI, trivial TR  Cardiac Cath 12/18/14 LM: Distal 95% extending into ostium of LAD and LCx LAD: Ostial 90%, mid 30% LCx: ostial 80-90%, proximal 50% RCA: Ostial PDA 80%, ostial PLA 75% EF 55-65% >> Emergent CABG  Carotid US 12/18/14 Bilateral ICA 1-39%  History of Present Illness:    Mr. Tingler was last seen in clinic in 12/2018.  He returns for annual follow up.  He is here alone.  He is overall doing well.  He has retired from [employer] and is enjoying time with his kids.  He is looking forward to attending [employer] games this year.  He has received both COVID-19 vaccines.  He has not had chest pain, shortness of breath, syncope, leg swelling.  He does have frequent diarrhea.  We switched his Atorvastatin to Rosuvastatin last year b/c of this.  While off statin Rx, his diarrhea resolved.  Since starting Rosuvastatin, it has recurred.    Past Medical History:  Diagnosis Date   CAD (coronary artery disease)    a.  NSTEMI (1/16):  LHC - dLM 95, oLAD 90, mLAD 30, oCFX 80-90, pCFX 50, oPDA 80, oPLA 75, EF 55-65% >> urgent CABG   Hepatic steatosis    HLD (hyperlipidemia)    Hx of echocardiogram    Echo (2/16):  Mild LVH, EF 55-60%, Gr 2 DD, trivial AI/TR, no effusion   Hypertension 10/2012   Muscle weakness 1997   prior seizure like activity, but none since; prior neurology eval, prior normal EEG  1997   Obesity    s/p Lap Band surgery   Vision problem    prior use of glasses, not currently    Current Medications: Current Meds  Medication Sig   aspirin 81 MG tablet Take 81 mg by mouth daily.   lisinopril (ZESTRIL) 10 MG tablet Take 1 tablet (10 mg total) by mouth daily.   metoprolol tartrate (LOPRESSOR) 50 MG tablet Take 1 tablet (50 mg total) by mouth 2 (two) times daily with a meal.   nitroGLYCERIN (NITROSTAT) 0.4 MG SL tablet Place 1 tablet (0.4 mg total) under the tongue every 5 (five) minutes as needed for chest pain.   ondansetron (ZOFRAN) 4 MG tablet Take 1 tablet (4 mg total) by mouth every 8 (eight) hours as needed for nausea or vomiting.   rosuvastatin (CRESTOR) 20 MG tablet Take 1 tablet (20 mg total) by mouth daily.   tamsulosin (FLOMAX) 0.4 MG CAPS capsule TAKE 1 CAPSULE(0.4 MG) BY MOUTH DAILY AFTER BREAKFAST   [DISCONTINUED] lisinopril (ZESTRIL) 10 MG tablet Take 1 tablet (10 mg total) by mouth daily. Please schedule overdue follow up appt for further refills. [phone]   [DISCONTINUED] metoprolol tartrate (LOPRESSOR) 50 MG tablet Take 1 tablet (50 mg total) by mouth 2 (two) times daily with a meal. Please schedule annual appt for refills. [phone]. 1st  attempt.   [DISCONTINUED] rosuvastatin (CRESTOR) 20 MG tablet Take 1 tablet (20 mg total) by mouth daily.     Allergies:   Patient has no known allergies.   Social History   Tobacco Use   Smoking status: Never Smoker   Smokeless tobacco: Never Used  Substance Use Topics   Alcohol use: No   Drug use: No     Family Hx: The patient's family history includes Cancer in his maternal grandfather and paternal grandmother; Diabetes in his mother; Heart disease (age of onset: 31) in his father; Heart disease (age of onset: 71) in his mother; Hyperlipidemia in his brother, father, and mother; Hypertension in his brother, father, and mother; Peripheral vascular disease in his mother; Stroke in his  father. There is no history of Heart attack.  ROS   EKGs/Labs/Other Test Reviewed:    EKG:  EKG is   ordered today.  The ekg ordered today demonstrates sinus bradycardia, HR 57, normal axis, J-point elevation, QTC 397, no change since prior tracing  Recent Labs: 04/07/2019: BUN 15; Creatinine, Ser 0.80; Potassium 4.8; Sodium 142 07/12/2019: ALT 19   Recent Lipid Panel Lab Results  Component Value Date/Time   CHOL 140 07/12/2019 10:05 AM   TRIG 221 (H) 07/12/2019 10:05 AM   HDL 32 (L) 07/12/2019 10:05 AM   CHOLHDL 4.4 07/12/2019 10:05 AM   CHOLHDL 3.0 09/09/2017 11:04 AM   LDLCALC 64 07/12/2019 10:05 AM   LDLCALC 59 09/09/2017 11:04 AM    Physical Exam:    VS:  BP 138/90    Pulse (!) 57    Ht 5' 8"  (1.727 m)    Wt 248 lb (112.5 kg)    SpO2 98%    BMI 37.71 kg/m     Wt Readings from Last 3 Encounters:  03/27/20 248 lb (112.5 kg)  01/07/19 240 lb 6.4 oz (109 kg)  12/15/17 226 lb 12.8 oz (102.9 kg)     Constitutional:      Appearance: Healthy appearance. Not in distress.  Neck:     Thyroid: No thyromegaly.     Vascular: JVD normal.  Pulmonary:     Breath sounds: No wheezing. No rales.  Cardiovascular:     Normal rate. Regular rhythm. Normal S1. Normal S2.     Murmurs: There is no murmur.  Edema:    Peripheral edema absent.  Abdominal:     Palpations: Abdomen is soft. There is no hepatomegaly.  Skin:    General: Skin is warm and dry.  Neurological:     General: No focal deficit present.     Mental Status: Alert and oriented to person, place and time.       ASSESSMENT & PLAN:    1. Coronary artery disease involving native coronary artery of native heart without angina pectoris History of non-STEMI in 2016 followed by CABG.  He is doing well without anginal symptoms.  Continue aspirin, statin, beta-blocker.  Follow-up 1 year.  2. Essential hypertension, benign Blood pressure above target.  However, he ran out of lisinopril a few days ago.  We will renew all of  his medications.    3. Mixed hyperlipidemia Recent LDL optimal.  Triglycerides were somewhat elevated.  He is due for repeat fasting c-Met, lipids today.  He continues to have issues with diarrhea related to statin therapy.  He has a history of cholecystectomy.  I am concerned he may have similar symptoms with ezetimibe.  He may be a good candidate for PCSK9 inhibitors  versus bempedoic acid.  He notes that his drug coverage with his insurance is not great.  I will refer him to our lipid clinic to see what alternatives we can come up with to manage his lipids.   Dispo:  Return in about 1 year (around 03/27/2021) for Routine Follow Up, w/ Dr. Burt Knack, or Richardson Dopp, PA-C, in person.   Medication Adjustments/Labs and Tests Ordered: Current medicines are reviewed at length with the patient today.  Concerns regarding medicines are outlined above.  Tests Ordered: Orders Placed This Encounter  Procedures   Comprehensive metabolic panel   Lipid panel   AMB Referral to [facility]   EKG 12-Lead   Medication Changes: Meds ordered this encounter  Medications   lisinopril (ZESTRIL) 10 MG tablet    Sig: Take 1 tablet (10 mg total) by mouth daily.    Dispense:  90 tablet    Refill:  3    Please schedule overdue follow up appt for further refills. [phone] 1st attempt   metoprolol tartrate (LOPRESSOR) 50 MG tablet    Sig: Take 1 tablet (50 mg total) by mouth 2 (two) times daily with a meal.    Dispense:  180 tablet    Refill:  3   rosuvastatin (CRESTOR) 20 MG tablet    Sig: Take 1 tablet (20 mg total) by mouth daily.    Dispense:  90 tablet    Refill:  3    Signed, Richardson Dopp, PA-C  03/27/2020 10:30 AM    [facility] [place], [place], [place]  [address] Phone: [phone]; Fax: [phone]

## 2020-03-27 ENCOUNTER — Ambulatory Visit (INDEPENDENT_AMBULATORY_CARE_PROVIDER_SITE_OTHER): Payer: 59 | Admitting: Physician Assistant

## 2020-03-27 ENCOUNTER — Encounter: Payer: Self-pay | Admitting: Physician Assistant

## 2020-03-27 ENCOUNTER — Other Ambulatory Visit: Payer: Self-pay

## 2020-03-27 VITALS — BP 138/90 | HR 57 | Ht 68.0 in | Wt 248.0 lb

## 2020-03-27 DIAGNOSIS — E782 Mixed hyperlipidemia: Secondary | ICD-10-CM | POA: Diagnosis not present

## 2020-03-27 DIAGNOSIS — I10 Essential (primary) hypertension: Secondary | ICD-10-CM

## 2020-03-27 DIAGNOSIS — I2510 Atherosclerotic heart disease of native coronary artery without angina pectoris: Secondary | ICD-10-CM

## 2020-03-27 LAB — LIPID PANEL
Chol/HDL Ratio: 4.1 ratio (ref 0.0–5.0)
Cholesterol, Total: 139 mg/dL (ref 100–199)
HDL: 34 mg/dL — ABNORMAL LOW (ref >39)
LDL Chol Calc (NIH): 77 mg/dL (ref 0–99)
Triglycerides: 163 mg/dL — ABNORMAL HIGH (ref 0–149)
VLDL Cholesterol Cal: 28 mg/dL (ref 5–40)

## 2020-03-27 LAB — COMPREHENSIVE METABOLIC PANEL
ALT: 24 IU/L (ref 0–44)
AST: 20 IU/L (ref 0–40)
Albumin/Globulin Ratio: 1.9 (ref 1.2–2.2)
Albumin: 4.8 g/dL (ref 4.0–5.0)
Alkaline Phosphatase: 95 IU/L (ref 39–117)
BUN/Creatinine Ratio: 15 (ref 9–20)
BUN: 13 mg/dL (ref 6–24)
Bilirubin Total: 0.4 mg/dL (ref 0.0–1.2)
CO2: 25 mmol/L (ref 20–29)
Calcium: 9.6 mg/dL (ref 8.7–10.2)
Chloride: 103 mmol/L (ref 96–106)
Creatinine, Ser: 0.89 mg/dL (ref 0.76–1.27)
GFR calc Af Amer: 117 mL/min/{1.73_m2} (ref >59)
GFR calc non Af Amer: 101 mL/min/{1.73_m2} (ref >59)
Globulin, Total: 2.5 g/dL (ref 1.5–4.5)
Glucose: 112 mg/dL — ABNORMAL HIGH (ref 65–99)
Potassium: 4.9 mmol/L (ref 3.5–5.2)
Sodium: 139 mmol/L (ref 134–144)
Total Protein: 7.3 g/dL (ref 6.0–8.5)

## 2020-03-27 MED ORDER — ROSUVASTATIN CALCIUM 20 MG PO TABS: 20.0000 mg | ORAL_TABLET | Freq: Every day | ORAL | 3 refills | Status: DC

## 2020-03-27 MED ORDER — METOPROLOL TARTRATE 50 MG PO TABS: 50.0000 mg | ORAL_TABLET | Freq: Two times a day (BID) | ORAL | 3 refills | Status: DC

## 2020-03-27 MED ORDER — LISINOPRIL 10 MG PO TABS: 10.0000 mg | ORAL_TABLET | Freq: Every day | ORAL | 3 refills | Status: DC

## 2020-03-27 NOTE — Patient Instructions (Addendum)
Medication Instructions:  Your physician recommends that you continue on your current medications as directed. Please refer to the Current Medication list given to you today.  *If you need a refill on your cardiac medications before your next appointment, please call your pharmacy*   Lab Work: TODAY:  CMET & LIPID  If you have labs (blood work) drawn today and your tests are completely normal, you will receive your results only by: Marland Kitchen MyChart Message (if you have MyChart) OR . A paper copy in the mail If you have any lab test that is abnormal or we need to change your treatment, we will call you to review the results.   Testing/Procedures: None ordered   You have been referred to LIPID CLINIC.  Follow-Up: At [facility], you and your health needs are our priority.  As part of our continuing mission to provide you with exceptional heart care, we have created designated Provider Care Teams.  These Care Teams include your primary Cardiologist (physician) and Advanced Practice Providers (APPs -  Physician Assistants and Nurse Practitioners) who all work together to provide you with the care you need, when you need it.  We recommend signing up for the patient portal called "MyChart".  Sign up information is provided on this After Visit Summary.  MyChart is used to connect with patients for Virtual Visits (Telemedicine).  Patients are able to view lab/test results, encounter notes, upcoming appointments, etc.  Non-urgent messages can be sent to your provider as well.   To learn more about what you can do with MyChart, go to [email or web address].    Your next appointment:   12 month(s)  The format for your next appointment:   In Person  Provider:   You may see Tonny Bollman, MD or one of the following Advanced Practice Providers on your designated Care Team:    [name], PA-C  Chelsea Aus, [place]    Other Instructions

## 2020-04-09 ENCOUNTER — Ambulatory Visit (INDEPENDENT_AMBULATORY_CARE_PROVIDER_SITE_OTHER): Payer: 59 | Admitting: Pharmacist

## 2020-04-09 ENCOUNTER — Other Ambulatory Visit: Payer: Self-pay

## 2020-04-09 DIAGNOSIS — E782 Mixed hyperlipidemia: Secondary | ICD-10-CM

## 2020-04-09 NOTE — Progress Notes (Signed)
Patient ID: Hunter Gomez                 DOB: 1971-10-24                    MRN: [id number]     HPI: Hunter Gomez is a 49 y.o. male patient of Dr. Earmon Phoenix referred to lipid clinic by [name], PA. PMH is significant for CAD s.p NSTEMI in 2016, s/p CABG, gallstone pancreatitis, HLD, HTN and HFpEF. Patient switched his Atorvastatin to Rosuvastatin last year b/c of diarrhea. While off statin Rx, his diarrhea resolved. Since starting Rosuvastatin, it has recurred.    Patient presents to lipid clinic to discuss medication options. Unfortunately his plan has a high pharmacy deducible. He usually uses coupons for his medications so it appears he hasn't paid anything towards this.  Patient reports that he has not exercised as much as he would like. Was going to [employer] but found it hard to exercise with a mask on. Bought a new bike and is starting to ride more. Also walks with his wife.   Current Medications: rosuvastatin 20 Intolerances: atorvastatin (diarrhea), rosuvastatin (diarrhea) Risk Factors: CAD, HTN LDL goal: <70, consider less than 55 due to premature CAD  Diet: breakfast: alternates -cereal (special K or raisin bran) and toast, 2 eggs and toast Lunch: lean cuisin steam bowls- soup- 1/2 PB sandwich Dinner:samon, pork chops (baked), TV dinners, eats out 1-2 days a week, limited fried food.   Exercise: walks 3 days a week (1.5 miles-30 min) starting to ride his bike more. Hopes to get back to gym once mask mandates are lifted.  Family History: The patient's family history includes Cancer in his maternal grandfather and paternal grandmother; Diabetes in his mother; Heart disease (age of onset: 31) in his father; Heart disease (age of onset: 13) in his mother; Hyperlipidemia in his brother, father, and mother; Hypertension in his brother, father, and mother; Peripheral vascular disease in his mother; Stroke in his father. There is no history of Heart attack.  Social History: No  ETOH, No tobacco  Labs: 03/27/20 TC 139, TG 163, HDL 34, LDL 77 (rosuvastatin 20mg )  Past Medical History:  Diagnosis Date  . CAD (coronary artery disease)    a.  NSTEMI (1/16):  LHC - dLM 95, oLAD 90, mLAD 30, oCFX 80-90, pCFX 50, oPDA 80, oPLA 75, EF 55-65% >> urgent CABG  . Hepatic steatosis   . HLD (hyperlipidemia)   . Hx of echocardiogram    Echo (2/16):  Mild LVH, EF 55-60%, Gr 2 DD, trivial AI/TR, no effusion  . Hypertension 10/2012  . Muscle weakness 1997   prior seizure like activity, but none since; prior neurology eval, prior normal EEG 1997  . Obesity    s/p Lap Band surgery  . Vision problem    prior use of glasses, not currently    Current Outpatient Medications on File Prior to Visit  Medication Sig Dispense Refill  . aspirin 81 MG tablet Take 81 mg by mouth daily.    11/2012 lisinopril (ZESTRIL) 10 MG tablet Take 1 tablet (10 mg total) by mouth daily. 90 tablet 3  . metoprolol tartrate (LOPRESSOR) 50 MG tablet Take 1 tablet (50 mg total) by mouth 2 (two) times daily with a meal. 180 tablet 3  . nitroGLYCERIN (NITROSTAT) 0.4 MG SL tablet Place 1 tablet (0.4 mg total) under the tongue every 5 (five) minutes as needed for chest pain. 25 tablet 3  .  ondansetron (ZOFRAN) 4 MG tablet Take 1 tablet (4 mg total) by mouth every 8 (eight) hours as needed for nausea or vomiting. 20 tablet 0  . rosuvastatin (CRESTOR) 20 MG tablet Take 1 tablet (20 mg total) by mouth daily. 90 tablet 3  . tamsulosin (FLOMAX) 0.4 MG CAPS capsule TAKE 1 CAPSULE(0.4 MG) BY MOUTH DAILY AFTER BREAKFAST 30 capsule 1   No current facility-administered medications on file prior to visit.    No Known Allergies  Assessment/Plan:  1. Hyperlipidemia - LDL on rosuvastatin is close to goal of <70 (prefer goal of <55). However patient is having diarrhea. Due to the fact that his diarrhea stopped when he was off of statin for 3 months and then started again when he started rosuvastatin, it is likely it is from  medication. He has a history of cholcysitis making zetia a poor option. Zetia is also more likely than statins to cause diarrhea. PCSK9i would be a good option for patient, but cost might be an issue. We can use a copay card, but depending on the covered of his plan, we may max out the amount of copay card before the end of the year (believe the limit is 2500/year). We may need to do 1/2 the year on Repatha and the other half on Praluent. I have advised patient to stop taking the rosuvastatin 20mg . When his diarrhea resolves, we will give him 2 samples of Repatha to see if he tolerates. If he does ok, then we will submit prior authorization. Back up plan would be to try a different statin.  Patient was educated to increase exercise to 150 min per week of moderate aerobic exercise with 2 of resistance training. Also advised to avoid fried foods and use whole grain in stead of white bread.   Thank you,  Ramond Dial, Pharm.D, BCPS, CPP [place]  [address] [place], [place] [address]  Phone: [phone]; Fax: [phone]

## 2020-04-09 NOTE — Patient Instructions (Addendum)
It was a pleasure to meet you today!  Please stop taking rosuvastatin. Call us once diarrhea resolves.  We will give you sample of Praluent or Repatha. If you tolerate, then we will submit a prior authorization to your insurance company and use copay card.  Try to limit fried foods and use hole grain/whole wheat products  Call us at [phone] with any questions or concerns

## 2020-04-10 DIAGNOSIS — I25119 Atherosclerotic heart disease of native coronary artery with unspecified angina pectoris: Secondary | ICD-10-CM

## 2020-04-10 NOTE — Telephone Encounter (Signed)
Please schedule an exercise Myoview  I have placed the order. He will need a follow up after the Myoview with me or Dr. Excell Seltzer. It can be a virtual visit if he is ok with that. [name], PA-C    04/10/2020 5:03 PM

## 2020-04-14 ENCOUNTER — Other Ambulatory Visit (HOSPITAL_COMMUNITY)
Admission: RE | Admit: 2020-04-14 | Discharge: 2020-04-14 | Disposition: A | Discharge status: Home / Self Care | Payer: 59 | Source: Ambulatory Visit | Attending: Physician Assistant | Admitting: Physician Assistant

## 2020-04-14 DIAGNOSIS — Z20822 Contact with and (suspected) exposure to covid-19: Secondary | ICD-10-CM | POA: Insufficient documentation

## 2020-04-14 DIAGNOSIS — Z01812 Encounter for preprocedural laboratory examination: Secondary | ICD-10-CM | POA: Insufficient documentation

## 2020-04-14 LAB — SARS CORONAVIRUS 2 (TAT 6-24 HRS): SARS Coronavirus 2: NEGATIVE

## 2020-04-17 ENCOUNTER — Telehealth: Payer: Self-pay | Admitting: Pharmacist

## 2020-04-17 ENCOUNTER — Telehealth (HOSPITAL_COMMUNITY): Payer: Self-pay

## 2020-04-17 ENCOUNTER — Encounter (HOSPITAL_COMMUNITY): Payer: Self-pay

## 2020-04-17 NOTE — Telephone Encounter (Signed)
Patient called and left VM that his diarrhea has stopped after stopping rosuvastatin. I have submitted PA for Praluent (copay card has a $3500 limit) Awaiting response. Patient made aware of the plan.

## 2020-04-17 NOTE — Telephone Encounter (Signed)
Detailed instructions left on the patient's answering machine. Asked to call back with any questions. My chart letter was also sent. S.Infantof Villagomez EMTP 

## 2020-04-18 NOTE — Telephone Encounter (Addendum)
PA approved through 04/18/21. Copay card activated. Called patient to confirm which pharmacy he would like Rx sent to. Left VM

## 2020-04-18 NOTE — Addendum Note (Signed)
Addended by: Malena Peer D on: 04/18/2020 03:34 PM   Modules accepted: Orders

## 2020-04-19 ENCOUNTER — Other Ambulatory Visit: Payer: Self-pay

## 2020-04-19 ENCOUNTER — Ambulatory Visit (HOSPITAL_COMMUNITY): Payer: 59 | Attending: Cardiology

## 2020-04-19 ENCOUNTER — Encounter: Payer: Self-pay | Admitting: Physician Assistant

## 2020-04-19 DIAGNOSIS — I25119 Atherosclerotic heart disease of native coronary artery with unspecified angina pectoris: Secondary | ICD-10-CM

## 2020-04-19 LAB — MYOCARDIAL PERFUSION IMAGING
LV dias vol: 114 mL (ref 62–150)
LV sys vol: 53 mL
Peak HR: 120 {beats}/min
Rest HR: 51 {beats}/min
SDS: 0
SRS: 0
SSS: 0
TID: 0.93

## 2020-04-19 MED ORDER — TECHNETIUM TC 99M TETROFOSMIN IV KIT
10.7000 | PACK | Freq: Once | INTRAVENOUS | Status: AC | PRN
  Administered 2020-04-19: 10.7 via INTRAVENOUS
  Filled 2020-04-19: qty 11

## 2020-04-19 MED ORDER — TECHNETIUM TC 99M TETROFOSMIN IV KIT
30.8000 | PACK | Freq: Once | INTRAVENOUS | Status: AC | PRN
  Administered 2020-04-19: 30.8 via INTRAVENOUS
  Filled 2020-04-19: qty 31

## 2020-04-19 MED ORDER — REGADENOSON 0.4 MG/5ML IV SOLN
0.4000 mg | Freq: Once | INTRAVENOUS | Status: AC
  Administered 2020-04-19: 0.4 mg via INTRAVENOUS

## 2020-04-25 NOTE — Telephone Encounter (Signed)
Called patient and left VM for him to call back. Need to confirm which pharmacy he would like rx sent to

## 2020-04-26 ENCOUNTER — Telehealth: Payer: Self-pay | Admitting: Physician Assistant

## 2020-04-26 ENCOUNTER — Other Ambulatory Visit: Payer: Self-pay

## 2020-04-26 MED ORDER — LISINOPRIL 10 MG PO TABS: 10.0000 mg | ORAL_TABLET | Freq: Every day | ORAL | 3 refills | Status: DC

## 2020-04-26 MED ORDER — PRALUENT 75 MG/ML ~~LOC~~ SOAJ: 1.0000 "pen " | SUBCUTANEOUS | 11 refills | Status: DC

## 2020-04-26 NOTE — Telephone Encounter (Signed)
  *  STAT* If patient is at the pharmacy, call can be transferred to refill team.   1. Which medications need to be refilled? (please list name of each medication and dose if known) lisinopril (ZESTRIL) 10 MG tablet, metoprolol tartrate (LOPRESSOR) 50 MG tablet  2. Which pharmacy/location (including street and city if local pharmacy) is medication to be sent to? [facility], [address] [place], [place] [address]  3. Do they need a 30 day or 90 day supply? 7 - 10 days supply  [place] is [place] of his dad. He needs emergency refill

## 2020-04-26 NOTE — Addendum Note (Signed)
Addended by: Jacqlyn Krauss on: 04/26/2020 11:46 AM   Modules accepted: Orders

## 2020-04-26 NOTE — Addendum Note (Signed)
Addended by: Malena Peer D on: 04/26/2020 09:36 AM   Modules accepted: Orders

## 2020-05-28 NOTE — Progress Notes (Signed)
Cardiology Office Note:    Date:  05/29/2020   ID:  Hunter Gomez, DOB [birth year], MRN [id number]  PCP:  Jac Canavan, PA-C  Cardiologist:  Tonny Bollman, MD  Electrophysiologist:  None   Referring MD: Jac Canavan, PA-C   Chief Complaint:  Follow-up (CAD; recent stress test)    Patient Profile:    Hunter Gomez is a 49 y.o. male with:   Coronary artery disease  ? S/p NSTEMI in 2016 >> s/p CABG  Hx of gallstone pancreatitis s/p ERCP, cholecystectomy   Hyperlipidemia   Hypertension   Prior CV studies: Myoview 04/19/2020 EF 54, no ischemia or infarction, low risk  Echo 01/10/15 Mild LVH, EF 55-60%, grade 2 diastolic dysfunction, trivial AI, trivial TR  Cardiac Cath 12/18/14 LM: Distal 95% extending into ostium of LAD and LCx LAD: Ostial 90%, mid 30% LCx: ostial 80-90%, proximal 50% RCA: Ostial PDA 80%, ostial PLA 75% EF 55-65% >> Emergent CABG  Carotid US 12/18/14 Bilateral ICA 1-39%  History of Present Illness:    Hunter Gomez was last seen in clinic in May 2021.  He then sent a message through MyChart after his appointment with symptoms of chest discomfort with exertion.  An exercise Myoview was arranged.  This was low risk and negative for ischemia or infarction.  He has seen our lipid clinic due to issues with statin therapy (diarrhea).  He has been started on Alirocumab.  He returns for follow-up.  He is here alone.  He is no longer having chest symptoms.  He does note that his chest symptoms went on for several weeks and typically occurred if he did any type of activity, especially outside.  He did not have to take nitroglycerin.  He is currently very active without exertional chest discomfort, shortness of breath.  He has not had syncope, orthopnea or lower extremity swelling.  Past Medical History:  Diagnosis Date  . CAD (coronary artery disease)    a.  NSTEMI (1/16):  LHC - dLM 95, oLAD 90, mLAD 30, oCFX 80-90, pCFX 50, oPDA 80, oPLA 75, EF 55-65% >>  urgent CABG// Myoview 03/2020: EF 54, no ischemia or infarction; low risk  . Hepatic steatosis   . HLD (hyperlipidemia)   . Hx of echocardiogram    Echo (2/16):  Mild LVH, EF 55-60%, Gr 2 DD, trivial AI/TR, no effusion  . Hypertension 10/2012  . Muscle weakness 1997   prior seizure like activity, but none since; prior neurology eval, prior normal EEG 1997  . Obesity    s/p Lap Band surgery  . Vision problem    prior use of glasses, not currently    Current Medications: Current Meds  Medication Sig  . Alirocumab (PRALUENT) 75 MG/ML SOAJ Inject 1 pen into the skin every 14 (fourteen) days.  Marland Kitchen aspirin 81 MG tablet Take 81 mg by mouth daily.  Marland Kitchen lisinopril (ZESTRIL) 20 MG tablet Take 1 tablet (20 mg total) by mouth daily.  . metoprolol tartrate (LOPRESSOR) 50 MG tablet Take 1 tablet (50 mg total) by mouth 2 (two) times daily with a meal.  . nitroGLYCERIN (NITROSTAT) 0.4 MG SL tablet Place 1 tablet (0.4 mg total) under the tongue every 5 (five) minutes as needed for chest pain.  Marland Kitchen ondansetron (ZOFRAN) 4 MG tablet Take 1 tablet (4 mg total) by mouth every 8 (eight) hours as needed for nausea or vomiting.  . tamsulosin (FLOMAX) 0.4 MG CAPS capsule TAKE 1 CAPSULE(0.4 MG) BY MOUTH DAILY AFTER BREAKFAST  . [  DISCONTINUED] lisinopril (ZESTRIL) 10 MG tablet Take 1 tablet (10 mg total) by mouth daily.  . [DISCONTINUED] nitroGLYCERIN (NITROSTAT) 0.4 MG SL tablet Place 1 tablet (0.4 mg total) under the tongue every 5 (five) minutes as needed for chest pain.     Allergies:   Patient has no known allergies.   Social History   Tobacco Use  . Smoking status: Never Smoker  . Smokeless tobacco: Never Used  Vaping Use  . Vaping Use: Never used  Substance Use Topics  . Alcohol use: No  . Drug use: No     Family Hx: The patient's family history includes Cancer in his maternal grandfather and paternal grandmother; Diabetes in his mother; Heart disease (age of onset: 82) in his father; Heart disease  (age of onset: 43) in his mother; Hyperlipidemia in his brother, father, and mother; Hypertension in his brother, father, and mother; Peripheral vascular disease in his mother; Stroke in his father. There is no history of Heart attack.  Review of Systems  Gastrointestinal: Negative for diarrhea.     EKGs/Labs/Other Test Reviewed:    EKG:  EKG is notn ordered today.  The ekg ordered today demonstrates /a  Recent Labs: 03/27/2020: ALT 24; BUN 13; Creatinine, Ser 0.89; Potassium 4.9; Sodium 139   Recent Lipid Panel Lab Results  Component Value Date/Time   CHOL 139 03/27/2020 10:43 AM   TRIG 163 (H) 03/27/2020 10:43 AM   HDL 34 (L) 03/27/2020 10:43 AM   CHOLHDL 4.1 03/27/2020 10:43 AM   CHOLHDL 3.0 09/09/2017 11:04 AM   LDLCALC 77 03/27/2020 10:43 AM   LDLCALC 59 09/09/2017 11:04 AM    Physical Exam:    VS:  BP (!) 148/60   Pulse (!) 55   Ht 5\' 8"  (1.727 m)   Wt 248 lb 9.6 oz (112.8 kg)   SpO2 97%   BMI 37.80 kg/m     Wt Readings from Last 3 Encounters:  05/29/20 248 lb 9.6 oz (112.8 kg)  04/19/20 248 lb (112.5 kg)  03/27/20 248 lb (112.5 kg)     Constitutional:      Appearance: Healthy appearance. Not in distress.  Neck:     Vascular: JVD normal.  Pulmonary:     Effort: Pulmonary effort is normal.     Breath sounds: No wheezing. No rales.  Cardiovascular:     Normal rate. Regular rhythm. Normal S1. Normal S2.     Murmurs: There is no murmur.  Edema:    Peripheral edema absent.  Abdominal:     Palpations: Abdomen is soft.  Skin:    General: Skin is warm and dry.  Neurological:     General: No focal deficit present.     Mental Status: Alert and oriented to person, place and time.     Cranial Nerves: Cranial nerves are intact.      ASSESSMENT & PLAN:    1. Coronary artery disease involving native coronary artery of native heart without angina pectoris History of non-STEMI in 2016 followed by CABG.  He recently had fairly consistent exertional chest  discomfort.  However, this is resolved.  A nuclear stress test demonstrated no ischemia and was low risk.  He was previously on rosuvastatin which was causing issues with diarrhea.  He also had his second COVID-19 vaccine sometime before his symptoms started.  Question if his symptoms may have been related to the vaccine or possibly myalgias from statin therapy.  He is now on PCSK9 inhibitor therapy and is no  longer having symptoms.  No further testing is warranted at this time.  Continue aspirin, Alirocumab, metoprolol tartrate.  Follow-up in 6 months.  2. Essential hypertension Blood pressure has been running above target.  Increase lisinopril to 20 mg daily.  Continue current dose of metoprolol tartrate.  He will obtain blood pressures over the next few weeks and send those to me through MyChart.  If his pressures remain above target, consider adding HCTZ to his medical regimen.  Obtain BMET in 2 weeks.  Low-salt diet information will be provided.  3. Mixed hyperlipidemia As noted, he is now on Alirocumab.  Arrange follow-up lipids and LFTs in 3 months.   Dispo:  Return in about 6 months (around 11/29/2020) for Routine Follow Up, w/ Dr. Excell Seltzer, or [name], PA-C, in person.   Medication Adjustments/Labs and Tests Ordered: Current medicines are reviewed at length with the patient today.  Concerns regarding medicines are outlined above.  Tests Ordered: Orders Placed This Encounter  Procedures  . Basic metabolic panel  . Lipid panel  . Hepatic function panel   Medication Changes: Meds ordered this encounter  Medications  . nitroGLYCERIN (NITROSTAT) 0.4 MG SL tablet    Sig: Place 1 tablet (0.4 mg total) under the tongue every 5 (five) minutes as needed for chest pain.    Dispense:  30 tablet    Refill:  6  . lisinopril (ZESTRIL) 20 MG tablet    Sig: Take 1 tablet (20 mg total) by mouth daily.    Dispense:  90 tablet    Refill:  2    Signed, [name], PA-C  05/29/2020 8:46 AM     [facility] [address] [place], [place], [place]  [address] Phone: [phone]; Fax: [phone]

## 2020-05-29 ENCOUNTER — Encounter: Payer: Self-pay | Admitting: Physician Assistant

## 2020-05-29 ENCOUNTER — Ambulatory Visit (INDEPENDENT_AMBULATORY_CARE_PROVIDER_SITE_OTHER): Payer: 59 | Admitting: Physician Assistant

## 2020-05-29 ENCOUNTER — Other Ambulatory Visit: Payer: Self-pay

## 2020-05-29 VITALS — BP 148/60 | HR 55 | Ht 68.0 in | Wt 248.6 lb

## 2020-05-29 DIAGNOSIS — I10 Essential (primary) hypertension: Secondary | ICD-10-CM | POA: Diagnosis not present

## 2020-05-29 DIAGNOSIS — I2510 Atherosclerotic heart disease of native coronary artery without angina pectoris: Secondary | ICD-10-CM

## 2020-05-29 DIAGNOSIS — E782 Mixed hyperlipidemia: Secondary | ICD-10-CM | POA: Diagnosis not present

## 2020-05-29 MED ORDER — NITROGLYCERIN 0.4 MG SL SUBL: 0.4000 mg | SUBLINGUAL_TABLET | SUBLINGUAL | 6 refills | Status: AC | PRN

## 2020-05-29 MED ORDER — LISINOPRIL 20 MG PO TABS: 20.0000 mg | ORAL_TABLET | Freq: Every day | ORAL | 2 refills | Status: DC

## 2020-05-29 NOTE — Patient Instructions (Signed)
Medication Instructions:   Your physician has recommended you make the following change in your medication:   1) Increase Lisinopril to 20 mg, 1 tablet by mouth once a day  *If you need a refill on your cardiac medications before your next appointment, please call your pharmacy*  Lab Work:  Your physician recommends that you return for lab work in: 2 weeks for BMET, in 3 months for LFTs/Lipids  If you have labs (blood work) drawn today and your tests are completely normal, you will receive your results only by: Marland Kitchen MyChart Message (if you have MyChart) OR . A paper copy in the mail If you have any lab test that is abnormal or we need to change your treatment, we will call you to review the results.  Testing/Procedures:  None ordered today  Follow-Up: At [facility], you and your health needs are our priority.  As part of our continuing mission to provide you with exceptional heart care, we have created designated Provider Care Teams.  These Care Teams include your primary Cardiologist (physician) and Advanced Practice Providers (APPs -  Physician Assistants and Nurse Practitioners) who all work together to provide you with the care you need, when you need it.  We recommend signing up for the patient portal called "MyChart".  Sign up information is provided on this After Visit Summary.  MyChart is used to connect with patients for Virtual Visits (Telemedicine).  Patients are able to view lab/test results, encounter notes, upcoming appointments, etc.  Non-urgent messages can be sent to your provider as well.   To learn more about what you can do with MyChart, go to [email or web address].    Your next appointment:   6 month(s)  The format for your next appointment:   In Person  Provider:   You may see Tonny Bollman, MD or [name], PA-C  Other Instructions  Check your blood pressure once a day for 2 weeks and send readings through my chart.   Two Gram Sodium Diet 2000  mg  What is Sodium? Sodium is a mineral found naturally in many foods. The most significant source of sodium in the diet is table salt, which is about 40% sodium.  Processed, convenience, and preserved foods also contain a large amount of sodium.  The body needs only 500 mg of sodium daily to function,  A normal diet provides more than enough sodium even if you do not use salt.  Why Limit Sodium? A build up of sodium in the body can cause thirst, increased blood pressure, shortness of breath, and water retention.  Decreasing sodium in the diet can reduce edema and risk of heart attack or stroke associated with high blood pressure.  Keep in mind that there are many other factors involved in these health problems.  Heredity, obesity, lack of exercise, cigarette smoking, stress and what you eat all play a role.  General Guidelines:  Do not add salt at the table or in cooking.  One teaspoon of salt contains over 2 grams of sodium.  Read food labels  Avoid processed and convenience foods  Ask your dietitian before eating any foods not dicussed in the menu planning guidelines  Consult your physician if you wish to use a salt substitute or a sodium containing medication such as antacids.  Limit milk and milk products to 16 oz (2 cups) per day.  Shopping Hints:  READ LABELS!! "Dietetic" does not necessarily mean low sodium.  Salt and other sodium ingredients are often added  to foods during processing.   Menu Planning Guidelines Food Group Choose More Often Avoid  Beverages (see also the milk group All fruit juices, low-sodium, salt-free vegetables juices, low-sodium carbonated beverages Regular vegetable or tomato juices, commercially softened water used for drinking or cooking  Breads and Cereals Enriched white, wheat, rye and pumpernickel bread, hard rolls and dinner rolls; muffins, cornbread and waffles; most dry cereals, cooked cereal without added salt; unsalted crackers and breadsticks; low  sodium or homemade bread crumbs Bread, rolls and crackers with salted tops; quick breads; instant hot cereals; pancakes; commercial bread stuffing; self-rising flower and biscuit mixes; regular bread crumbs or cracker crumbs  Desserts and Sweets Desserts and sweets mad with mild should be within allowance Instant pudding mixes and cake mixes  Fats Butter or margarine; vegetable oils; unsalted salad dressings, regular salad dressings limited to 1 Tbs; light, sour and heavy cream Regular salad dressings containing bacon fat, bacon bits, and salt pork; snack dips made with instant soup mixes or processed cheese; salted nuts  Fruits Most fresh, frozen and canned fruits Fruits processed with salt or sodium-containing ingredient (some dried fruits are processed with sodium sulfites        Vegetables Fresh, frozen vegetables and low- sodium canned vegetables Regular canned vegetables, sauerkraut, pickled vegetables, and others prepared in brine; frozen vegetables in sauces; vegetables seasoned with ham, bacon or salt pork  Condiments, Sauces, Miscellaneous  Salt substitute with physician's approval; pepper, herbs, spices; vinegar, lemon or lime juice; hot pepper sauce; garlic powder, onion powder, low sodium soy sauce (1 Tbs.); low sodium condiments (ketchup, chili sauce, mustard) in limited amounts (1 tsp.) fresh ground horseradish; unsalted tortilla chips, pretzels, potato chips, popcorn, salsa (1/4 cup) Any seasoning made with salt including garlic salt, celery salt, onion salt, and seasoned salt; sea salt, rock salt, kosher salt; meat tenderizers; monosodium glutamate; mustard, regular soy sauce, barbecue, sauce, chili sauce, teriyaki sauce, steak sauce, Worcestershire sauce, and most flavored vinegars; canned gravy and mixes; regular condiments; salted snack foods, olives, picles, relish, horseradish sauce, catsup   Food preparation: Try these seasonings Meats:    Pork Sage, onion Serve with applesauce   Chicken Poultry seasoning, thyme, parsley Serve with cranberry sauce  Lamb Curry powder, rosemary, garlic, thyme Serve with mint sauce or jelly  Veal Marjoram, basil Serve with current jelly, cranberry sauce  Beef Pepper, bay leaf Serve with dry mustard, unsalted chive butter  Fish [place] leaf, dill Serve with unsalted lemon butter, unsalted parsley butter  Vegetables:    Asparagus Lemon juice   Broccoli Lemon juice   Carrots Mustard dressing parsley, mint, nutmeg, glazed with unsalted butter and sugar   Green beans Marjoram, lemon juice, nutmeg,dill seed   Tomatoes Basil, marjoram, onion   Spice /blend for [employer]"Salt Shaker" 4 tsp ground thyme 1 tsp ground sage 3 tsp ground rosemary 4 tsp ground marjoram   Test your knowledge 1. A product that says "Salt Free" may still contain sodium. True or False 2. Garlic Powder and Hot Pepper Sauce an be used as alternative seasonings.True or False 3. Processed foods have more sodium than fresh foods.  True or False 4. Canned Vegetables have less sodium than froze True or False  WAYS TO DECREASE YOUR SODIUM INTAKE 1. Avoid the use of added salt in cooking and at the table.  Table salt (and other prepared seasonings which contain salt) is probably one of the greatest sources of sodium in the diet.  Unsalted foods can gain flavor from  the sweet, sour, and butter taste sensations of herbs and spices.  Instead of using salt for seasoning, try the following seasonings with the foods listed.  Remember: how you use them to enhance natural food flavors is limited only by your creativity... Allspice-Meat, fish, eggs, fruit, peas, red and yellow vegetables Almond Extract-Fruit baked goods Anise Seed-Sweet breads, fruit, carrots, beets, cottage cheese, cookies (tastes like licorice) Basil-Meat, fish, eggs, vegetables, rice, vegetables salads, soups, sauces Bay Leaf-Meat, fish, stews, poultry Burnet-Salad, vegetables (cucumber-like flavor) Caraway Seed-Bread,  cookies, cottage cheese, meat, vegetables, cheese, rice Cardamon-Baked goods, fruit, soups Celery Powder or seed-Salads, salad dressings, sauces, meatloaf, soup, bread.Do not use  celery salt Chervil-Meats, salads, fish, eggs, vegetables, cottage cheese (parsley-like flavor) Chili Power-Meatloaf, chicken cheese, corn, eggplant, egg dishes Chives-Salads cottage cheese, egg dishes, soups, vegetables, sauces Cilantro-Salsa, casseroles Cinnamon-Baked goods, fruit, pork, lamb, chicken, carrots Cloves-Fruit, baked goods, fish, pot roast, green beans, beets, carrots Coriander-Pastry, cookies, meat, salads, cheese (lemon-orange flavor) Cumin-Meatloaf, fish,cheese, eggs, cabbage,fruit pie (caraway flavor) [employer], fruit, eggs, fish, poultry, cottage cheese, vegetables Dill Seed-Meat, cottage cheese, poultry, vegetables, fish, salads, bread Fennel Seed-Bread, cookies, apples, pork, eggs, fish, beets, cabbage, cheese, Licorice-like flavor Garlic-(buds or powder) Salads, meat, poultry, fish, bread, butter, vegetables, potatoes.Do not  use garlic salt Ginger-Fruit, vegetables, baked goods, meat, fish, poultry Horseradish Root-Meet, vegetables, butter Lemon Juice or Extract-Vegetables, fruit, tea, baked goods, fish salads Mace-Baked goods fruit, vegetables, fish, poultry (taste like nutmeg) Maple Extract-Syrups Marjoram-Meat, chicken, fish, vegetables, breads, green salads (taste like Sage) Mint-Tea, lamb, sherbet, vegetables, desserts, carrots, cabbage Mustard, Dry or Seed-Cheese, eggs, meats, vegetables, poultry Nutmeg-Baked goods, fruit, chicken, eggs, vegetables, desserts Onion Powder-Meat, fish, poultry, vegetables, cheese, eggs, bread, rice salads (Do not use   Onion salt) Orange Extract-Desserts, baked goods Oregano-Pasta, eggs, cheese, onions, pork, lamb, fish, chicken, vegetables, green salads Paprika-Meat, fish, poultry, eggs, cheese, vegetables Parsley Flakes-Butter, vegetables,  meat fish, poultry, eggs, bread, salads (certain forms may   Contain sodium Pepper-Meat fish, poultry, vegetables, eggs Peppermint Extract-Desserts, baked goods Poppy Seed-Eggs, bread, cheese, fruit dressings, baked goods, noodles, vegetables, cottage  [employer], poultry, meat, fish, cauliflower, turnips,eggs bread Saffron-Rice, bread, veal, chicken, fish, eggs Sage-Meat, fish, poultry, onions, eggplant, tomateos, pork, stews Savory-Eggs, salads, poultry, meat, rice, vegetables, soups, pork Tarragon-Meat, poultry, fish, eggs, butter, vegetables (licorice-like flavor)  Thyme-Meat, poultry, fish, eggs, vegetables, (clover-like flavor), sauces, soups Tumeric-Salads, butter, eggs, fish, rice, vegetables (saffron-like flavor) Vanilla Extract-Baked goods, candy Vinegar-Salads, vegetables, meat marinades Walnut Extract-baked goods, candy  2. Choose your Foods Wisely   The following is a list of foods to avoid which are high in sodium:  Meats-Avoid all smoked, canned, salt cured, dried and kosher meat and fish as well as Anchovies   [employer] meats:Bologna, Liverwurst, Pastrami Canned meat or fish  Marinated herring Caviar    Pepperoni Corned Beef   Pizza Dried chipped beef  Salami Frozen breaded fish or meat Salt pork Frankfurters or hot dogs  Sardines Gefilte fish   Sausage Ham (boiled ham, Proscuitto Smoked butt    spiced ham)   Spam      TV Dinners Vegetables Canned vegetables (Regular) Relish Canned mushrooms  Sauerkraut Olives    Tomato juice Pickles  Bakery and Dessert Products Canned puddings  Cream pies Cheesecake   Decorated cakes Cookies  Beverages/Juices Tomato juice, regular  Gatorade   V-8 vegetable juice, regular  Breads and Cereals Biscuit mixes   Salted potato chips, corn chips, pretzels Bread  stuffing mixes  Salted crackers and rolls Pancake and waffle mixes Self-rising  flour  Seasonings Accent    Meat sauces Barbecue sauce  Meat tenderizer Catsup    Monosodium glutamate (MSG) Celery salt   Onion salt Chili sauce   Prepared mustard Garlic salt   Salt, seasoned salt, sea salt Gravy mixes   Soy sauce Horseradish   Steak sauce Ketchup   Tartar sauce Lite salt    Teriyaki sauce Marinade mixes   Worcestershire sauce  Others Baking powder   Cocoa and cocoa mixes Baking soda   Commercial casserole mixes Candy-caramels, chocolate  Dehydrated soups    Bars, fudge,nougats  Instant rice and pasta mixes Canned broth or soup  Maraschino cherries Cheese, aged and processed cheese and cheese spreads  Learning Assessment Quiz  Indicated T (for True) or F (for False) for each of the following statements:  1. _____ Fresh fruits and vegetables and unprocessed grains are generally low in sodium 2. _____ Water may contain a considerable amount of sodium, depending on the source 3. _____ You can always tell if a food is high in sodium by tasting it 4. _____ Certain laxatives my be high in sodium and should be avoided unless prescribed   by a physician or pharmacist 5. _____ Salt substitutes may be used freely by anyone on a sodium restricted diet 6. _____ Sodium is present in table salt, food additives and as a natural component of   most foods 7. _____ Table salt is approximately 90% sodium 8. _____ Limiting sodium intake may help prevent excess fluid accumulation in the body 9. _____ On a sodium-restricted diet, seasonings such as bouillon soy sauce, and    cooking wine should be used in place of table salt 10. _____ On an ingredient list, a product which lists monosodium glutamate as the first   ingredient is an appropriate food to include on a low sodium diet  Circle the best answer(s) to the following statements (Hint: there may be more than one correct answer)  11. On a low-sodium diet, some acceptable snack items are:    A. Olives  F. Bean dip   K.  Grapefruit juice    B. Salted Pretzels G. Commercial Popcorn   L. Canned peaches    C. Carrot Sticks  H. Bouillon   M. Unsalted nuts   D. [place] fries  I. Peanut butter crackers N. Salami   E. Sweet pickles J. Tomato Juice   O. Pizza  12.  Seasonings that may be used freely on a reduced - sodium diet include   A. Lemon wedges F.Monosodium glutamate K. Celery seed    B.Soysauce   G. Pepper   L. Mustard powder   C. Sea salt  H. Cooking wine  M. Onion flakes   D. Vinegar  E. Prepared horseradish N. Salsa   E. Sage   J. Worcestershire sauce  O. Chutney

## 2020-06-12 ENCOUNTER — Other Ambulatory Visit: Payer: 59

## 2020-06-12 ENCOUNTER — Other Ambulatory Visit: Payer: Self-pay

## 2020-06-12 DIAGNOSIS — I10 Essential (primary) hypertension: Secondary | ICD-10-CM

## 2020-06-12 DIAGNOSIS — I2510 Atherosclerotic heart disease of native coronary artery without angina pectoris: Secondary | ICD-10-CM

## 2020-06-12 DIAGNOSIS — E782 Mixed hyperlipidemia: Secondary | ICD-10-CM

## 2020-06-12 LAB — BASIC METABOLIC PANEL
BUN/Creatinine Ratio: 13 (ref 9–20)
BUN: 11 mg/dL (ref 6–24)
CO2: 24 mmol/L (ref 20–29)
Calcium: 9.6 mg/dL (ref 8.7–10.2)
Chloride: 100 mmol/L (ref 96–106)
Creatinine, Ser: 0.86 mg/dL (ref 0.76–1.27)
GFR calc Af Amer: 119 mL/min/{1.73_m2} (ref >59)
GFR calc non Af Amer: 103 mL/min/{1.73_m2} (ref >59)
Glucose: 110 mg/dL — ABNORMAL HIGH (ref 65–99)
Potassium: 4.8 mmol/L (ref 3.5–5.2)
Sodium: 137 mmol/L (ref 134–144)

## 2020-06-12 MED ORDER — HYDROCHLOROTHIAZIDE 25 MG PO TABS
25.0000 mg | ORAL_TABLET | Freq: Every day | ORAL | 11 refills | Status: DC
Start: 2020-06-12 — End: 2021-05-29

## 2020-06-12 MED ORDER — HYDROCHLOROTHIAZIDE 25 MG PO TABS: 25.0000 mg | ORAL_TABLET | Freq: Every day | ORAL | 11 refills | Status: DC

## 2020-06-12 NOTE — Telephone Encounter (Signed)
BP above target. PLAN:  1. Start HCTZ 25 mg once daily 2. BMET 2 weeks I have sent Rx to pharmacy and entered BMET order.  [name], PA-C    06/12/2020 9:29 AM

## 2020-06-26 ENCOUNTER — Other Ambulatory Visit: Payer: Self-pay

## 2020-06-26 ENCOUNTER — Other Ambulatory Visit: Payer: 59

## 2020-06-26 DIAGNOSIS — I10 Essential (primary) hypertension: Secondary | ICD-10-CM

## 2020-06-26 LAB — BASIC METABOLIC PANEL
BUN/Creatinine Ratio: 17 (ref 9–20)
BUN: 15 mg/dL (ref 6–24)
CO2: 24 mmol/L (ref 20–29)
Calcium: 9.6 mg/dL (ref 8.7–10.2)
Chloride: 98 mmol/L (ref 96–106)
Creatinine, Ser: 0.86 mg/dL (ref 0.76–1.27)
GFR calc Af Amer: 119 mL/min/{1.73_m2} (ref >59)
GFR calc non Af Amer: 103 mL/min/{1.73_m2} (ref >59)
Glucose: 111 mg/dL — ABNORMAL HIGH (ref 65–99)
Potassium: 4.6 mmol/L (ref 3.5–5.2)
Sodium: 137 mmol/L (ref 134–144)

## 2020-08-29 ENCOUNTER — Other Ambulatory Visit: Payer: Self-pay

## 2020-08-29 ENCOUNTER — Other Ambulatory Visit: Payer: 59 | Admitting: *Deleted

## 2020-08-29 DIAGNOSIS — I2510 Atherosclerotic heart disease of native coronary artery without angina pectoris: Secondary | ICD-10-CM

## 2020-08-29 DIAGNOSIS — E782 Mixed hyperlipidemia: Secondary | ICD-10-CM

## 2020-08-29 DIAGNOSIS — I10 Essential (primary) hypertension: Secondary | ICD-10-CM

## 2020-08-29 LAB — LIPID PANEL
Chol/HDL Ratio: 4.9 ratio (ref 0.0–5.0)
Cholesterol, Total: 170 mg/dL (ref 100–199)
HDL: 35 mg/dL — ABNORMAL LOW (ref >39)
LDL Chol Calc (NIH): 95 mg/dL (ref 0–99)
Triglycerides: 234 mg/dL — ABNORMAL HIGH (ref 0–149)
VLDL Cholesterol Cal: 40 mg/dL (ref 5–40)

## 2020-08-29 LAB — HEPATIC FUNCTION PANEL
ALT: 31 IU/L (ref 0–44)
AST: 23 IU/L (ref 0–40)
Albumin: 4.8 g/dL (ref 4.0–5.0)
Alkaline Phosphatase: 87 IU/L (ref 44–121)
Bilirubin Total: 0.7 mg/dL (ref 0.0–1.2)
Bilirubin, Direct: 0.16 mg/dL (ref 0.00–0.40)
Total Protein: 7.6 g/dL (ref 6.0–8.5)

## 2020-08-30 ENCOUNTER — Telehealth: Payer: Self-pay | Admitting: Pharmacist

## 2020-08-30 ENCOUNTER — Telehealth: Payer: Self-pay | Admitting: Medical

## 2020-08-30 MED ORDER — PRALUENT 150 MG/ML ~~LOC~~ SOAJ: 1.0000 "pen " | SUBCUTANEOUS | 11 refills | Status: DC

## 2020-08-30 MED ORDER — ALIROCUMAB 150 MG/ML ~~LOC~~ SOAJ: 1.0000 "pen " | SUBCUTANEOUS | 11 refills | Status: DC

## 2020-08-30 NOTE — Telephone Encounter (Signed)
Left Vm for pt to call the office and schedule CPE

## 2020-08-30 NOTE — Telephone Encounter (Signed)
Increase Praluent dose to 150mg 

## 2020-08-30 NOTE — Addendum Note (Signed)
Addended byAlben Spittle, Lorin Picket T on: 08/30/2020 09:20 AM   Modules accepted: Orders

## 2020-08-30 NOTE — Telephone Encounter (Signed)
Please call patient to set up yearly physical.   This is a friendly reminder to have them come in for annual wellness exam/physical. Thanks Shane 

## 2020-08-31 ENCOUNTER — Ambulatory Visit: Payer: 59

## 2020-09-04 ENCOUNTER — Other Ambulatory Visit: Payer: Self-pay

## 2020-09-04 DIAGNOSIS — I2510 Atherosclerotic heart disease of native coronary artery without angina pectoris: Secondary | ICD-10-CM

## 2020-09-04 DIAGNOSIS — E782 Mixed hyperlipidemia: Secondary | ICD-10-CM

## 2020-12-06 ENCOUNTER — Encounter: Payer: Self-pay | Admitting: Medical

## 2020-12-07 ENCOUNTER — Other Ambulatory Visit: Payer: 59 | Admitting: *Deleted

## 2020-12-07 ENCOUNTER — Other Ambulatory Visit: Payer: Self-pay

## 2020-12-07 DIAGNOSIS — E782 Mixed hyperlipidemia: Secondary | ICD-10-CM

## 2020-12-07 DIAGNOSIS — I2510 Atherosclerotic heart disease of native coronary artery without angina pectoris: Secondary | ICD-10-CM

## 2020-12-08 LAB — LIPID PANEL
Chol/HDL Ratio: 4.6 ratio (ref 0.0–5.0)
Cholesterol, Total: 153 mg/dL (ref 100–199)
HDL: 33 mg/dL — ABNORMAL LOW (ref >39)
LDL Chol Calc (NIH): 90 mg/dL (ref 0–99)
Triglycerides: 175 mg/dL — ABNORMAL HIGH (ref 0–149)
VLDL Cholesterol Cal: 30 mg/dL (ref 5–40)

## 2020-12-08 LAB — HEPATIC FUNCTION PANEL
ALT: 26 IU/L (ref 0–44)
AST: 18 IU/L (ref 0–40)
Albumin: 4.7 g/dL (ref 4.0–5.0)
Alkaline Phosphatase: 79 IU/L (ref 44–121)
Bilirubin Total: 0.4 mg/dL (ref 0.0–1.2)
Bilirubin, Direct: 0.11 mg/dL (ref 0.00–0.40)
Total Protein: 7.2 g/dL (ref 6.0–8.5)

## 2020-12-10 ENCOUNTER — Ambulatory Visit: Payer: 59 | Admitting: Physician Assistant

## 2020-12-11 ENCOUNTER — Telehealth: Payer: Self-pay | Admitting: Pharmacist

## 2020-12-11 NOTE — Telephone Encounter (Signed)
Called pt to discuss lipid labs. Will see if pt is willing to add nexletol. Although TG are better, still a little high. Will discuss diet. LVM for pt to call back

## 2020-12-27 NOTE — Telephone Encounter (Signed)
Thank you! [name], PA-C    12/27/2020 3:03 PM

## 2020-12-27 NOTE — Telephone Encounter (Signed)
I spoke with patient about his labwork. We discussed diet. He states he started a low carb diet just before these labs. He is down 10lb. Encouraged him to continue this. Watch for added sugars in products. His [employer] insurance ends at the end of the month. He has applied for medicaid. Still waiting to hear. If he is denied medicaid he will renew [employer].  Would like to hold off on new med until her knows who his insurer will be going forward. He will call us when he know and then we will submit PA for nexletol.

## 2021-01-21 ENCOUNTER — Other Ambulatory Visit: Payer: Self-pay | Admitting: Physician Assistant

## 2021-01-21 MED ORDER — METOPROLOL TARTRATE 50 MG PO TABS: 50.0000 mg | ORAL_TABLET | Freq: Two times a day (BID) | ORAL | 3 refills | Status: DC

## 2021-01-21 MED ORDER — LISINOPRIL 20 MG PO TABS: 20.0000 mg | ORAL_TABLET | Freq: Every day | ORAL | 2 refills | Status: DC

## 2021-01-21 NOTE — Telephone Encounter (Signed)
Outpatient Medication Detail   Disp Refills Start End   metoprolol tartrate (LOPRESSOR) 50 MG tablet 180 tablet 3 01/21/2021    Sig - Route: Take 1 tablet (50 mg total) by mouth 2 (two) times daily with a meal. - Oral   Sent to pharmacy as: metoprolol tartrate (LOPRESSOR) 50 MG tablet   E-Prescribing Status: Receipt confirmed by pharmacy (01/21/2021  9:19 AM EST)     Pharmacy  [facility] [place] [facility] [address], [place] - [address]

## 2021-01-25 ENCOUNTER — Telehealth: Payer: Self-pay | Admitting: Pharmacist

## 2021-01-25 NOTE — Telephone Encounter (Signed)
Pt left message stating he now has active [employer] and is inquiring about starting Nexletol.   Returned call to pt, no answer, left VM.

## 2021-01-31 NOTE — Progress Notes (Addendum)
Cardiology Office Note:    Date:  02/01/2021   ID:  [name], DOB 1971-05-30, MRN [id number]  PCP:  [facility]  Cardiologist:  Tonny Bollman, MD   Advanced Practice Provider:  Beatrice Lecher, PA-C Electrophysiologist:  None       Referring MD: Jac Canavan, PA-C   Chief Complaint:  Follow-up (CAD)    Patient Profile:    Hunter Gomez is a 50 y.o. male with:   Coronary artery disease  ? S/p NSTEMI in 2016 >> s/p CABG ? Myoview 5/21: low risk   Hx of gallstone pancreatitis s/p ERCP, cholecystectomy   Hyperlipidemia   Hypertension  Echocardiogram 2/16: EF 55-60, Gr 2 DD  Prior CV studies: Myoview 04/19/2020 EF 54, no ischemia or infarction, low risk  Echo 01/10/15 Mild LVH, EF 55-60%, grade 2 diastolic dysfunction, trivial AI, trivial TR  Cardiac Cath 12/18/14 LM: Distal 95% extending into ostium of LAD and LCx LAD: Ostial 90%, mid 30% LCx: ostial 80-90%, proximal 50% RCA: Ostial PDA 80%, ostial PLA 75% EF 55-65% >> Emergent CABG  Carotid US 12/18/14 Bilateral ICA 1-39%  History of Present Illness:    Hunter Gomez was last seen in 7/21.  He returns for f/u.  Of note, his LDL remains above goal on [id number].  Our PharmD clinic was working on getting him approved for bempedoic acid but was unable to reach him.   He is here alone.  He continues to have episodes of chest discomfort with certain types of activities.  Walking up the ramp into the [facility] causes symptoms.  He also remembers walking up a hill in [place] after attending a race that caused the same symptoms.  They remind him somewhat of his previous angina.  He really has not had significant shortness of breath.  He has not tried nitroglycerin.  He is concerned about his symptoms.  His brother was just diagnosed with multivessel coronary disease.  He has not had syncope, orthopnea, leg edema      Past Medical History:  Diagnosis  Date  . CAD (coronary artery disease)    a.  NSTEMI (1/16):  LHC - dLM 95, oLAD 90, mLAD 30, oCFX 80-90, pCFX 50, oPDA 80, oPLA 75, EF 55-65% >> urgent CABG// Myoview 03/2020: EF 54, no ischemia or infarction; low risk  . Hepatic steatosis   . HLD (hyperlipidemia)   . Hx of echocardiogram    Echo (2/16):  Mild LVH, EF 55-60%, Gr 2 DD, trivial AI/TR, no effusion  . Hypertension 10/2012  . Muscle weakness 1997   prior seizure like activity, but none since; prior neurology eval, prior normal EEG 1997  . Obesity    s/p Lap Band surgery  . Vision problem    prior use of glasses, not currently    Current Medications: Current Meds  Medication Sig  . Alirocumab (PRALUENT) 150 MG/ML SOAJ Inject 1 pen into the skin every 14 (fourteen) days.  Marland Kitchen aspirin 81 MG tablet Take 81 mg by mouth daily.  . hydrochlorothiazide (HYDRODIURIL) 25 MG tablet Take 1 tablet (25 mg total) by mouth daily.  . isosorbide mononitrate (IMDUR) 30 MG 24 hr tablet Take 0.5 tablets (15 mg total) by mouth daily.  Marland Kitchen lisinopril (ZESTRIL) 20 MG tablet Take 1 tablet (20 mg total) by mouth daily.  . metoprolol tartrate (LOPRESSOR) 50 MG tablet Take 1 tablet (50 mg total) by mouth 2 (two) times daily with a  meal.  . nitroGLYCERIN (NITROSTAT) 0.4 MG SL tablet Place 1 tablet (0.4 mg total) under the tongue every 5 (five) minutes as needed for chest pain.  Marland Kitchen ondansetron (ZOFRAN) 4 MG tablet Take 1 tablet (4 mg total) by mouth every 8 (eight) hours as needed for nausea or vomiting.  . tamsulosin (FLOMAX) 0.4 MG CAPS capsule TAKE 1 CAPSULE(0.4 MG) BY MOUTH DAILY AFTER BREAKFAST     Allergies:   Patient has no known allergies.   Social History   Tobacco Use  . Smoking status: Never Smoker  . Smokeless tobacco: Never Used  Vaping Use  . Vaping Use: Never used  Substance Use Topics  . Alcohol use: No  . Drug use: No     Family Hx: The patient's family history includes Cancer in his maternal grandfather and paternal grandmother;  Diabetes in his mother; Heart disease (age of onset: 63) in his father; Heart disease (age of onset: 59) in his mother; Hyperlipidemia in his brother, father, and mother; Hypertension in his brother, father, and mother; Peripheral vascular disease in his mother; Stroke in his father. There is no history of Heart attack.  Review of Systems  Constitutional: Negative for chills and fever.  Respiratory: Negative for cough.   Musculoskeletal:       Bilat heel pain  Gastrointestinal: Negative for hematochezia and melena.  Genitourinary: Negative for hematuria.  All other systems reviewed and are negative.    EKGs/Labs/Other Test Reviewed:    EKG:  EKG is   ordered today.  The ekg ordered today demonstrates sinus bradycardia, HR 56, normal axis, no ST-T wave changes, QTC 387  Recent Labs: 06/26/2020: BUN 15; Creatinine, Ser 0.86; Potassium 4.6; Sodium 137 12/07/2020: ALT 26   Recent Lipid Panel Lab Results  Component Value Date/Time   CHOL 153 12/07/2020 08:02 AM   TRIG 175 (H) 12/07/2020 08:02 AM   HDL 33 (L) 12/07/2020 08:02 AM   CHOLHDL 4.6 12/07/2020 08:02 AM   CHOLHDL 3.0 09/09/2017 11:04 AM   LDLCALC 90 12/07/2020 08:02 AM   LDLCALC 59 09/09/2017 11:04 AM      Risk Assessment/Calculations:      Physical Exam:    VS:  BP 112/70   Pulse (!) 58   Ht 5' 8.5" (1.74 m)   Wt 240 lb (108.9 kg)   SpO2 98%   BMI 35.96 kg/m     Wt Readings from Last 3 Encounters:  02/01/21 240 lb (108.9 kg)  05/29/20 248 lb 9.6 oz (112.8 kg)  04/19/20 248 lb (112.5 kg)     Constitutional:      Appearance: Healthy appearance. Not in distress.  Neck:     Vascular: No JVR. JVD normal.  Pulmonary:     Effort: Pulmonary effort is normal.     Breath sounds: No wheezing. No rales.  Cardiovascular:     Normal rate. Regular rhythm. Normal S1. Normal S2.     Murmurs: There is no murmur.  Edema:    Peripheral edema absent.  Abdominal:     Palpations: Abdomen is soft. There is no hepatomegaly.   Musculoskeletal:     Comments: Dupuytren's contracture L hand Skin:    General: Skin is warm and dry.  Neurological:     General: No focal deficit present.     Mental Status: Alert and oriented to person, place and time.     Cranial Nerves: Cranial nerves are intact.       ASSESSMENT & PLAN:    1.  Coronary artery disease involving native coronary artery of native heart with angina pectoris (HCC) s/p non-STEMI in 2016 followed by CABG. Myoview in 5/21 low risk.  He continues to have exertional chest pain at times.  This is no worse but is concerning for him.  We discussed intensifying medical therapy versus proceeding with cardiac catheterization.  I recommend proceeding with cardiac catheterization.  I discussed this with Dr. Excell Seltzer who agreed.  The patient does not take PDE-5 inhibitors.  -Arrange cardiac catheterization in the next 2 weeks with Dr. Excell Seltzer  -Start isosorbide 15 mg daily  -Continue aspirin, metoprolol tartrate, Alirocumab  -Follow-up 2 weeks after cath  2. Essential hypertension The patient's blood pressure is controlled on his current regimen.  Continue current therapy.   3. Mixed hyperlipidemia His LDL remains above goal on Alirocumab.  Our pharmacist will reach out to him regarding bempedoic acid.  Authorization is pending.    Shared Decision Making/Informed Consent The risks [stroke (1 in 1000), death (1 in 1000), kidney failure [usually temporary] (1 in 500), bleeding (1 in 200), allergic reaction [possibly serious] (1 in 200)], benefits (diagnostic support and management of coronary artery disease) and alternatives of a cardiac catheterization were discussed in detail with Hunter Gomez and he is willing to proceed.    Dispo:  Return for Post Procedure Follow Up, w/ [name], PA-C.   Medication Adjustments/Labs and Tests Ordered: Current medicines are reviewed at length with the patient today.  Concerns regarding medicines are outlined above.  Tests  Ordered: Orders Placed This Encounter  Procedures  . Basic Metabolic Panel (BMET)  . CBC  . EKG 12-Lead   Medication Changes: Meds ordered this encounter  Medications  . isosorbide mononitrate (IMDUR) 30 MG 24 hr tablet    Sig: Take 0.5 tablets (15 mg total) by mouth daily.    Dispense:  15 tablet    Refill:  3    Signed, [name], PA-C  02/01/2021 9:53 AM    [facility] [address] [place], [place], [place]  [address] Phone: [phone]; Fax: [phone]

## 2021-01-31 NOTE — H&P (View-Only) (Signed)
Cardiology Office Note:    Date:  02/01/2021   ID:  [name], DOB 1971-05-30, MRN [id number]  PCP:  [facility]  Cardiologist:  Tonny Bollman, MD   Advanced Practice Provider:  Beatrice Lecher, PA-C Electrophysiologist:  None       Referring MD: Jac Canavan, PA-C   Chief Complaint:  Follow-up (CAD)    Patient Profile:    Hunter Gomez is a 50 y.o. male with:   Coronary artery disease  ? S/p NSTEMI in 2016 >> s/p CABG ? Myoview 5/21: low risk   Hx of gallstone pancreatitis s/p ERCP, cholecystectomy   Hyperlipidemia   Hypertension  Echocardiogram 2/16: EF 55-60, Gr 2 DD  Prior CV studies: Myoview 04/19/2020 EF 54, no ischemia or infarction, low risk  Echo 01/10/15 Mild LVH, EF 55-60%, grade 2 diastolic dysfunction, trivial AI, trivial TR  Cardiac Cath 12/18/14 LM: Distal 95% extending into ostium of LAD and LCx LAD: Ostial 90%, mid 30% LCx: ostial 80-90%, proximal 50% RCA: Ostial PDA 80%, ostial PLA 75% EF 55-65% >> Emergent CABG  Carotid US 12/18/14 Bilateral ICA 1-39%  History of Present Illness:    Hunter Gomez was last seen in 7/21.  He returns for f/u.  Of note, his LDL remains above goal on WUJW1X.  Our PharmD clinic was working on getting him approved for bempedoic acid but was unable to reach him.   He is here alone.  He continues to have episodes of chest discomfort with certain types of activities.  Walking up the ramp into the [facility] causes symptoms.  He also remembers walking up a hill in [place] after attending a race that caused the same symptoms.  They remind him somewhat of his previous angina.  He really has not had significant shortness of breath.  He has not tried nitroglycerin.  He is concerned about his symptoms.  His brother was just diagnosed with multivessel coronary disease.  He has not had syncope, orthopnea, leg edema      Past Medical History:  Diagnosis  Date  . CAD (coronary artery disease)    a.  NSTEMI (1/16):  LHC - dLM 95, oLAD 90, mLAD 30, oCFX 80-90, pCFX 50, oPDA 80, oPLA 75, EF 55-65% >> urgent CABG// Myoview 03/2020: EF 54, no ischemia or infarction; low risk  . Hepatic steatosis   . HLD (hyperlipidemia)   . Hx of echocardiogram    Echo (2/16):  Mild LVH, EF 55-60%, Gr 2 DD, trivial AI/TR, no effusion  . Hypertension 10/2012  . Muscle weakness 1997   prior seizure like activity, but none since; prior neurology eval, prior normal EEG 1997  . Obesity    s/p Lap Band surgery  . Vision problem    prior use of glasses, not currently    Current Medications: Current Meds  Medication Sig  . Alirocumab (PRALUENT) 150 MG/ML SOAJ Inject 1 pen into the skin every 14 (fourteen) days.  Marland Kitchen aspirin 81 MG tablet Take 81 mg by mouth daily.  . hydrochlorothiazide (HYDRODIURIL) 25 MG tablet Take 1 tablet (25 mg total) by mouth daily.  . isosorbide mononitrate (IMDUR) 30 MG 24 hr tablet Take 0.5 tablets (15 mg total) by mouth daily.  Marland Kitchen lisinopril (ZESTRIL) 20 MG tablet Take 1 tablet (20 mg total) by mouth daily.  . metoprolol tartrate (LOPRESSOR) 50 MG tablet Take 1 tablet (50 mg total) by mouth 2 (two) times daily with a  meal.  . nitroGLYCERIN (NITROSTAT) 0.4 MG SL tablet Place 1 tablet (0.4 mg total) under the tongue every 5 (five) minutes as needed for chest pain.  Marland Kitchen ondansetron (ZOFRAN) 4 MG tablet Take 1 tablet (4 mg total) by mouth every 8 (eight) hours as needed for nausea or vomiting.  . tamsulosin (FLOMAX) 0.4 MG CAPS capsule TAKE 1 CAPSULE(0.4 MG) BY MOUTH DAILY AFTER BREAKFAST     Allergies:   Patient has no known allergies.   Social History   Tobacco Use  . Smoking status: Never Smoker  . Smokeless tobacco: Never Used  Vaping Use  . Vaping Use: Never used  Substance Use Topics  . Alcohol use: No  . Drug use: No     Family Hx: The patient's family history includes Cancer in his maternal grandfather and paternal grandmother;  Diabetes in his mother; Heart disease (age of onset: 63) in his father; Heart disease (age of onset: 59) in his mother; Hyperlipidemia in his brother, father, and mother; Hypertension in his brother, father, and mother; Peripheral vascular disease in his mother; Stroke in his father. There is no history of Heart attack.  Review of Systems  Constitutional: Negative for chills and fever.  Respiratory: Negative for cough.   Musculoskeletal:       Bilat heel pain  Gastrointestinal: Negative for hematochezia and melena.  Genitourinary: Negative for hematuria.  All other systems reviewed and are negative.    EKGs/Labs/Other Test Reviewed:    EKG:  EKG is   ordered today.  The ekg ordered today demonstrates sinus bradycardia, HR 56, normal axis, no ST-T wave changes, QTC 387  Recent Labs: 06/26/2020: BUN 15; Creatinine, Ser 0.86; Potassium 4.6; Sodium 137 12/07/2020: ALT 26   Recent Lipid Panel Lab Results  Component Value Date/Time   CHOL 153 12/07/2020 08:02 AM   TRIG 175 (H) 12/07/2020 08:02 AM   HDL 33 (L) 12/07/2020 08:02 AM   CHOLHDL 4.6 12/07/2020 08:02 AM   CHOLHDL 3.0 09/09/2017 11:04 AM   LDLCALC 90 12/07/2020 08:02 AM   LDLCALC 59 09/09/2017 11:04 AM      Risk Assessment/Calculations:      Physical Exam:    VS:  BP 112/70   Pulse (!) 58   Ht 5' 8.5" (1.74 m)   Wt 240 lb (108.9 kg)   SpO2 98%   BMI 35.96 kg/m     Wt Readings from Last 3 Encounters:  02/01/21 240 lb (108.9 kg)  05/29/20 248 lb 9.6 oz (112.8 kg)  04/19/20 248 lb (112.5 kg)     Constitutional:      Appearance: Healthy appearance. Not in distress.  Neck:     Vascular: No JVR. JVD normal.  Pulmonary:     Effort: Pulmonary effort is normal.     Breath sounds: No wheezing. No rales.  Cardiovascular:     Normal rate. Regular rhythm. Normal S1. Normal S2.     Murmurs: There is no murmur.  Edema:    Peripheral edema absent.  Abdominal:     Palpations: Abdomen is soft. There is no hepatomegaly.   Musculoskeletal:     Comments: Dupuytren's contracture L hand Skin:    General: Skin is warm and dry.  Neurological:     General: No focal deficit present.     Mental Status: Alert and oriented to person, place and time.     Cranial Nerves: Cranial nerves are intact.       ASSESSMENT & PLAN:    1.  Coronary artery disease involving native coronary artery of native heart with angina pectoris (HCC) s/p non-STEMI in 2016 followed by CABG. Myoview in 5/21 low risk.  He continues to have exertional chest pain at times.  This is no worse but is concerning for him.  We discussed intensifying medical therapy versus proceeding with cardiac catheterization.  I recommend proceeding with cardiac catheterization.  I discussed this with Dr. Cooper who agreed.  The patient does not take PDE-5 inhibitors.  -Arrange cardiac catheterization in the next 2 weeks with Dr. Cooper  -Start isosorbide 15 mg daily  -Continue aspirin, metoprolol tartrate, Alirocumab  -Follow-up 2 weeks after cath  2. Essential hypertension The patient's blood pressure is controlled on his current regimen.  Continue current therapy.   3. Mixed hyperlipidemia His LDL remains above goal on Alirocumab.  Our pharmacist will reach out to him regarding bempedoic acid.  Authorization is pending.    Shared Decision Making/Informed Consent The risks [stroke (1 in 1000), death (1 in 1000), kidney failure [usually temporary] (1 in 500), bleeding (1 in 200), allergic reaction [possibly serious] (1 in 200)], benefits (diagnostic support and management of coronary artery disease) and alternatives of a cardiac catheterization were discussed in detail with Hunter Gomez and he is willing to proceed.    Dispo:  Return for Post Procedure Follow Up, w/ Scott Weaver, PA-C.   Medication Adjustments/Labs and Tests Ordered: Current medicines are reviewed at length with the patient today.  Concerns regarding medicines are outlined above.  Tests  Ordered: Orders Placed This Encounter  Procedures  . Basic Metabolic Panel (BMET)  . CBC  . EKG 12-Lead   Medication Changes: Meds ordered this encounter  Medications  . isosorbide mononitrate (IMDUR) 30 MG 24 hr tablet    Sig: Take 0.5 tablets (15 mg total) by mouth daily.    Dispense:  15 tablet    Refill:  3    Signed, Scott Weaver, PA-C  02/01/2021 9:53 AM    [facility] [address], [place], [place]  [address] Phone: [phone]; Fax: [phone]    

## 2021-02-01 ENCOUNTER — Ambulatory Visit (INDEPENDENT_AMBULATORY_CARE_PROVIDER_SITE_OTHER): Payer: Medicaid Other | Admitting: Physician Assistant

## 2021-02-01 ENCOUNTER — Other Ambulatory Visit: Payer: Self-pay

## 2021-02-01 ENCOUNTER — Telehealth: Payer: Self-pay | Admitting: Pharmacist

## 2021-02-01 ENCOUNTER — Encounter: Payer: Self-pay | Admitting: Physician Assistant

## 2021-02-01 VITALS — BP 112/70 | HR 58 | Ht 68.5 in | Wt 240.0 lb

## 2021-02-01 DIAGNOSIS — I25119 Atherosclerotic heart disease of native coronary artery with unspecified angina pectoris: Secondary | ICD-10-CM | POA: Diagnosis not present

## 2021-02-01 DIAGNOSIS — I10 Essential (primary) hypertension: Secondary | ICD-10-CM | POA: Diagnosis not present

## 2021-02-01 DIAGNOSIS — E782 Mixed hyperlipidemia: Secondary | ICD-10-CM

## 2021-02-01 DIAGNOSIS — I2510 Atherosclerotic heart disease of native coronary artery without angina pectoris: Secondary | ICD-10-CM

## 2021-02-01 LAB — CBC
Hematocrit: 45.3 % (ref 37.5–51.0)
Hemoglobin: 15 g/dL (ref 13.0–17.7)
MCH: 28.1 pg (ref 26.6–33.0)
MCHC: 33.1 g/dL (ref 31.5–35.7)
MCV: 85 fL (ref 79–97)
Platelets: 373 10*3/uL (ref 150–450)
RBC: 5.34 x10E6/uL (ref 4.14–5.80)
RDW: 12.5 % (ref 11.6–15.4)
WBC: 9.6 10*3/uL (ref 3.4–10.8)

## 2021-02-01 LAB — BASIC METABOLIC PANEL
BUN/Creatinine Ratio: 13 (ref 9–20)
BUN: 12 mg/dL (ref 6–24)
CO2: 23 mmol/L (ref 20–29)
Calcium: 9.6 mg/dL (ref 8.7–10.2)
Chloride: 99 mmol/L (ref 96–106)
Creatinine, Ser: 0.9 mg/dL (ref 0.76–1.27)
Glucose: 116 mg/dL — ABNORMAL HIGH (ref 65–99)
Potassium: 4.5 mmol/L (ref 3.5–5.2)
Sodium: 139 mmol/L (ref 134–144)
eGFR: 105 mL/min/{1.73_m2} (ref >59)

## 2021-02-01 MED ORDER — ISOSORBIDE MONONITRATE ER 30 MG PO TB24
15.0000 mg | ORAL_TABLET | Freq: Every day | ORAL | 3 refills | Status: DC
Start: 2021-02-01 — End: 2021-05-29

## 2021-02-01 MED ORDER — NEXLETOL 180 MG PO TABS: 1.0000 | ORAL_TABLET | Freq: Every day | ORAL | 3 refills | Status: DC

## 2021-02-01 NOTE — Telephone Encounter (Addendum)
New Medicaid insurance has now been scanned into Epic, prior authorization for Nexletol has been submitted and approved through 02/01/22. Rx sent to pharmacy, called pt and left message. Will need to schedule f/u labs in 3 months. He is to continue on Praluent injections, new authorization with pt's new insurance plan has been submitted and approved through 02/01/22 as well.

## 2021-02-01 NOTE — Telephone Encounter (Signed)
Thanks Aundra Millet!  Danielle - I put the order in for lipids and LFTs in 3 mos. Can you make sure it gets scheduled? Thanks [place], [place]    02/01/2021 12:28 PM

## 2021-02-01 NOTE — Patient Instructions (Signed)
Medication Instructions:  Your physician has recommended you make the following change in your medication:   1.  Start Imdur one and one half tablet ( 15 mg) daily, sent in # 15 to requested pharmacy.   *If you need a refill on your cardiac medications before your next appointment, please call your pharmacy*   Lab Work: Your physician recommends that you have lab work today: BMET/CBC  If you have labs (blood work) drawn today and your tests are completely normal, you will receive your results only by: Marland Kitchen MyChart Message (if you have MyChart) OR . A paper copy in the mail If you have any lab test that is abnormal or we need to change your treatment, we will call you to review the results.   Testing/Procedures: Your provider has recommended a cardiac catherization.   You will need a COVID test prior to your procedure - Go to Pre-Procedural COVID -19 testing at [address] [place] in [place], [place] [address] on _Monday March 21 @ 11:50 am.    Bonita Quin are scheduled for a cardiac catheterization on Wednesday, March 23 with Dr. Excell Seltzer or associate.  Please arrive at the [facility] (Main Entrance) at [facility] at [address], [place] -  2nd Floor Short Stay on Wednesday  at 10:00 am.  Free valet parking is available.   You are allowed only one visitor in the hospital with you. Both you and your visitor must wear masks.    Special note: Every effort is made to have your procedure done on time.   Please understand that emergencies sometimes delay a scheduled   procedure.  No solid foods after midnight on Tuesday, March 22. You may have clear liquids until 5 am on the day of your procedure.    You may take your morning medications with a sip of water on the day of your procedure.  Please take a baby aspirin (81 mg) on the morning of your procedure.    Plan for a one night stay -- bring personal belongings.  Bring a current list of your medications and current insurance cards.   You MUST have a responsible person to drive you home. Someone MUST be with you the first 24 hours after you arrive home or your discharge will be delayed. Wear clothes that are easy to get on and off and wear slip on shoes.    Coronary Angiogram A coronary angiogram, also called coronary angiography, is an X-ray procedure used to look at the arteries in the heart. In this procedure, a dye (contrast dye) is injected through a long, hollow tube (catheter). The catheter is about the size of a piece of cooked spaghetti and is inserted through your groin, wrist, or arm. The dye is injected into each artery, and X-rays are then taken to show if there is a blockage in the arteries of your heart.  LET [facility] CARE PROVIDER KNOW ABOUT:  Any allergies you have, including allergies to shellfish or contrast dye.    All medicines you are taking, including vitamins, herbs, eye drops, creams, and over-the-counter medicines.    Previous problems you or members of your family have had with the use of anesthetics.    Any blood disorders you have.    Previous surgeries you have had.  History of kidney problems or failure.    Other medical conditions you have.  RISKS AND COMPLICATIONS  Generally, a coronary angiogram is a safe procedure. However, about 1 person out  of 1000 can have problems that may include:  Allergic reaction to the dye.  Bleeding/bruising from the access site or other locations.  Kidney injury, especially in people with impaired kidney function.   Stroke (rare).  Heart attack (rare).  Irregular rhythms (rare)  Death (rare)  BEFORE THE PROCEDURE   Do not eat or drink anything after midnight the night before the procedure or as directed by your health care provider.    Ask your health care provider about changing or stopping your regular medicines. This is especially important if you are taking diabetes medicines or blood thinners.  PROCEDURE  You may be given a  medicine to help you relax (sedative) before the procedure. This medicine is given through an intravenous (IV) access tube that is inserted into one of your veins.    The area where the catheter will be inserted will be washed and shaved. This is usually done in the groin but may be done in the fold of your arm (near your elbow) or in the wrist.     A medicine will be given to numb the area where the catheter will be inserted (local anesthetic).    The health care provider will insert the catheter into an artery. The catheter will be guided by using a special type of X-ray (fluoroscopy) of the blood vessel being examined.    A special dye will then be injected into the catheter, and X-rays will be taken. The dye will help to show where any narrowing or blockages are located in the heart arteries.     AFTER THE PROCEDURE   If the procedure is done through the leg, you will be kept in bed lying flat for several hours. You will be instructed to not bend or cross your legs.  The insertion site will be checked frequently.    The pulse in your feet or wrist will be checked frequently.    Additional blood tests, X-rays, and an electrocardiogram may be done.       Follow-Up: At [facility], you and your health needs are our priority.  As part of our continuing mission to provide you with exceptional heart care, we have created designated Provider Care Teams.  These Care Teams include your primary Cardiologist (physician) and Advanced Practice Providers (APPs -  Physician Assistants and Nurse Practitioners) who all work together to provide you with the care you need, when you need it.  We recommend signing up for the patient portal called "MyChart".  Sign up information is provided on this After Visit Summary.  MyChart is used to connect with patients for Virtual Visits (Telemedicine).  Patients are able to view lab/test results, encounter notes, upcoming appointments, etc.  Non-urgent messages  can be sent to your provider as well.   To learn more about what you can do with MyChart, go to [email or web address].    Your next appointment:   Your physician recommends that you keep your scheduled  follow-up appointment with [name], PA on Wednesday, April 6 @ 11:45 am

## 2021-02-11 ENCOUNTER — Other Ambulatory Visit (HOSPITAL_COMMUNITY)
Admission: RE | Admit: 2021-02-11 | Discharge: 2021-02-11 | Disposition: A | Discharge status: Home / Self Care | Payer: Medicaid Other | Source: Ambulatory Visit | Attending: Cardiovascular Disease | Admitting: Cardiovascular Disease

## 2021-02-11 DIAGNOSIS — Z20822 Contact with and (suspected) exposure to covid-19: Secondary | ICD-10-CM | POA: Insufficient documentation

## 2021-02-11 DIAGNOSIS — Z01812 Encounter for preprocedural laboratory examination: Secondary | ICD-10-CM | POA: Insufficient documentation

## 2021-02-11 LAB — SARS CORONAVIRUS 2 (TAT 6-24 HRS): SARS Coronavirus 2: NEGATIVE

## 2021-02-12 ENCOUNTER — Telehealth: Payer: Self-pay | Admitting: *Deleted

## 2021-02-12 NOTE — Telephone Encounter (Addendum)
Pt contacted pre-catheterization scheduled at [facility] for: Wednesday February 13, 2021 12 Noon Verified arrival time and place: [facility]tr[facility]) at: 10 AM   No solid food after midnight prior to cath, clear liquids until 5 AM day of procedure.  Hold: HCTZ-AM of procedure  Except hold medications AM meds can be  taken pre-cath with sips of water including: ASA 81 mg   Confirmed patient has responsible adult to drive home post procedure and be with patient first 24 hours after arriving home: yes  You are allowed ONE visitor in the waiting room during the time you are at the hospital for your procedure. Both you and your visitor must wear a mask once you enter the hospital.    Reviewed procedure/mask/visitor instructions with patient.

## 2021-02-13 ENCOUNTER — Encounter (HOSPITAL_COMMUNITY)
Admission: RE | Disposition: A | Discharge status: Home / Self Care | Payer: Self-pay | Source: Home / Self Care | Attending: Cardiovascular Disease

## 2021-02-13 ENCOUNTER — Other Ambulatory Visit: Payer: Self-pay | Admitting: Cardiology

## 2021-02-13 ENCOUNTER — Ambulatory Visit (HOSPITAL_COMMUNITY)
Admission: RE | Admit: 2021-02-13 | Discharge: 2021-02-13 | Disposition: A | Discharge status: Home / Self Care | Payer: Medicaid Other | Attending: Cardiovascular Disease | Admitting: Cardiovascular Disease

## 2021-02-13 DIAGNOSIS — Z7982 Long term (current) use of aspirin: Secondary | ICD-10-CM | POA: Diagnosis not present

## 2021-02-13 DIAGNOSIS — Z951 Presence of aortocoronary bypass graft: Secondary | ICD-10-CM | POA: Insufficient documentation

## 2021-02-13 DIAGNOSIS — E785 Hyperlipidemia, unspecified: Secondary | ICD-10-CM | POA: Diagnosis not present

## 2021-02-13 DIAGNOSIS — E782 Mixed hyperlipidemia: Secondary | ICD-10-CM | POA: Insufficient documentation

## 2021-02-13 DIAGNOSIS — I2582 Chronic total occlusion of coronary artery: Secondary | ICD-10-CM | POA: Insufficient documentation

## 2021-02-13 DIAGNOSIS — Z8249 Family history of ischemic heart disease and other diseases of the circulatory system: Secondary | ICD-10-CM | POA: Insufficient documentation

## 2021-02-13 DIAGNOSIS — I10 Essential (primary) hypertension: Secondary | ICD-10-CM | POA: Diagnosis not present

## 2021-02-13 DIAGNOSIS — Z79899 Other long term (current) drug therapy: Secondary | ICD-10-CM | POA: Diagnosis not present

## 2021-02-13 DIAGNOSIS — I25119 Atherosclerotic heart disease of native coronary artery with unspecified angina pectoris: Secondary | ICD-10-CM | POA: Insufficient documentation

## 2021-02-13 DIAGNOSIS — R9439 Abnormal result of other cardiovascular function study: Secondary | ICD-10-CM | POA: Diagnosis present

## 2021-02-13 DIAGNOSIS — I25708 Atherosclerosis of coronary artery bypass graft(s), unspecified, with other forms of angina pectoris: Secondary | ICD-10-CM

## 2021-02-13 HISTORY — PX: CORONARY STENT INTERVENTION: CATH118234

## 2021-02-13 HISTORY — PX: LEFT HEART CATH AND CORS/GRAFTS ANGIOGRAPHY: CATH118250

## 2021-02-13 LAB — POCT ACTIVATED CLOTTING TIME: Activated Clotting Time: 279 seconds

## 2021-02-13 SURGERY — LEFT HEART CATH AND CORS/GRAFTS ANGIOGRAPHY
Anesthesia: LOCAL

## 2021-02-13 MED ORDER — CLOPIDOGREL BISULFATE 75 MG PO TABS: 75.0000 mg | ORAL_TABLET | Freq: Every day | ORAL | 2 refills | Status: DC

## 2021-02-13 MED ORDER — FENTANYL CITRATE (PF) 100 MCG/2ML IJ SOLN
INTRAMUSCULAR | Status: AC
  Filled 2021-02-13: qty 2

## 2021-02-13 MED ORDER — SODIUM CHLORIDE 0.9 % WEIGHT BASED INFUSION: 1.0000 mL/kg/h | INTRAVENOUS | Status: DC

## 2021-02-13 MED ORDER — CLOPIDOGREL BISULFATE 300 MG PO TABS
ORAL_TABLET | ORAL | Status: AC
  Filled 2021-02-13: qty 2

## 2021-02-13 MED ORDER — SODIUM CHLORIDE 0.9 % WEIGHT BASED INFUSION
3.0000 mL/kg/h | INTRAVENOUS | Status: AC
  Administered 2021-02-13: 3 mL/kg/h via INTRAVENOUS

## 2021-02-13 MED ORDER — SODIUM CHLORIDE 0.9% FLUSH: 3.0000 mL | Freq: Two times a day (BID) | INTRAVENOUS | Status: DC

## 2021-02-13 MED ORDER — IOHEXOL 350 MG/ML SOLN
INTRAVENOUS | Status: DC | PRN
  Administered 2021-02-13: 125 mL

## 2021-02-13 MED ORDER — HEPARIN SODIUM (PORCINE) 1000 UNIT/ML IJ SOLN
INTRAMUSCULAR | Status: AC
  Filled 2021-02-13: qty 1

## 2021-02-13 MED ORDER — HEPARIN SODIUM (PORCINE) 1000 UNIT/ML IJ SOLN
INTRAMUSCULAR | Status: DC | PRN
  Administered 2021-02-13 (×2): 5000 [IU] via INTRAVENOUS
  Administered 2021-02-13: 2000 [IU] via INTRAVENOUS

## 2021-02-13 MED ORDER — ACTIVE PARTNERSHIP FOR HEALTH OF YOUR HEART BOOK
Status: AC
  Filled 2021-02-13: qty 1

## 2021-02-13 MED ORDER — SODIUM CHLORIDE 0.9% FLUSH: 3.0000 mL | INTRAVENOUS | Status: DC | PRN

## 2021-02-13 MED ORDER — NITROGLYCERIN 1 MG/10 ML FOR IR/CATH LAB
INTRA_ARTERIAL | Status: AC
  Filled 2021-02-13: qty 10

## 2021-02-13 MED ORDER — HEART ATTACK BOUNCING BOOK
Status: AC
  Filled 2021-02-13: qty 1

## 2021-02-13 MED ORDER — ANGIOPLASTY BOOK
Status: AC
  Filled 2021-02-13: qty 1

## 2021-02-13 MED ORDER — NITROGLYCERIN 1 MG/10 ML FOR IR/CATH LAB
INTRA_ARTERIAL | Status: DC | PRN
  Administered 2021-02-13: 150 ug via INTRACORONARY
  Administered 2021-02-13: 200 ug via INTRACORONARY

## 2021-02-13 MED ORDER — IOHEXOL 350 MG/ML SOLN
INTRAVENOUS | Status: AC
  Filled 2021-02-13: qty 1

## 2021-02-13 MED ORDER — ASPIRIN 81 MG PO CHEW: 81.0000 mg | CHEWABLE_TABLET | ORAL | Status: DC

## 2021-02-13 MED ORDER — VERAPAMIL HCL 2.5 MG/ML IV SOLN
INTRAVENOUS | Status: AC
  Filled 2021-02-13: qty 2

## 2021-02-13 MED ORDER — MIDAZOLAM HCL 2 MG/2ML IJ SOLN
INTRAMUSCULAR | Status: AC
  Filled 2021-02-13: qty 2

## 2021-02-13 MED ORDER — FENTANYL CITRATE (PF) 100 MCG/2ML IJ SOLN
INTRAMUSCULAR | Status: DC | PRN
  Administered 2021-02-13: 25 ug via INTRAVENOUS

## 2021-02-13 MED ORDER — ONDANSETRON HCL 4 MG/2ML IJ SOLN: 4.0000 mg | Freq: Four times a day (QID) | INTRAMUSCULAR | Status: DC | PRN

## 2021-02-13 MED ORDER — HEPARIN (PORCINE) IN NACL 1000-0.9 UT/500ML-% IV SOLN
INTRAVENOUS | Status: DC | PRN
  Administered 2021-02-13 (×2): 500 mL

## 2021-02-13 MED ORDER — EXERCISE FOR HEART AND HEALTH BOOK
Status: AC
  Filled 2021-02-13: qty 1

## 2021-02-13 MED ORDER — ACETAMINOPHEN 325 MG PO TABS: 650.0000 mg | ORAL_TABLET | ORAL | Status: DC | PRN

## 2021-02-13 MED ORDER — LABETALOL HCL 5 MG/ML IV SOLN: 10.0000 mg | INTRAVENOUS | Status: DC | PRN

## 2021-02-13 MED ORDER — LIDOCAINE HCL (PF) 1 % IJ SOLN
INTRAMUSCULAR | Status: AC
  Filled 2021-02-13: qty 30

## 2021-02-13 MED ORDER — FAMOTIDINE IN NACL 20-0.9 MG/50ML-% IV SOLN
INTRAVENOUS | Status: AC
  Filled 2021-02-13: qty 50

## 2021-02-13 MED ORDER — CLOPIDOGREL BISULFATE 300 MG PO TABS
ORAL_TABLET | ORAL | Status: DC | PRN
  Administered 2021-02-13: 600 mg via ORAL

## 2021-02-13 MED ORDER — CLOPIDOGREL BISULFATE 75 MG PO TABS: 75.0000 mg | ORAL_TABLET | Freq: Every day | ORAL | Status: DC

## 2021-02-13 MED ORDER — SODIUM CHLORIDE 0.9 % IV SOLN: 250.0000 mL | INTRAVENOUS | Status: DC | PRN

## 2021-02-13 MED ORDER — CLOPIDOGREL BISULFATE 75 MG PO TABS: 75.0000 mg | ORAL_TABLET | Freq: Every day | ORAL | 0 refills | Status: DC

## 2021-02-13 MED ORDER — MIDAZOLAM HCL 2 MG/2ML IJ SOLN
INTRAMUSCULAR | Status: DC | PRN
  Administered 2021-02-13: 2 mg via INTRAVENOUS

## 2021-02-13 MED ORDER — LIDOCAINE HCL (PF) 1 % IJ SOLN
INTRAMUSCULAR | Status: DC | PRN
  Administered 2021-02-13: 2 mL

## 2021-02-13 MED ORDER — HYDRALAZINE HCL 20 MG/ML IJ SOLN: 10.0000 mg | INTRAMUSCULAR | Status: DC | PRN

## 2021-02-13 MED ORDER — HEPARIN (PORCINE) IN NACL 1000-0.9 UT/500ML-% IV SOLN
INTRAVENOUS | Status: AC
  Filled 2021-02-13: qty 1000

## 2021-02-13 MED ORDER — VERAPAMIL HCL 2.5 MG/ML IV SOLN
INTRAVENOUS | Status: DC | PRN
  Administered 2021-02-13: 10 mL via INTRA_ARTERIAL

## 2021-02-13 MED FILL — CLOPIDOGREL 75 MG TABLET: 75 | 90 days supply | Qty: 90 | Fill #0

## 2021-02-13 SURGICAL SUPPLY — 23 items
BALLN SAPPHIRE 2.0X15 (BALLOONS) ×2
BALLN SAPPHIRE ~~LOC~~ 2.75X12 (BALLOONS) ×2 IMPLANT
BALLOON SAPPHIRE 2.0X15 (BALLOONS) ×1 IMPLANT
CATH EXPO 5F MPA-1 (CATHETERS) ×2 IMPLANT
CATH INFINITI 5FR AL1 (CATHETERS) ×2 IMPLANT
CATH INFINITI 5FR MULTPACK ANG (CATHETERS) ×2 IMPLANT
CATH LAUNCHER 6FR JR4 (CATHETERS) ×2 IMPLANT
DEVICE RAD COMP TR BAND LRG (VASCULAR PRODUCTS) ×2 IMPLANT
GLIDESHEATH SLEND SS 6F .021 (SHEATH) ×2 IMPLANT
GUIDEWIRE INQWIRE 1.5J.035X260 (WIRE) ×1 IMPLANT
INQWIRE 1.5J .035X260CM (WIRE) ×2
KIT ENCORE 40 (KITS) ×2 IMPLANT
KIT ESSENTIALS PG (KITS) ×2 IMPLANT
KIT HEART LEFT (KITS) ×2 IMPLANT
PACK CARDIAC CATHETERIZATION (CUSTOM PROCEDURE TRAY) ×2 IMPLANT
SHEATH PINNACLE 5F 10CM (SHEATH) IMPLANT
SHEATH PROBE COVER 6X72 (BAG) ×2 IMPLANT
SHIELD RADPAD SCOOP 12X17 (MISCELLANEOUS) ×2 IMPLANT
STENT RESOLUTE ONYX 2.5X22 (Permanent Stent) ×2 IMPLANT
TRANSDUCER W/STOPCOCK (MISCELLANEOUS) ×2 IMPLANT
TUBING CIL FLEX 10 FLL-RA (TUBING) ×2 IMPLANT
WIRE COUGAR XT STRL 190CM (WIRE) ×2 IMPLANT
WIRE EMERALD 3MM-J .035X150CM (WIRE) IMPLANT

## 2021-02-13 NOTE — Interval H&P Note (Signed)
History and Physical Interval Note:  02/13/2021 2:01 PM  Hunter Gomez  has presented today for surgery, with the diagnosis of CAD w/ angina.  The various methods of treatment have been discussed with the patient and family. After consideration of risks, benefits and other options for treatment, the patient has consented to  Procedure(s): LEFT HEART CATH AND CORS/GRAFTS ANGIOGRAPHY (N/A) as a surgical intervention.  The patient's history has been reviewed, patient examined, no change in status, stable for surgery.  I have reviewed the patient's chart and labs.  Questions were answered to the patient's satisfaction.     Tonny Bollman

## 2021-02-13 NOTE — Discharge Summary (Signed)
Discharge Summary for Same Day PCI   Patient ID: Hunter Gomez MRN: [id number]; DOB: 1971-06-11  Admit date: 02/13/2021 Discharge date: 02/13/2021  Primary Care Provider: Carlena Hurl, PA-C  Primary Cardiologist: Sherren Mocha, MD Primary Electrophysiologist:  None   Discharge Diagnoses    Active Problems:   Abnormal nuclear stress test    Diagnostic Studies/Procedures    Cardiac Catheterization 02/13/2021:  1.  Severe native vessel coronary artery disease with total occlusion of the left main and severe stenosis of the distal RCA 2.  Status post aortocoronary bypass surgery with continued patency of the LIMA to LAD and saphenous vein graft to diagonal.  The saphenous vein graft to diagonal supplies the ramus intermedius and native left circumflex in retrograde fashion.  Those vessels both have significant obstructive disease that is unapproachable because of left main total occlusion 3.  Total occlusion of the saphenous vein graft OM, saphenous vein graft to ramus intermedius, and free RIMA to RCA 4.  Successful PCI of the distal RCA, reducing 90% stenosis to 0% with a 2.5 x 22 mm resolute Onyx DES  Recommendations: Same-day PCI protocol if criteria met, ongoing aggressive medical therapy, dual antiplatelet therapy with aspirin and clopidogrel at least 6 months (favor long-term if tolerated in this young patient with bypass graft failure and extensive CAD)   _____________   History of Present Illness     Hunter Gomez is a 50 y.o. male with PMH of CAD s/p NSTEMI '16 with emergent CABG, HTN, HLD, gallstone pancreatitis s/p ERCP with cholecystectomy, chronic diastolic function who was recently in the office on 3/11 chest pain. At this visit he reported ongoing episodes of chest discomfort with certain types of activities. Walking up the ramp into the [facility] causes symptoms.  He also remembered walking up a hill in [place] after attending a race that caused the  same symptoms.  They reminded him somewhat of his previous angina.  He really had not had significant shortness of breath.  He had not tried nitroglycerin.  He was concerned about his symptoms.  His brother was just diagnosed with multivessel coronary disease.  He has not had syncope, orthopnea, leg edema. Case was discussed with Dr. Burt Knack with recommendation for outpatient cardiac cath. He was started on Imdur 63m daily in the interim.   Hospital Course     The patient underwent cardiac cath as noted above with severe native coronary artery disease with total occlusion of LM and severe stenosis of dRCA with patent LIMA-LAD, SVG-Diag. Total occlusion of SVG-OM, SVG-RI and free RIMA-RCA. SVG-Diag supplies the RI and LCx vessel retrograde. Successful PCI of the distal RCA with DES x1. Plan for DAPT with ASA/plavix for at least 6 months but favor longer given young age and graft failure with extensive CAD. There were no observed complications post cath. Radial cath site was re-evaluated prior to discharge and found to be stable without any complications.  Instructions/precautions regarding cath site care were given prior to discharge. He was continued on home medications including ASA 852m HCTZ 2560maily, Lisinopril 31m68mily, Metoprolol 50mg92mly, Nexletol 180mg 41my as well as Praluent. I advised he could stop his Imdur 15mg d40m for the time being.   BradleyMonika Salken by Dr. Cooper Burt Knacktermined stable for discharge home. Follow up with our office has been arranged. Medications are listed below. Pertinent changes include addition of plavix. Phase II ordered at discharge for cardiac rehab to follow up as an outpatient.  _____________  Cath/PCI Registry Performance & Quality Measures: 1. Aspirin prescribed? - Yes 2. ADP Receptor Inhibitor (Plavix/Clopidogrel, Brilinta/Ticagrelor or Effient/Prasugrel) prescribed (includes medically managed patients)? - Yes 3. High Intensity Statin (Lipitor  40-28m or Crestor 20-497m prescribed? - No - on PCSK9 4. For EF <40%, was ACEI/ARB prescribed? - Yes 5. For EF <40%, Aldosterone Antagonist (Spironolactone or Eplerenone) prescribed? - Not Applicable (EF >/= 4029%6. Cardiac Rehab Phase II ordered (Included Medically managed Patients)? - Yes  _____________   Discharge Vitals Blood pressure (!) 147/72, pulse (!) 56, temperature 98.3 F (36.8 C), height 5' 8.5" (1.74 m), weight 108.9 kg, SpO2 100 %.  Filed Weights   02/13/21 1023  Weight: 108.9 kg    Last Labs & Radiologic Studies    CBC No results for input(s): WBC, NEUTROABS, HGB, HCT, MCV, PLT in the last 72 hours. Basic Metabolic Panel No results for input(s): NA, K, CL, CO2, GLUCOSE, BUN, CREATININE, CALCIUM, MG, PHOS in the last 72 hours. Liver Function Tests No results for input(s): AST, ALT, ALKPHOS, BILITOT, PROT, ALBUMIN in the last 72 hours. No results for input(s): LIPASE, AMYLASE in the last 72 hours. High Sensitivity Troponin:   No results for input(s): TROPONINIHS in the last 720 hours.  BNP Invalid input(s): POCBNP D-Dimer No results for input(s): DDIMER in the last 72 hours. Hemoglobin A1C No results for input(s): HGBA1C in the last 72 hours. Fasting Lipid Panel No results for input(s): CHOL, HDL, LDLCALC, TRIG, CHOLHDL, LDLDIRECT in the last 72 hours. Thyroid Function Tests No results for input(s): TSH, T4TOTAL, T3FREE, THYROIDAB in the last 72 hours.  Invalid input(s): FREET3 _____________  CARDIAC CATHETERIZATION  Result Date: 02/13/2021 1.  Severe native vessel coronary artery disease with total occlusion of the left main and severe stenosis of the distal RCA 2.  Status post aortocoronary bypass surgery with continued patency of the LIMA to LAD and saphenous vein graft to diagonal.  The saphenous vein graft to diagonal supplies the ramus intermedius and native left circumflex in retrograde fashion.  Those vessels both have significant obstructive disease  that is unapproachable because of left main total occlusion 3.  Total occlusion of the saphenous vein graft OM, saphenous vein graft to ramus intermedius, and free RIMA to RCA 4.  Successful PCI of the distal RCA, reducing 90% stenosis to 0% with a 2.5 x 22 mm resolute Onyx DES Recommendations: Same-day PCI protocol if criteria met, ongoing aggressive medical therapy, dual antiplatelet therapy with aspirin and clopidogrel at least 6 months (favor long-term if tolerated in this young patient with bypass graft failure and extensive CAD)   Disposition   Pt is being discharged home today in good condition.  Follow-up Plans & Appointments     Follow-up Information    WeLiliane ShiPA-C Follow up on 02/27/2021.   Specialties: Cardiology, Physician Assistant Why: at 11:45am for your follow up appt Contact information: [phone]            Discharge Instructions    PHASE II - CARDIAC REHAB   Complete by: As directed        Discharge Medications   Allergies as of 02/13/2021      Reactions   Atorvastatin Diarrhea   Rosuvastatin also       Medication List    TAKE these medications   aspirin 81 MG tablet Take 81 mg by mouth daily.   clopidogrel 75 MG tablet Commonly known as:  Plavix Take 1 tablet (75 mg total) by mouth daily.   hydrochlorothiazide 25 MG tablet Commonly known as: HYDRODIURIL Take 1 tablet (25 mg total) by mouth daily.   isosorbide mononitrate 30 MG 24 hr tablet Commonly known as: IMDUR Take 0.5 tablets (15 mg total) by mouth daily.   lisinopril 20 MG tablet Commonly known as: ZESTRIL Take 1 tablet (20 mg total) by mouth daily. What changed: when to take this   metoprolol tartrate 50 MG tablet Commonly known as: LOPRESSOR Take 1 tablet (50 mg total) by mouth 2 (two) times daily with a meal.   Nexletol 180 MG Tabs Generic drug: Bempedoic Acid Take 1 tablet by mouth daily. What changed:  how much to take   nitroGLYCERIN 0.4 MG SL tablet Commonly known as: NITROSTAT Place 1 tablet (0.4 mg total) under the tongue every 5 (five) minutes as needed for chest pain.   Praluent 150 MG/ML Soaj Generic drug: Alirocumab Inject 1 pen into the skin every 14 (fourteen) days. What changed: how much to take   tamsulosin 0.4 MG Caps capsule Commonly known as: FLOMAX TAKE 1 CAPSULE(0.4 MG) BY MOUTH DAILY AFTER BREAKFAST What changed:   how much to take  how to take this  when to take this  additional instructions          Allergies Allergies  Allergen Reactions  . Atorvastatin Diarrhea    Rosuvastatin also     Outstanding Labs/Studies   N/a   Duration of Discharge Encounter   Greater than 30 minutes including physician time.  Signed, Reino Bellis, NP 02/13/2021, 4:55 PM

## 2021-02-13 NOTE — Progress Notes (Signed)
    S:  Pt s/p PCI via L radial access.  No c/p or dyspnea.  L wrist feels well.  Pending d/c in 2100 tonight.  O:   Vitals:   02/13/21 1800 02/13/21 1901  BP: 106/70 111/81  Pulse: (!) 56 60  Temp:    SpO2: 95% 97%   Pleasant, NAD, AAOx3.  L radial cath site w/o bleeding/bruit/hematoma.  Nl cap refill.  L radial/ulnar pulses 2+.  A/P: 1.  CAD:  S/p PCI/DES to the RCA earlier today.  L wrist cath/pci site looks good w/o complication.  Stable for d/c this evening.  He has outpt f/u on 02/27/2021.  Nicolasa Ducking, NP

## 2021-02-13 NOTE — Discharge Instructions (Signed)
Radial Site Care  This sheet gives you information about how to care for yourself after your procedure. Your health care provider may also give you more specific instructions. If you have problems or questions, contact your health care provider. What can I expect after the procedure? After the procedure, it is common to have:  Bruising and tenderness at the catheter insertion area. Follow these instructions at home: Medicines  Take over-the-counter and prescription medicines only as told by your health care provider. Insertion site care  Follow instructions from your health care provider about how to take care of your insertion site. Make sure you: ? Wash your hands with soap and water before you change your bandage (dressing). If soap and water are not available, use hand sanitizer. ? Change your dressing as told by your health care provider. ? Leave stitches (sutures), skin glue, or adhesive strips in place. These skin closures may need to stay in place for 2 weeks or longer. If adhesive strip edges start to loosen and curl up, you may trim the loose edges. Do not remove adhesive strips completely unless your health care provider tells you to do that.  Check your insertion site every day for signs of infection. Check for: ? Redness, swelling, or pain. ? Fluid or blood. ? Pus or a bad smell. ? Warmth.  Do not take baths, swim, or use a hot tub until your health care provider approves.  You may shower 24-48 hours after the procedure, or as directed by your health care provider. ? Remove the dressing and gently wash the site with plain soap and water. ? Pat the area dry with a clean towel. ? Do not rub the site. That could cause bleeding.  Do not apply powder or lotion to the site. Activity  For 24 hours after the procedure, or as directed by your health care provider: ? Do not flex or bend the affected arm. ? Do not push or pull heavy objects with the affected arm. ? Do not drive  yourself home from the hospital or clinic. You may drive 24 hours after the procedure unless your health care provider tells you not to. ? Do not operate machinery or power tools.  Do not lift anything that is heavier than 10 lb (4.5 kg), or the limit that you are told, until your health care provider says that it is safe.  Ask your health care provider when it is okay to: ? Return to work or school. ? Resume usual physical activities or sports. ? Resume sexual activity.   General instructions  If the catheter site starts to bleed, raise your arm and put firm pressure on the site. If the bleeding does not stop, get help right away. This is a medical emergency.  If you went home on the same day as your procedure, a responsible adult should be with you for the first 24 hours after you arrive home.  Keep all follow-up visits as told by your health care provider. This is important. Contact a health care provider if:  You have a fever.  You have redness, swelling, or yellow drainage around your insertion site. Get help right away if:  You have unusual pain at the radial site.  The catheter insertion area swells very fast.  The insertion area is bleeding, and the bleeding does not stop when you hold steady pressure on the area.  Your arm or hand becomes pale, cool, tingly, or numb. These symptoms may represent a serious   problem that is an emergency. Do not wait to see if the symptoms will go away. Get medical help right away. Call your local emergency services (911 in the [place] Do not drive yourself to the hospital. Summary  After the procedure, it is common to have bruising and tenderness at the site.  Follow instructions from your health care provider about how to take care of your radial site wound. Check the wound every day for signs of infection.  Do not lift anything that is heavier than 10 lb (4.5 kg), or the limit that you are told, until your health care provider says that it  is safe. This information is not intended to replace advice given to you by your health care provider. Make sure you discuss any questions you have with your health care provider. Document Revised: 12/16/2017 Document Reviewed: 12/16/2017 Elsevier Patient Education  2021 Elsevier Inc.  

## 2021-02-14 ENCOUNTER — Encounter (HOSPITAL_COMMUNITY): Payer: Self-pay | Admitting: Cardiovascular Disease

## 2021-02-18 ENCOUNTER — Telehealth (HOSPITAL_COMMUNITY): Payer: Self-pay | Admitting: *Deleted

## 2021-02-18 DIAGNOSIS — Z955 Presence of coronary angioplasty implant and graft: Secondary | ICD-10-CM

## 2021-02-19 ENCOUNTER — Telehealth (HOSPITAL_COMMUNITY): Payer: Self-pay

## 2021-02-19 NOTE — Telephone Encounter (Signed)
Pt insurance is active and benefits verified through [facility] Medicaid Co-pay 0, DED 0/0 met, out of pocket 0/0 met, co-insurance 0%. no pre-authorization required. [name], [name]/[employer] 02/19/2021'@10' :13am, REF# [id number]  Will contact patient to see if he is interested in the Cardiac Rehab Program. If interested, patient will need to complete follow up appt. Once completed, patient will be contacted for scheduling upon review by the RN Navigator.

## 2021-02-19 NOTE — Telephone Encounter (Signed)
Called patient to see if he is interested in the Cardiac Rehab Program. Patient expressed interest. Explained scheduling process and went over insurance, patient verbalized understanding. Will contact patient for scheduling once f/u has been completed.  °

## 2021-02-21 DIAGNOSIS — N401 Enlarged prostate with lower urinary tract symptoms: Secondary | ICD-10-CM | POA: Diagnosis not present

## 2021-02-21 DIAGNOSIS — R3916 Straining to void: Secondary | ICD-10-CM | POA: Diagnosis not present

## 2021-02-27 ENCOUNTER — Other Ambulatory Visit: Payer: Self-pay

## 2021-02-27 ENCOUNTER — Encounter: Payer: Self-pay | Admitting: Physician Assistant

## 2021-02-27 ENCOUNTER — Ambulatory Visit: Payer: Medicaid Other | Admitting: Physician Assistant

## 2021-02-27 VITALS — BP 110/72 | HR 59 | Ht 68.5 in | Wt 237.8 lb

## 2021-02-27 DIAGNOSIS — I10 Essential (primary) hypertension: Secondary | ICD-10-CM | POA: Diagnosis not present

## 2021-02-27 DIAGNOSIS — I25119 Atherosclerotic heart disease of native coronary artery with unspecified angina pectoris: Secondary | ICD-10-CM | POA: Diagnosis not present

## 2021-02-27 DIAGNOSIS — E782 Mixed hyperlipidemia: Secondary | ICD-10-CM | POA: Diagnosis not present

## 2021-02-27 NOTE — Patient Instructions (Addendum)
Medication Instructions:  Your physician recommends that you continue on your current medications as directed. Please refer to the Current Medication list given to you today.  *If you need a refill on your cardiac medications before your next appointment, please call your pharmacy*   Lab Work: -None  If you have labs (blood work) drawn today and your tests are completely normal, you will receive your results only by: Marland Kitchen MyChart Message (if you have MyChart) OR . A paper copy in the mail If you have any lab test that is abnormal or we need to change your treatment, we will call you to review the results.   Testing/Procedures: -None   Follow-Up: At [facility], you and your health needs are our priority.  As part of our continuing mission to provide you with exceptional heart care, we have created designated Provider Care Teams.  These Care Teams include your primary Cardiologist (physician) and Advanced Practice Providers (APPs -  Physician Assistants and Nurse Practitioners) who all work together to provide you with the care you need, when you need it.  We recommend signing up for the patient portal called "MyChart".  Sign up information is provided on this After Visit Summary.  MyChart is used to connect with patients for Virtual Visits (Telemedicine).  Patients are able to view lab/test results, encounter notes, upcoming appointments, etc.  Non-urgent messages can be sent to your provider as well.   To learn more about what you can do with MyChart, go to [email or web address].    Your next appointment:   3 month(s) Friday, July 15 @ 8:15.   The format for your next appointment:   In Person  Provider:   [name], PA-C   Other Instructions -None

## 2021-02-27 NOTE — Progress Notes (Addendum)
Cardiology Office Note:    Date:  02/27/2021   ID:  Hunter Gomez, DOB Mar 27, 1971, MRN [id number]  PCP:  [facility]  Cardiologist:  Sherren Mocha, MD   Advanced Practice Provider:  Liliane Shi, PA-C Electrophysiologist:  None       Referring MD: Carlena Hurl, PA-C   Chief Complaint:  Follow-up (S/p PCI)    Patient Profile:     Hunter Gomez is a 50 y.o. male with:   Coronary artery disease  ? S/p NSTEMI in 2016 >> s/p CABG ? Myoview 5/21: low risk  ? S/p DES to [facility] 01/2021 ? Cath: LM 100, oLAD 80, RI 9, pLCx 35, dRCA 90, RPDA 50, RPAV 50; L-LAD ok, S-D1 ok; S-OM 100, S-RI 100, RIMA-RCA 100  Hx of gallstone pancreatitis s/p ERCP, cholecystectomy   Hyperlipidemia   Hypertension  Echocardiogram 2/16: EF 55-60, Gr 2 DD  Prior CV studies: LEFT HEART CATH 02/13/2021 Narrative 1.  Severe native vessel coronary artery disease with total occlusion of the left main and severe stenosis of the distal RCA 2.  Status post aortocoronary bypass surgery with continued patency of the LIMA to LAD and saphenous vein graft to diagonal.  The saphenous vein graft to diagonal supplies the ramus intermedius and native left circumflex in retrograde fashion.  Those vessels both have significant obstructive disease that is unapproachable because of left main total occlusion 3.  Total occlusion of the saphenous vein graft OM, saphenous vein graft to ramus intermedius, and free RIMA to RCA 4.  Successful PCI of the distal RCA, reducing 90% stenosis to 0% with a 2.5 x 22 mm resolute Onyx DES  Recommendations: Same-day PCI protocol if criteria met, ongoing aggressive medical therapy, dual antiplatelet therapy with aspirin and clopidogrel at least 6 months (favor long-term if tolerated in this young patient with bypass graft failure and extensive CAD)   Myoview 04/19/2020 EF 80, no ischemia or infarction, low risk  Echo 01/10/15 Mild LVH,  EF 93-23%, grade 2 diastolic dysfunction, trivial AI, trivial TR  Cardiac Cath 12/18/14 LM: Distal 95% extending into ostium of LAD and LCx LAD: Ostial 90%, mid 30% LCx: ostial 80-90%, proximal 50% RCA: Ostial PDA 80%, ostial PLA 75% EF 55-65% >> Emergent CABG  Carotid US 12/18/14 Bilateral ICA 1-39%      History of Present Illness:    Hunter Gomez was last seen 02/01/21.  He was still having exertional chest pain and we set him up for cardiac catheterization which demonstrated total occlusion of the left main and severe stenosis in the distal RCA, patent LIMA-LAD and SVG-DX and totally occluded SVG-OM, SVG-RI and free RIMA-RCA.  He underwent PCI with a DES to the RCA.  He returns for f/u.  He is here alone.  He feels better since DC.  He has not had recurrent chest pain.  He has not had significant chest pain, leg edema.  He has some lightheadedness with standing but no syncope.  Of note, I mentioned his brother has multivessel CAD but he actually had coronary calcification on a CT study. He has not had cardiac cath in the past.      Past Medical History:  Diagnosis Date  . CAD (coronary artery disease)    a.  NSTEMI (1/16):  LHC - dLM 95, oLAD 90, mLAD 30, oCFX 80-90, pCFX 50, oPDA 80, oPLA 75, EF 55-65% >> urgent CABG// Myoview 03/2020: EF 54, no ischemia or infarction;  low risk  . Hepatic steatosis   . HLD (hyperlipidemia)   . Hx of echocardiogram    Echo (2/16):  Mild LVH, EF 55-60%, Gr 2 DD, trivial AI/TR, no effusion  . Hypertension 10/2012  . Muscle weakness 1997   prior seizure like activity, but none since; prior neurology eval, prior normal EEG 1997  . Obesity    s/p Lap Band surgery  . Vision problem    prior use of glasses, not currently    Current Medications: Current Meds  Medication Sig  . Alirocumab (PRALUENT) 150 MG/ML SOAJ Inject 1 pen into the skin every 14 (fourteen) days.  Marland Kitchen aspirin 81 MG tablet Take 81 mg by mouth daily.  . Bempedoic Acid (NEXLETOL) 180 MG  TABS Take 1 tablet by mouth daily.  . clopidogrel (PLAVIX) 75 MG tablet Take 1 tablet (75 mg total) by mouth daily.  . hydrochlorothiazide (HYDRODIURIL) 25 MG tablet Take 1 tablet (25 mg total) by mouth daily.  . isosorbide mononitrate (IMDUR) 30 MG 24 hr tablet Take 0.5 tablets (15 mg total) by mouth daily.  Marland Kitchen lisinopril (ZESTRIL) 20 MG tablet Take 1 tablet (20 mg total) by mouth daily.  . metoprolol tartrate (LOPRESSOR) 50 MG tablet Take 1 tablet (50 mg total) by mouth 2 (two) times daily with a meal.  . nitroGLYCERIN (NITROSTAT) 0.4 MG SL tablet Place 1 tablet (0.4 mg total) under the tongue every 5 (five) minutes as needed for chest pain.  . tamsulosin (FLOMAX) 0.4 MG CAPS capsule TAKE 1 CAPSULE(0.4 MG) BY MOUTH DAILY AFTER BREAKFAST     Allergies:   Atorvastatin   Social History   Tobacco Use  . Smoking status: Never Smoker  . Smokeless tobacco: Never Used  Vaping Use  . Vaping Use: Never used  Substance Use Topics  . Alcohol use: No  . Drug use: No     Family Hx: The patient's family history includes Cancer in his maternal grandfather and paternal grandmother; Diabetes in his mother; Heart disease (age of onset: 76) in his father; Heart disease (age of onset: 21) in his mother; Hyperlipidemia in his brother, father, and mother; Hypertension in his brother, father, and mother; Peripheral vascular disease in his mother; Stroke in his father. There is no history of Heart attack.  ROS   EKGs/Labs/Other Test Reviewed:    EKG:  EKG is  ordered today.  The ekg ordered today demonstrates sinus bradycardia, HR 59, normal axis, no ST-T wave changes, QTC 386  Recent Labs: 12/07/2020: ALT 26 02/01/2021: BUN 12; Creatinine, Ser 0.90; Hemoglobin 15.0; Platelets 373; Potassium 4.5; Sodium 139   Recent Lipid Panel Lab Results  Component Value Date/Time   CHOL 153 12/07/2020 08:02 AM   TRIG 175 (H) 12/07/2020 08:02 AM   HDL 33 (L) 12/07/2020 08:02 AM   CHOLHDL 4.6 12/07/2020 08:02 AM    CHOLHDL 3.0 09/09/2017 11:04 AM   LDLCALC 90 12/07/2020 08:02 AM   LDLCALC 59 09/09/2017 11:04 AM      Risk Assessment/Calculations:      Physical Exam:    VS:  BP 110/72   Pulse (!) 59   Ht 5' 8.5" (1.74 m)   Wt 237 lb 12.8 oz (107.9 kg)   SpO2 97%   BMI 35.63 kg/m     Wt Readings from Last 3 Encounters:  02/27/21 237 lb 12.8 oz (107.9 kg)  02/13/21 240 lb (108.9 kg)  02/01/21 240 lb (108.9 kg)     Constitutional:  Appearance: Healthy appearance. Not in distress.  Neck:     Vascular: JVD normal.  Pulmonary:     Breath sounds: No wheezing. No rales.  Cardiovascular:     Normal rate. Regular rhythm. Normal S1. Normal S2.     Murmurs: There is no murmur.     Comments: L wrist without hematoma Pulses:    Intact distal pulses.  Edema:    Peripheral edema absent.  Abdominal:     Palpations: Abdomen is soft. There is no hepatomegaly.  Skin:    General: Skin is warm and dry.  Neurological:     General: No focal deficit present.     Mental Status: Alert and oriented to person, place and time.     Cranial Nerves: Cranial nerves are intact.         ASSESSMENT & PLAN:    1. Coronary artery disease involving native coronary artery of native heart with angina pectoris ([place]) s/p non-STEMI in 2016 followed by CABG. Myoview in 5/21 low risk.    Recently had cardiac catheterization which demonstrated 2/5 grafts patent.  He underwent PCI with DES to the RCA.  He is doing better without recurrent chest symptoms.  He feels better than he has felt in the past year to year and a half.  We discussed the importance of dual antiplatelet therapy.  I have asked him to increase his activity.  He is cleared to start cardiac rehabilitation.  We discussed risk factor modification to help reduce his future risk.  He does not smoke and does not have diabetes.  We discussed the importance of weight loss.  I have recommended plant-based diet.  We will also continue to aggressively manage his  cholesterol.  He is now on Nexletol in addition to Alirocumab.  Follow-up lipids are pending in June.  Follow-up in 3 months.  2. Essential hypertension BP controlled.  He does have some lightheadedness with standing.  If this continues or worsens, we can cut back on his HCTZ.  3. Mixed hyperlipidemia As noted, he will continue on Alirocumab and Nexletol.  Follow-up labs pending in June.  Goal LDL <70.     Dispo:  Return in about 3 months (around 05/29/2021) for rountine 3 month follow up with Margaret Pyle.   Medication Adjustments/Labs and Tests Ordered: Current medicines are reviewed at length with the patient today.  Concerns regarding medicines are outlined above.  Tests Ordered: Orders Placed This Encounter  Procedures  . EKG 12-Lead   Medication Changes: No orders of the defined types were placed in this encounter.   Signed, Richardson Dopp, PA-C  02/27/2021 5:38 PM    [facility] [address], [place], [place]  [address] Phone: [phone]; Fax: [phone]

## 2021-04-15 ENCOUNTER — Telehealth (HOSPITAL_COMMUNITY): Payer: Self-pay | Admitting: Student-PharmD

## 2021-04-23 ENCOUNTER — Telehealth (HOSPITAL_COMMUNITY): Payer: Self-pay | Admitting: *Deleted

## 2021-04-23 NOTE — Telephone Encounter (Signed)
Left message to call cardiac rehab regarding cardiac rehab orientation.Gladstone Lighter, RN,BSN 04/23/2021 8:42 AM

## 2021-04-24 ENCOUNTER — Encounter (HOSPITAL_COMMUNITY)
Admission: RE | Admit: 2021-04-24 | Discharge: 2021-04-24 | Disposition: A | Discharge status: Home / Self Care | Payer: Medicaid Other | Source: Ambulatory Visit | Attending: Cardiovascular Disease | Admitting: Cardiovascular Disease

## 2021-04-24 ENCOUNTER — Telehealth (HOSPITAL_COMMUNITY): Payer: Self-pay | Admitting: *Deleted

## 2021-04-24 ENCOUNTER — Other Ambulatory Visit: Payer: Self-pay

## 2021-04-24 DIAGNOSIS — Z955 Presence of coronary angioplasty implant and graft: Secondary | ICD-10-CM | POA: Insufficient documentation

## 2021-04-24 NOTE — Telephone Encounter (Signed)
Spoke with the patient confirmed appointment for orientation. Completed partial health history will complete the rest tomorrow in person.Gladstone Lighter, RN,BSN 04/24/2021 2:43 PM

## 2021-04-25 ENCOUNTER — Encounter (HOSPITAL_COMMUNITY): Payer: Self-pay

## 2021-04-25 ENCOUNTER — Encounter (HOSPITAL_COMMUNITY)
Admission: RE | Admit: 2021-04-25 | Discharge: 2021-04-25 | Disposition: A | Discharge status: Home / Self Care | Payer: Medicaid Other | Source: Ambulatory Visit | Attending: Cardiovascular Disease | Admitting: Cardiovascular Disease

## 2021-04-25 VITALS — BP 122/70 | HR 62 | Ht 69.0 in | Wt 238.3 lb

## 2021-04-25 DIAGNOSIS — Z955 Presence of coronary angioplasty implant and graft: Secondary | ICD-10-CM

## 2021-04-25 NOTE — Progress Notes (Signed)
Cardiac Rehab Medication Review by a Nurse  Does the patient  feel that his/her medications are working for him/her?  yes  Has the patient been experiencing any side effects to the medications prescribed?  no  Does the patient measure his/her own blood pressure or blood glucose at home?  yes   Does the patient have any problems obtaining medications due to transportation or finances?   no  Understanding of regimen: good Understanding of indications: good Potential of compliance: good   Nurse comments: Hunter Gomez is taking his medications as prescribed. Patient reports he feels lightheaded when changing positions. Orthostatic BP's checked today. Patient checks his BP's at home.    Thayer Headings RN BSN 04/25/2021 8:59 AM

## 2021-04-25 NOTE — Progress Notes (Signed)
Cardiac Individual Treatment Plan  Patient Details  Name: Hunter Gomez MRN: [id number] Date of Birth: [birth year] Referring Provider:   Flowsheet Row CARDIAC REHAB PHASE II ORIENTATION from 04/25/2021 in [facility] CARDIAC REHAB  Referring Provider Tonny Bollman, MD      Initial Encounter Date:  Flowsheet Row CARDIAC REHAB PHASE II ORIENTATION from 04/25/2021 in [facility] CARDIAC REHAB  Date 04/25/21      Visit Diagnosis: 02/13/21 S/P DES RCA  Patient's Home Medications on Admission:  Current Outpatient Medications:  .  Alirocumab (PRALUENT) 150 MG/ML SOAJ, Inject 1 pen into the skin every 14 (fourteen) days., Disp: 2 mL, Rfl: 11 .  aspirin 81 MG tablet, Take 81 mg by mouth daily., Disp: , Rfl:  .  Bempedoic Acid (NEXLETOL) 180 MG TABS, Take 1 tablet by mouth daily., Disp: 90 tablet, Rfl: 3 .  clopidogrel (PLAVIX) 75 MG tablet, Take 1 tablet (75 mg total) by mouth daily., Disp: 90 tablet, Rfl: 2 .  hydrochlorothiazide (HYDRODIURIL) 25 MG tablet, Take 1 tablet (25 mg total) by mouth daily., Disp: 30 tablet, Rfl: 11 .  isosorbide mononitrate (IMDUR) 30 MG 24 hr tablet, Take 0.5 tablets (15 mg total) by mouth daily., Disp: 15 tablet, Rfl: 3 .  lisinopril (ZESTRIL) 20 MG tablet, Take 1 tablet (20 mg total) by mouth daily., Disp: 90 tablet, Rfl: 2 .  metoprolol tartrate (LOPRESSOR) 50 MG tablet, Take 1 tablet (50 mg total) by mouth 2 (two) times daily with a meal., Disp: 180 tablet, Rfl: 3 .  nitroGLYCERIN (NITROSTAT) 0.4 MG SL tablet, Place 1 tablet (0.4 mg total) under the tongue every 5 (five) minutes as needed for chest pain., Disp: 30 tablet, Rfl: 6 .  tamsulosin (FLOMAX) 0.4 MG CAPS capsule, TAKE 1 CAPSULE(0.4 MG) BY MOUTH DAILY AFTER BREAKFAST, Disp: 30 capsule, Rfl: 1  Past Medical History: Past Medical History:  Diagnosis Date  . CAD (coronary artery disease)    a.  NSTEMI (1/16):  LHC - dLM 95, oLAD 90, mLAD 30, oCFX 80-90, pCFX 50, oPDA 80,  oPLA 75, EF 55-65% >> urgent CABG// Myoview 03/2020: EF 54, no ischemia or infarction; low risk  . Hepatic steatosis   . HLD (hyperlipidemia)   . Hx of echocardiogram    Echo (2/16):  Mild LVH, EF 55-60%, Gr 2 DD, trivial AI/TR, no effusion  . Hypertension 10/2012  . Muscle weakness 1997   prior seizure like activity, but none since; prior neurology eval, prior normal EEG 1997  . Obesity    s/p Lap Band surgery  . Vision problem    prior use of glasses, not currently    Tobacco Use: Social History   Tobacco Use  Smoking Status Never Smoker  Smokeless Tobacco Never Used    Labs: Recent Review Flowsheet Data    Labs for ITP Cardiac and Pulmonary Rehab Latest Ref Rng & Units 04/07/2019 07/12/2019 03/27/2020 08/29/2020 12/07/2020   Cholestrol 100 - 199 mg/dL 573(U) 202 542 706 237   LDLCALC 0 - 99 mg/dL 628(B) 64 77 95 90   HDL >39 mg/dL 15(V) 76(H) 60(V) 37(T) 33(L)   Trlycerides 0 - 149 mg/dL 062(I) 948(N) 462(V) 035(K) 175(H)   Hemoglobin A1c <5.7 % of total Hgb - - - - -   PHART 7.350 - 7.450 - - - - -   PCO2ART 35.0 - 45.0 mmHg - - - - -   HCO3 20.0 - 24.0 mEq/L - - - - -  TCO2 0 - 100 mmol/L - - - - -   ACIDBASEDEF 0.0 - 2.0 mmol/L - - - - -   O2SAT % - - - - -      Capillary Blood Glucose: Lab Results  Component Value Date   GLUCAP 111 (H) 12/21/2014   GLUCAP 122 (H) 12/21/2014   GLUCAP 152 (H) 12/21/2014   GLUCAP 116 (H) 12/20/2014   GLUCAP 129 (H) 12/20/2014     Exercise Target Goals: Exercise Program Goal: Individual exercise prescription set using results from initial 6 min walk test and THRR while considering  patient's activity barriers and safety.   Exercise Prescription Goal: Starting with aerobic activity 30 plus minutes a day, 3 days per week for initial exercise prescription. Provide home exercise prescription and guidelines that participant acknowledges understanding prior to discharge.  Activity Barriers & Risk Stratification:  Activity Barriers &  Cardiac Risk Stratification - 04/25/21 0857      Activity Barriers & Cardiac Risk Stratification   Activity Barriers Other (comment)    Comments H/O Plantar Fasciitis, Dizziness with position change    Cardiac Risk Stratification High           6 Minute Walk:  6 Minute Walk    Row Name 04/25/21 0825         6 Minute Walk   Phase Initial     Distance 1756 feet     Walk Time 6 minutes     # of Rest Breaks 0     MPH 3.33     METS 4.12     RPE 7     Perceived Dyspnea  0     VO2 Peak 14.4     Symptoms No     Resting HR 62 bpm     Resting BP 122/70     Resting Oxygen Saturation  95 %     Exercise Oxygen Saturation  during 6 min walk 96 %     Max Ex. HR 77 bpm     Max Ex. BP 120/70     2 Minute Post BP 110/68            Oxygen Initial Assessment:   Oxygen Re-Evaluation:   Oxygen Discharge (Final Oxygen Re-Evaluation):   Initial Exercise Prescription:  Initial Exercise Prescription - 04/25/21 0900      Date of Initial Exercise RX and Referring Provider   Date 04/25/21    Referring Provider Tonny Bollman, MD    Expected Discharge Date 06/21/21      Bike   Level 2.5    Minutes 15    METs 3      NuStep   Level 3    SPM 85    Minutes 15    METs 2.5      Prescription Details   Frequency (times per week) 3    Duration Progress to 30 minutes of continuous aerobic without signs/symptoms of physical distress      Intensity   THRR 40-80% of Max Heartrate 68-137    Ratings of Perceived Exertion 11-13    Perceived Dyspnea 0-4      Progression   Progression Continue progressive overload as per policy without signs/symptoms or physical distress.      Resistance Training   Training Prescription Yes    Weight 5 lbs    Reps 10-15           Perform Capillary Blood Glucose checks as needed.  Exercise Prescription Changes:   Exercise  Comments:   Exercise Goals and Review:  Exercise Goals    Row Name 04/25/21 0856             Exercise Goals    Increase Physical Activity Yes       Intervention Provide advice, education, support and counseling about physical activity/exercise needs.;Develop an individualized exercise prescription for aerobic and resistive training based on initial evaluation findings, risk stratification, comorbidities and participant's personal goals.       Expected Outcomes Short Term: Attend rehab on a regular basis to increase amount of physical activity.;Long Term: Add in home exercise to make exercise part of routine and to increase amount of physical activity.;Long Term: Exercising regularly at least 3-5 days a week.       Increase Strength and Stamina Yes       Intervention Provide advice, education, support and counseling about physical activity/exercise needs.;Develop an individualized exercise prescription for aerobic and resistive training based on initial evaluation findings, risk stratification, comorbidities and participant's personal goals.       Expected Outcomes Short Term: Increase workloads from initial exercise prescription for resistance, speed, and METs.;Short Term: Perform resistance training exercises routinely during rehab and add in resistance training at home;Long Term: Improve cardiorespiratory fitness, muscular endurance and strength as measured by increased METs and functional capacity ( )       Able to understand and use rate of perceived exertion (RPE) scale Yes       Intervention Provide education and explanation on how to use RPE scale       Expected Outcomes Short Term: Able to use RPE daily in rehab to express subjective intensity level;Long Term:  Able to use RPE to guide intensity level when exercising independently       Knowledge and understanding of Target Heart Rate Range (THRR) Yes       Intervention Provide education and explanation of THRR including how the numbers were predicted and where they are located for reference       Expected Outcomes Short Term: Able to state/look up  THRR;Short Term: Able to use daily as guideline for intensity in rehab;Long Term: Able to use THRR to govern intensity when exercising independently       Understanding of Exercise Prescription Yes       Intervention Provide education, explanation, and written materials on patient's individual exercise prescription       Expected Outcomes Short Term: Able to explain program exercise prescription;Long Term: Able to explain home exercise prescription to exercise independently              Exercise Goals Re-Evaluation :    Discharge Exercise Prescription (Final Exercise Prescription Changes):   Nutrition:  Target Goals: Understanding of nutrition guidelines, daily intake of sodium 1500mg , cholesterol 200mg , calories 30% from fat and 7% or less from saturated fats, daily to have 5 or more servings of fruits and vegetables.  Biometrics:  Pre Biometrics - 04/25/21 0908      Pre Biometrics   % Body Fat --            Nutrition Therapy Plan and Nutrition Goals:   Nutrition Assessments:  MEDIFICTS Score Key:  ?70 Need to make dietary changes   40-70 Heart Healthy Diet  ? 40 Therapeutic Level Cholesterol Diet   Picture Your Plate Scores:  <16 Unhealthy dietary pattern with much room for improvement.  41-50 Dietary pattern unlikely to meet recommendations for good health and room for improvement.  51-60 More healthful dietary  pattern, with some room for improvement.   >60 Healthy dietary pattern, although there may be some specific behaviors that could be improved.    Nutrition Goals Re-Evaluation:   Nutrition Goals Discharge (Final Nutrition Goals Re-Evaluation):   Psychosocial: Target Goals: Acknowledge presence or absence of significant depression and/or stress, maximize coping skills, provide positive support system. Participant is able to verbalize types and ability to use techniques and skills needed for reducing stress and depression.  Initial Review &  Psychosocial Screening:  Initial Psych Review & Screening - 04/25/21 0819      Initial Review   Current issues with Current Stress Concerns    Source of Stress Concerns Family;Chronic Illness      Family Dynamics   Good Support System? Yes   Nida Boatman is seperated he has his wife and twin brother for support     Barriers   Psychosocial barriers to participate in program There are no identifiable barriers or psychosocial needs.      Screening Interventions   Interventions Encouraged to exercise;Provide feedback about the scores to participant;To provide support and resources with identified psychosocial needs    Expected Outcomes Long Term Goal: Stressors or current issues are controlled or eliminated.           Quality of Life Scores:  Quality of Life - 04/25/21 0856      Quality of Life   Select Quality of Life      Quality of Life Scores   Health/Function Pre 20.23 %    Socioeconomic Pre 21.81 %    Psych/Spiritual Pre 18.43 %    Family Pre 14.1 %    GLOBAL Pre 19.36 %          Scores of 19 and below usually indicate a poorer quality of life in these areas.  A difference of  2-3 points is a clinically meaningful difference.  A difference of 2-3 points in the total score of the Quality of Life Index has been associated with significant improvement in overall quality of life, self-image, physical symptoms, and general health in studies assessing change in quality of life.  PHQ-9: Recent Review Flowsheet Data    Depression screen [facility] 2/9 04/25/2021 09/09/2017   Decreased Interest 0 0   Down, Depressed, Hopeless 0 0   PHQ - 2 Score 0 0     Interpretation of Total Score  Total Score Depression Severity:  1-4 = Minimal depression, 5-9 = Mild depression, 10-14 = Moderate depression, 15-19 = Moderately severe depression, 20-27 = Severe depression   Psychosocial Evaluation and Intervention:   Psychosocial Re-Evaluation:   Psychosocial Discharge (Final Psychosocial  Re-Evaluation):   Vocational Rehabilitation: Provide vocational rehab assistance to qualifying candidates.   Vocational Rehab Evaluation & Intervention:  Vocational Rehab - 04/25/21 0831      Initial Vocational Rehab Evaluation & Intervention   Assessment shows need for Vocational Rehabilitation No   Nida Boatman is retired and does not need vocational rehab at this time          Education: Education Goals: Education classes will be provided on a weekly basis, covering required topics. Participant will state understanding/return demonstration of topics presented.  Learning Barriers/Preferences:  Learning Barriers/Preferences - 04/25/21 0853      Learning Barriers/Preferences   Learning Barriers None    Learning Preferences Video;Pictoral;Computer/Internet           Education Topics: Hypertension, Hypertension Reduction -Define heart disease and high blood pressure. Discus how high blood pressure affects the body  and ways to reduce high blood pressure.   Exercise and Your Heart -Discuss why it is important to exercise, the FITT principles of exercise, normal and abnormal responses to exercise, and how to exercise safely.   Angina -Discuss definition of angina, causes of angina, treatment of angina, and how to decrease risk of having angina.   Cardiac Medications -Review what the following cardiac medications are used for, how they affect the body, and side effects that may occur when taking the medications.  Medications include Aspirin, Beta blockers, calcium channel blockers, ACE Inhibitors, angiotensin receptor blockers, diuretics, digoxin, and antihyperlipidemics.   Congestive Heart Failure -Discuss the definition of CHF, how to live with CHF, the signs and symptoms of CHF, and how keep track of weight and sodium intake.   Heart Disease and Intimacy -Discus the effect sexual activity has on the heart, how changes occur during intimacy as we age, and safety during sexual  activity.   Smoking Cessation / COPD -Discuss different methods to quit smoking, the health benefits of quitting smoking, and the definition of COPD.   Nutrition I: Fats -Discuss the types of cholesterol, what cholesterol does to the heart, and how cholesterol levels can be controlled.   Nutrition II: Labels -Discuss the different components of food labels and how to read food label   Heart Parts/Heart Disease and PAD -Discuss the anatomy of the heart, the pathway of blood circulation through the heart, and these are affected by heart disease.   Stress I: Signs and Symptoms -Discuss the causes of stress, how stress may lead to anxiety and depression, and ways to limit stress.   Stress II: Relaxation -Discuss different types of relaxation techniques to limit stress.   Warning Signs of Stroke / TIA -Discuss definition of a stroke, what the signs and symptoms are of a stroke, and how to identify when someone is having stroke.   Knowledge Questionnaire Score:  Knowledge Questionnaire Score - 04/25/21 0849      Knowledge Questionnaire Score   Pre Score 22/24           Core Components/Risk Factors/Patient Goals at Admission:  Personal Goals and Risk Factors at Admission - 04/25/21 0853      Core Components/Risk Factors/Patient Goals on Admission    Weight Management Yes;Obesity;Weight Loss    Intervention Weight Management: Develop a combined nutrition and exercise program designed to reach desired caloric intake, while maintaining appropriate intake of nutrient and fiber, sodium and fats, and appropriate energy expenditure required for the weight goal.;Weight Management: Provide education and appropriate resources to help participant work on and attain dietary goals.;Weight Management/Obesity: Establish reasonable short term and long term weight goals.;Obesity: Provide education and appropriate resources to help participant work on and attain dietary goals.    Admit Weight  238 lb 5.1 oz (108.1 kg)    Expected Outcomes Short Term: Continue to assess and modify interventions until short term weight is achieved;Long Term: Adherence to nutrition and physical activity/exercise program aimed toward attainment of established weight goal;Weight Maintenance: Understanding of the daily nutrition guidelines, which includes 25-35% calories from fat, 7% or less cal from saturated fats, less than 200mg  cholesterol, less than 1.5gm of sodium, & 5 or more servings of fruits and vegetables daily;Weight Loss: Understanding of general recommendations for a balanced deficit meal plan, which promotes 1-2 lb weight loss per week and includes a negative energy balance of [phone] kcal/d;Understanding recommendations for meals to include 15-35% energy as protein, 25-35% energy from fat, 35-60% energy  from carbohydrates, less than 200mg  of dietary cholesterol, 20-35 gm of total fiber daily;Understanding of distribution of calorie intake throughout the day with the consumption of 4-5 meals/snacks    Hypertension Yes    Intervention Provide education on lifestyle modifcations including regular physical activity/exercise, weight management, moderate sodium restriction and increased consumption of fresh fruit, vegetables, and low fat dairy, alcohol moderation, and smoking cessation.;Monitor prescription use compliance.    Expected Outcomes Short Term: Continued assessment and intervention until BP is < 140/50mm HG in hypertensive participants. < 130/9mm HG in hypertensive participants with diabetes, heart failure or chronic kidney disease.;Long Term: Maintenance of blood pressure at goal levels.    Lipids Yes    Intervention Provide education and support for participant on nutrition & aerobic/resistive exercise along with prescribed medications to achieve LDL 70mg , HDL >40mg .    Expected Outcomes Short Term: Participant states understanding of desired cholesterol values and is compliant with medications  prescribed. Participant is following exercise prescription and nutrition guidelines.;Long Term: Cholesterol controlled with medications as prescribed, with individualized exercise RX and with personalized nutrition plan. Value goals: LDL < 70mg , HDL > 40 mg.    Stress Yes    Intervention Offer individual and/or small group education and counseling on adjustment to heart disease, stress management and health-related lifestyle change. Teach and support self-help strategies.;Refer participants experiencing significant psychosocial distress to appropriate mental health specialists for further evaluation and treatment. When possible, include family members and significant others in education/counseling sessions.    Expected Outcomes Short Term: Participant demonstrates changes in health-related behavior, relaxation and other stress management skills, ability to obtain effective social support, and compliance with psychotropic medications if prescribed.;Long Term: Emotional wellbeing is indicated by absence of clinically significant psychosocial distress or social isolation.           Core Components/Risk Factors/Patient Goals Review:    Core Components/Risk Factors/Patient Goals at Discharge (Final Review):    ITP Comments:  ITP Comments    Row Name 04/25/21 0818           ITP Comments Dr MD, Medical Director              Comments:Brad  attended orientation on 04/25/2021 to review rules and guidelines for program.  Completed 6 minute walk test, Intitial ITP, and exercise prescription.  VSS. Telemetry-Sinus rhythm.  Asymptomatic. Safety measures and social distancing in place per CDC guidelines. Orthostatic BP's checked as Mr Hurrell reports feeling lightheaded at times when changing positions. Sitting BP 122/70. Standing BP 132/70. No signs or symptoms.06/25/2021, RN,BSN 04/25/2021 1:39 PM

## 2021-04-29 ENCOUNTER — Encounter (HOSPITAL_COMMUNITY)
Admission: RE | Admit: 2021-04-29 | Discharge: 2021-04-29 | Disposition: A | Discharge status: Home / Self Care | Payer: Medicaid Other | Source: Ambulatory Visit | Attending: Cardiovascular Disease | Admitting: Cardiovascular Disease

## 2021-04-29 ENCOUNTER — Other Ambulatory Visit: Payer: Self-pay

## 2021-04-29 DIAGNOSIS — Z955 Presence of coronary angioplasty implant and graft: Secondary | ICD-10-CM

## 2021-04-29 NOTE — Progress Notes (Signed)
Daily Session Note  Patient Details  Name: Hunter Gomez MRN: [id number] Date of Birth: [birth year] Referring Provider:   Flowsheet Row CARDIAC REHAB PHASE II ORIENTATION from 04/25/2021 in [place]  Referring Provider Sherren Mocha, MD      Encounter Date: 04/29/2021  Check In:  Session Check In - 04/29/21 0857      Check-In   Supervising physician immediately available to respond to emergencies [facility] immediately available    Physician(s) Dr Tana Coast    Location [facility]    Staff Present Barnet Pall, RN, Milus Glazier, MS, ACSM-CEP, CCRP, Exercise Physiologist;Jetta Walker BS, ACSM EP-C, Exercise Physiologist;Other    Virtual Visit No    Medication changes reported     No    Fall or balance concerns reported    No    Tobacco Cessation No Change    Current number of cigarettes/nicotine per day     0    Warm-up and Cool-down Performed on first and last piece of equipment   Cardiac Rehab Orientation   Resistance Training Performed No    VAD Patient? No    PAD/SET Patient? No      Pain Assessment   Currently in Pain? No/denies    Pain Score 0-No pain    Multiple Pain Sites No           Capillary Blood Glucose: No results found for this or any previous visit (from the past 24 hour(s)).   Exercise Prescription Changes - 04/29/21 1000      Response to Exercise   Blood Pressure (Admit) 118/68    Blood Pressure (Exercise) 140/70    Blood Pressure (Exit) 118/70    Heart Rate (Admit) 66 bpm    Heart Rate (Exercise) 79 bpm    Heart Rate (Exit) 66 bpm    Rating of Perceived Exertion (Exercise) 9    Symptoms None    Comments Pt's first day in the [facility]2 program    Duration Continue with 30 min of aerobic exercise without signs/symptoms of physical distress.    Intensity THRR unchanged      Progression   Progression Continue to progress workloads to maintain intensity without signs/symptoms of physical distress.     Average METs 3.35      Resistance Training   Training Prescription Yes    Weight 5 lbs    Reps 10-15    Time 10 Minutes      Interval Training   Interval Training No      Bike   Level 2.5    Minutes 15    METs 4      NuStep   Level 3    SPM 85    Minutes 15    METs 2.7           Social History   Tobacco Use  Smoking Status Never Smoker  Smokeless Tobacco Never Used    Goals Met:  Exercise tolerated well No report of cardiac concerns or symptoms Strength training completed today  Goals Unmet:  Not Applicable  Comments: Hunter Gomez started cardiac rehab today.  Pt tolerated light exercise without difficulty. VSS, telemetry-Sinus Rhythm, asymptomatic.  Medication list reconciled. Pt denies barriers to medicaiton compliance.  PSYCHOSOCIAL ASSESSMENT:  PHQ-0. Pt exhibits positive coping skills, hopeful outlook with supportive family. No psychosocial needs identified at this time, no psychosocial interventions necessary.    Pt enjoys fishing ,softball and sports .   Pt oriented to exercise equipment  and routine.    Understanding verbalized.Barnet Pall, RN,BSN 04/29/2021 10:41 AM   Dr. Fransico Him is Medical Director for Cardiac Rehab at [facility].

## 2021-05-01 ENCOUNTER — Encounter (HOSPITAL_COMMUNITY)
Admission: RE | Admit: 2021-05-01 | Discharge: 2021-05-01 | Disposition: A | Discharge status: Home / Self Care | Payer: Medicaid Other | Source: Ambulatory Visit | Attending: Cardiovascular Disease | Admitting: Cardiovascular Disease

## 2021-05-01 ENCOUNTER — Other Ambulatory Visit: Payer: Self-pay

## 2021-05-01 DIAGNOSIS — Z955 Presence of coronary angioplasty implant and graft: Secondary | ICD-10-CM

## 2021-05-01 NOTE — Progress Notes (Addendum)
Hunter Gomez 50 y.o. male Nutrition Note  Diagnosis:DES RCA  Past Medical History:  Diagnosis Date  . CAD (coronary artery disease)    a.  NSTEMI (1/16):  LHC - dLM 95, oLAD 90, mLAD 30, oCFX 80-90, pCFX 50, oPDA 80, oPLA 75, EF 55-65% >> urgent CABG// Myoview 03/2020: EF 54, no ischemia or infarction; low risk  . Hepatic steatosis   . HLD (hyperlipidemia)   . Hx of echocardiogram    Echo (2/16):  Mild LVH, EF 55-60%, Gr 2 DD, trivial AI/TR, no effusion  . Hypertension 10/2012  . Muscle weakness 1997   prior seizure like activity, but none since; prior neurology eval, prior normal EEG 1997  . Obesity    s/p Lap Band surgery  . Vision problem    prior use of glasses, not currently     Medications reviewed.   Current Outpatient Medications:  .  Alirocumab (PRALUENT) 150 MG/ML SOAJ, Inject 1 pen into the skin every 14 (fourteen) days., Disp: 2 mL, Rfl: 11 .  aspirin 81 MG tablet, Take 81 mg by mouth daily., Disp: , Rfl:  .  Bempedoic Acid (NEXLETOL) 180 MG TABS, Take 1 tablet by mouth daily., Disp: 90 tablet, Rfl: 3 .  clopidogrel (PLAVIX) 75 MG tablet, Take 1 tablet (75 mg total) by mouth daily., Disp: 90 tablet, Rfl: 2 .  hydrochlorothiazide (HYDRODIURIL) 25 MG tablet, Take 1 tablet (25 mg total) by mouth daily., Disp: 30 tablet, Rfl: 11 .  isosorbide mononitrate (IMDUR) 30 MG 24 hr tablet, Take 0.5 tablets (15 mg total) by mouth daily., Disp: 15 tablet, Rfl: 3 .  lisinopril (ZESTRIL) 20 MG tablet, Take 1 tablet (20 mg total) by mouth daily., Disp: 90 tablet, Rfl: 2 .  metoprolol tartrate (LOPRESSOR) 50 MG tablet, Take 1 tablet (50 mg total) by mouth 2 (two) times daily with a meal., Disp: 180 tablet, Rfl: 3 .  nitroGLYCERIN (NITROSTAT) 0.4 MG SL tablet, Place 1 tablet (0.4 mg total) under the tongue every 5 (five) minutes as needed for chest pain., Disp: 30 tablet, Rfl: 6 .  tamsulosin (FLOMAX) 0.4 MG CAPS capsule, TAKE 1 CAPSULE(0.4 MG) BY MOUTH DAILY AFTER BREAKFAST, Disp: 30  capsule, Rfl: 1   Ht Readings from Last 1 Encounters:  04/25/21 5\' 9"  (1.753 m)     Wt Readings from Last 3 Encounters:  04/25/21 238 lb 5.1 oz (108.1 kg)  02/27/21 237 lb 12.8 oz (107.9 kg)  02/13/21 240 lb (108.9 kg)     There is no height or weight on file to calculate BMI.   Social History   Tobacco Use  Smoking Status Never Smoker  Smokeless Tobacco Never Used     Lab Results  Component Value Date   CHOL 153 12/07/2020   Lab Results  Component Value Date   HDL 33 (L) 12/07/2020   Lab Results  Component Value Date   LDLCALC 90 12/07/2020   Lab Results  Component Value Date   TRIG 175 (H) 12/07/2020     Lab Results  Component Value Date   HGBA1C 5.4 09/09/2017     CBG (last 3)  No results for input(s): GLUCAP in the last 72 hours.   Nutrition Note  Spoke with pt. Nutrition Plan and Nutrition Survey goals reviewed with pt. Pt is following a Heart Healthy diet.  Pt wants to lose wt. Pt has been trying to lose wt by following a low carb diet. He states history of not being able to lose  weight without exercise. He plans to incorporate more as he is allowed. Wt loss tips reviewed (label reading, how to build a healthy plate, portion sizes, eating frequently across the day).   Pt has not had recent A1C due to not seeing PCP for past several years. We reviewed glucose levels 110-116 mg/dl from recent BMPs. He plans to ask about A1C testing at next appointment.  Sodium intake - Per discussion, pt does not use canned/convenience foods often. Pt does not add salt to food. Pt does not eat out frequently. He reads labels and has been trying to choose low sodium foods at the grocery store.   He had a lap-band in early 2000's. He says he still does not eat large meals but has been able to incorporate more fiber rich foods (fruits/veggies).   Noted elevated LDL and low HDL and elevated TG. Discussed appropriate diet changes.   Overall diet pattern was heart healthy.  However he has been greatly reducing calories recently for weight loss efforts.  Diet recall; Breakfast: cereal with skim milk Lunch: salad with grilled chicken Dinner: cereal with skim milk  Snack: sugar free jello, cottage cheese.  Per estimated needs, pt is not meeting calorie goals or fiber goals.  Pt is motivated to make changes. Weight loss is high priority for pt.   Pt expressed understanding of the information reviewed.   Nutrition Diagnosis ? Obese  II = 35-39.9 related to excessive energy intake and weight history of weight cycling as evidenced by a pt report, weight history, diet history of low carb to "normal diet"   Nutrition Intervention ? Pt's individual nutrition plan reviewed with pt. ? Estimated nutrient needs: 2000-2250, 30 g fiber, 2 g sodium intake  ? Continue client-centered nutrition education by RD, as part of interdisciplinary care.  Goal(s) ? Pt to identify food quantities necessary to achieve weight loss of 6-24 lb at graduation from cardiac rehab.  ? Pt to build a healthy plate including vegetables, fruits, whole grains, and low-fat dairy products in a heart healthy meal plan. ? Pt to incorporate high fiber foods like beans to meet fiber needs of 30 g/day  Plan:   Pt to attend nutrition classes ? Nutrition I ? Nutrition II ? Portion Distortion   Will provide client-centered nutrition education as part of interdisciplinary care  Monitor and evaluate progress toward nutrition goal with team.   Andrey Campanile, MS, RDN, LDN

## 2021-05-01 NOTE — Progress Notes (Signed)
QUALITY OF LIFE SCORE REVIEW  Hunter Gomez completed Quality of Life survey as a participant in Cardiac Rehab. Scores 21.0 or below are considered low. Pt score very low in several areas Overall 19.36, Health and Function 20.23, socioeconomic 2.81, physiological and spiritual 18.43, family 14.10. Patient quality of life slightly altered by physical constraints which limits ability to perform as prior to recent cardiac illness.Hunter Gomez is going through a separation with his wife and admits having some family stress as well as be dissatisfied with his health due to his recent coronary event.  Offered emotional support and reassurance.  Will continue to monitor and intervene as necessary. Patient admits to not seeing his primary care provider in a few years. I encouraged Hunter Gomez to follow up for his yearly wellness exam for continued health maintenance. Patient denies being depressed. Will forward quality of life to [facility].  Patient said he does not see the need for seeing his primary care provider.  Gladstone Lighter, RN,BSN 05/01/2021 2:10 PM

## 2021-05-03 ENCOUNTER — Other Ambulatory Visit (HOSPITAL_COMMUNITY): Payer: Self-pay

## 2021-05-03 ENCOUNTER — Other Ambulatory Visit: Payer: Self-pay

## 2021-05-03 ENCOUNTER — Encounter (HOSPITAL_COMMUNITY)
Admission: RE | Admit: 2021-05-03 | Discharge: 2021-05-03 | Disposition: A | Discharge status: Home / Self Care | Payer: Medicaid Other | Source: Ambulatory Visit | Attending: Cardiovascular Disease | Admitting: Cardiovascular Disease

## 2021-05-03 DIAGNOSIS — Z955 Presence of coronary angioplasty implant and graft: Secondary | ICD-10-CM

## 2021-05-03 DIAGNOSIS — R739 Hyperglycemia, unspecified: Secondary | ICD-10-CM

## 2021-05-03 NOTE — Telephone Encounter (Signed)
Please add A1c to labs. I placed the order. [name], PA-C    05/03/2021 1:14 PM

## 2021-05-03 NOTE — Progress Notes (Signed)
Reviewed home exercise Rx with patient today. Pt plans to exercise at the gym using the elliptical stepper 2-3 x/week for 30-45 minutes. Encouraged warm-up, cool-down, and stretching. Reviewed THRR of 68-137 and keeping RPE between 11-13. Fluids encouraged with exercise. Discussed weather parameters for temperature and humidity for safe exercise outdoors. Reviewed S/S to terminate exercise and when to call 911 vs MD. Reviewed use of NTG and to carry at all time. Pt verbalized understanding of the home exercise Rx and was provided a copy.   Lorin Picket MS, ACSM-CEP, CCRP

## 2021-05-06 ENCOUNTER — Other Ambulatory Visit: Payer: Self-pay | Admitting: *Deleted

## 2021-05-06 ENCOUNTER — Encounter (HOSPITAL_COMMUNITY)
Admission: RE | Admit: 2021-05-06 | Discharge: 2021-05-06 | Disposition: A | Discharge status: Home / Self Care | Payer: Medicaid Other | Source: Ambulatory Visit | Attending: Cardiovascular Disease | Admitting: Cardiovascular Disease

## 2021-05-06 ENCOUNTER — Other Ambulatory Visit: Payer: Self-pay

## 2021-05-06 DIAGNOSIS — Z955 Presence of coronary angioplasty implant and graft: Secondary | ICD-10-CM | POA: Diagnosis not present

## 2021-05-06 DIAGNOSIS — R739 Hyperglycemia, unspecified: Secondary | ICD-10-CM

## 2021-05-08 ENCOUNTER — Other Ambulatory Visit: Payer: Self-pay

## 2021-05-08 ENCOUNTER — Encounter (HOSPITAL_COMMUNITY)
Admission: RE | Admit: 2021-05-08 | Discharge: 2021-05-08 | Disposition: A | Discharge status: Home / Self Care | Payer: Medicaid Other | Source: Ambulatory Visit | Attending: Cardiovascular Disease | Admitting: Cardiovascular Disease

## 2021-05-08 ENCOUNTER — Other Ambulatory Visit: Payer: Medicaid Other | Admitting: *Deleted

## 2021-05-08 DIAGNOSIS — E782 Mixed hyperlipidemia: Secondary | ICD-10-CM

## 2021-05-08 DIAGNOSIS — R739 Hyperglycemia, unspecified: Secondary | ICD-10-CM

## 2021-05-08 DIAGNOSIS — Z955 Presence of coronary angioplasty implant and graft: Secondary | ICD-10-CM | POA: Diagnosis not present

## 2021-05-08 DIAGNOSIS — I25119 Atherosclerotic heart disease of native coronary artery with unspecified angina pectoris: Secondary | ICD-10-CM

## 2021-05-08 LAB — HEPATIC FUNCTION PANEL
ALT: 40 IU/L (ref 0–44)
AST: 29 IU/L (ref 0–40)
Albumin: 4.9 g/dL (ref 4.0–5.0)
Alkaline Phosphatase: 61 IU/L (ref 44–121)
Bilirubin Total: 0.5 mg/dL (ref 0.0–1.2)
Bilirubin, Direct: 0.17 mg/dL (ref 0.00–0.40)
Total Protein: 7 g/dL (ref 6.0–8.5)

## 2021-05-08 LAB — LIPID PANEL
Chol/HDL Ratio: 3.7 ratio (ref 0.0–5.0)
Cholesterol, Total: 107 mg/dL (ref 100–199)
HDL: 29 mg/dL — ABNORMAL LOW (ref >39)
LDL Chol Calc (NIH): 37 mg/dL (ref 0–99)
Triglycerides: 271 mg/dL — ABNORMAL HIGH (ref 0–149)
VLDL Cholesterol Cal: 41 mg/dL — ABNORMAL HIGH (ref 5–40)

## 2021-05-08 LAB — HEMOGLOBIN A1C
Est. average glucose Bld gHb Est-mCnc: 111 mg/dL
Hgb A1c MFr Bld: 5.5 % (ref 4.8–5.6)

## 2021-05-08 NOTE — Telephone Encounter (Signed)
Can we fix his Rx with his pharmacy to fill 90 days at a time with 1 year of refills? Thanks! [address] [place], [place]    05/08/2021 5:49 PM

## 2021-05-10 ENCOUNTER — Encounter (HOSPITAL_COMMUNITY)
Admission: RE | Admit: 2021-05-10 | Discharge: 2021-05-10 | Disposition: A | Discharge status: Home / Self Care | Payer: Medicaid Other | Source: Ambulatory Visit | Attending: Cardiovascular Disease | Admitting: Cardiovascular Disease

## 2021-05-10 ENCOUNTER — Other Ambulatory Visit: Payer: Self-pay

## 2021-05-10 DIAGNOSIS — Z955 Presence of coronary angioplasty implant and graft: Secondary | ICD-10-CM | POA: Diagnosis not present

## 2021-05-13 ENCOUNTER — Encounter (HOSPITAL_COMMUNITY)
Admission: RE | Admit: 2021-05-13 | Discharge: 2021-05-13 | Disposition: A | Discharge status: Home / Self Care | Payer: Medicaid Other | Source: Ambulatory Visit | Attending: Cardiovascular Disease | Admitting: Cardiovascular Disease

## 2021-05-13 ENCOUNTER — Other Ambulatory Visit: Payer: Self-pay

## 2021-05-13 DIAGNOSIS — Z955 Presence of coronary angioplasty implant and graft: Secondary | ICD-10-CM | POA: Diagnosis not present

## 2021-05-15 ENCOUNTER — Other Ambulatory Visit: Payer: Self-pay

## 2021-05-15 ENCOUNTER — Encounter (HOSPITAL_COMMUNITY)
Admission: RE | Admit: 2021-05-15 | Discharge: 2021-05-15 | Disposition: A | Discharge status: Home / Self Care | Payer: Medicaid Other | Source: Ambulatory Visit | Attending: Cardiovascular Disease | Admitting: Cardiovascular Disease

## 2021-05-15 DIAGNOSIS — Z955 Presence of coronary angioplasty implant and graft: Secondary | ICD-10-CM

## 2021-05-16 NOTE — Progress Notes (Signed)
Cardiac Individual Treatment Plan  Patient Details  Name: Hunter Gomez MRN: [id number] Date of Birth: [birth year] Referring Provider:   Flowsheet Row CARDIAC REHAB PHASE II ORIENTATION from 04/25/2021 in [place]  Referring Provider Sherren Mocha, MD       Initial Encounter Date:  Flowsheet Row CARDIAC REHAB PHASE II ORIENTATION from 04/25/2021 in [place]  Date 04/25/21       Visit Diagnosis: 02/13/21 S/P DES RCA  Patient's Home Medications on Admission:  Current Outpatient Medications:    Alirocumab (PRALUENT) 150 MG/ML SOAJ, Inject 1 pen into the skin every 14 (fourteen) days., Disp: 2 mL, Rfl: 11   aspirin 81 MG tablet, Take 81 mg by mouth daily., Disp: , Rfl:    Bempedoic Acid (NEXLETOL) 180 MG TABS, Take 1 tablet by mouth daily., Disp: 90 tablet, Rfl: 3   clopidogrel (PLAVIX) 75 MG tablet, Take 1 tablet (75 mg total) by mouth daily., Disp: 90 tablet, Rfl: 2   hydrochlorothiazide (HYDRODIURIL) 25 MG tablet, Take 1 tablet (25 mg total) by mouth daily., Disp: 30 tablet, Rfl: 11   isosorbide mononitrate (IMDUR) 30 MG 24 hr tablet, Take 0.5 tablets (15 mg total) by mouth daily., Disp: 15 tablet, Rfl: 3   lisinopril (ZESTRIL) 20 MG tablet, Take 1 tablet (20 mg total) by mouth daily., Disp: 90 tablet, Rfl: 2   metoprolol tartrate (LOPRESSOR) 50 MG tablet, Take 1 tablet (50 mg total) by mouth 2 (two) times daily with a meal., Disp: 180 tablet, Rfl: 3   nitroGLYCERIN (NITROSTAT) 0.4 MG SL tablet, Place 1 tablet (0.4 mg total) under the tongue every 5 (five) minutes as needed for chest pain., Disp: 30 tablet, Rfl: 6   tamsulosin (FLOMAX) 0.4 MG CAPS capsule, TAKE 1 CAPSULE(0.4 MG) BY MOUTH DAILY AFTER BREAKFAST, Disp: 30 capsule, Rfl: 1  Past Medical History: Past Medical History:  Diagnosis Date   CAD (coronary artery disease)    a.  NSTEMI (1/16):  LHC - dLM 95, oLAD 90, mLAD 30, oCFX 80-90, pCFX 50, oPDA 80, oPLA 75,  EF 55-65% >> urgent CABG// Myoview 03/2020: EF 54, no ischemia or infarction; low risk   Hepatic steatosis    HLD (hyperlipidemia)    Hx of echocardiogram    Echo (2/16):  Mild LVH, EF 55-60%, Gr 2 DD, trivial AI/TR, no effusion   Hypertension 10/2012   Muscle weakness 1997   prior seizure like activity, but none since; prior neurology eval, prior normal EEG 1997   Obesity    s/p Lap Band surgery   Vision problem    prior use of glasses, not currently    Tobacco Use: Social History   Tobacco Use  Smoking Status Never  Smokeless Tobacco Never    Labs: Recent Review Flowsheet Data     Labs for ITP Cardiac and Pulmonary Rehab Latest Ref Rng & Units 07/12/2019 03/27/2020 08/29/2020 12/07/2020 05/08/2021   Cholestrol 100 - 199 mg/dL 140 139 170 153 107   LDLCALC 0 - 99 mg/dL 64 77 95 90 37   HDL >39 mg/dL 32(L) 34(L) 35(L) 33(L) 29(L)   Trlycerides 0 - 149 mg/dL 221(H) 163(H) 234(H) 175(H) 271(H)   Hemoglobin A1c 4.8 - 5.6 % - - - - 5.5   PHART 7.350 - 7.450 - - - - -   PCO2ART 35.0 - 45.0 mmHg - - - - -   HCO3 20.0 - 24.0 mEq/L - - - - -  TCO2 0 - 100 mmol/L - - - - -   ACIDBASEDEF 0.0 - 2.0 mmol/L - - - - -   O2SAT % - - - - -       Capillary Blood Glucose: Lab Results  Component Value Date   GLUCAP 111 (H) 12/21/2014   GLUCAP 122 (H) 12/21/2014   GLUCAP 152 (H) 12/21/2014   GLUCAP 116 (H) 12/20/2014   GLUCAP 129 (H) 12/20/2014     Exercise Target Goals: Exercise Program Goal: Individual exercise prescription set using results from initial 6 min walk test and THRR while considering  patient's activity barriers and safety.   Exercise Prescription Goal: Starting with aerobic activity 30 plus minutes a day, 3 days per week for initial exercise prescription. Provide home exercise prescription and guidelines that participant acknowledges understanding prior to discharge.  Activity Barriers & Risk Stratification:  Activity Barriers & Cardiac Risk Stratification - 04/25/21  0857       Activity Barriers & Cardiac Risk Stratification   Activity Barriers Other (comment)    Comments H/O Plantar Fasciitis, Dizziness with position change    Cardiac Risk Stratification High             6 Minute Walk:  6 Minute Walk     Row Name 04/25/21 0825         6 Minute Walk   Phase Initial     Distance 1756 feet     Walk Time 6 minutes     # of Rest Breaks 0     MPH 3.33     METS 4.12     RPE 7     Perceived Dyspnea  0     VO2 Peak 14.4     Symptoms No     Resting HR 62 bpm     Resting BP 122/70     Resting Oxygen Saturation  95 %     Exercise Oxygen Saturation  during 6 min walk 96 %     Max Ex. HR 77 bpm     Max Ex. BP 120/70     2 Minute Post BP 110/68              Oxygen Initial Assessment:   Oxygen Re-Evaluation:   Oxygen Discharge (Final Oxygen Re-Evaluation):   Initial Exercise Prescription:  Initial Exercise Prescription - 04/25/21 0900       Date of Initial Exercise RX and Referring Provider   Date 04/25/21    Referring Provider Sherren Mocha, MD    Expected Discharge Date 06/21/21      Bike   Level 2.5    Minutes 15    METs 3      NuStep   Level 3    SPM 85    Minutes 15    METs 2.5      Prescription Details   Frequency (times per week) 3    Duration Progress to 30 minutes of continuous aerobic without signs/symptoms of physical distress      Intensity   THRR 40-80% of Max Heartrate 68-137    Ratings of Perceived Exertion 11-13    Perceived Dyspnea 0-4      Progression   Progression Continue progressive overload as per policy without signs/symptoms or physical distress.      Resistance Training   Training Prescription Yes    Weight 5 lbs    Reps 10-15             Perform Capillary Blood Glucose  checks as needed.  Exercise Prescription Changes:   Exercise Prescription Changes     Row Name 04/29/21 1000 05/03/21 1000 05/13/21 0849 05/15/21 1015       Response to Exercise   Blood Pressure  (Admit) 118/68 124/80 112/60 116/72    Blood Pressure (Exercise) 140/70 160/80 124/70 124/78    Blood Pressure (Exit) 118/70 110/74 104/60 100/70    Heart Rate (Admit) 66 bpm 60 bpm 63 bpm 61 bpm    Heart Rate (Exercise) 79 bpm 91 bpm 97 bpm 104 bpm    Heart Rate (Exit) 66 bpm 60 bpm 60 bpm 63 bpm    Rating of Perceived Exertion (Exercise) 9 11 12.5 12    Symptoms None None None None    Comments Pt's first day in the CRP2 program Reviewed home exercise Rx Increased WL on bike and NuStep Reviewed MET' and Goals    Duration Continue with 30 min of aerobic exercise without signs/symptoms of physical distress. Continue with 30 min of aerobic exercise without signs/symptoms of physical distress. Continue with 30 min of aerobic exercise without signs/symptoms of physical distress. Continue with 30 min of aerobic exercise without signs/symptoms of physical distress.    Intensity THRR unchanged THRR unchanged THRR unchanged THRR unchanged         Progression        Progression Continue to progress workloads to maintain intensity without signs/symptoms of physical distress. Continue to progress workloads to maintain intensity without signs/symptoms of physical distress. Continue to progress workloads to maintain intensity without signs/symptoms of physical distress. Continue to progress workloads to maintain intensity without signs/symptoms of physical distress.    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 Add 2 additional days to program exercise sessions. Add 2 additional days to program exercise sessions.    Initial Home Exercises Provided -- 05/03/21 05/03/21 05/03/21            Exercise Comments:   Exercise Comments     Row Name 04/29/21 1018 05/03/21 1032 05/15/21 1030       Exercise Comments Pt's first day in the CRP2 program. Pt tolerated session well with no complaints. Pt is off to a good start. Reviewed home exercise Rx with patient. Pt will be exercising at a gym near  his home 2-3 x/week using the Elliptical stepper. Reviewed METs and goals with pt today. Pt tolerated exercise well with an average MET level of 4.4. Pt feels he is working well towards his goals. Pt states the CRP2 program is helping him gain strength. Pt also states he is having a hard time losing wt, but he has met with the RD and hopes with exercise and diet change he can acieve his goals.              Exercise Goals and Review:   Exercise Goals     Row Name 04/25/21 0856             Exercise Goals   Increase Physical Activity Yes       Intervention Provide advice, education, support and counseling about physical activity/exercise needs.;Develop an individualized exercise prescription for aerobic and resistive training based on initial evaluation findings, risk stratification, comorbidities and participant's personal goals.       Expected Outcomes Short Term: Attend rehab on a regular basis to increase amount of physical activity.;Long Term: Add in home exercise to make exercise part of routine and to increase amount of physical activity.;Long Term: Exercising regularly at least 3-5 days a week.       Increase Strength and Stamina Yes       Intervention Provide advice, education, support and counseling about  physical activity/exercise needs.;Develop an individualized exercise prescription for aerobic and resistive training based on initial evaluation findings, risk stratification, comorbidities and participant's personal goals.       Expected Outcomes Short Term: Increase workloads from initial exercise prescription for resistance, speed, and METs.;Short Term: Perform resistance training exercises routinely during rehab and add in resistance training at home;Long Term: Improve cardiorespiratory fitness, muscular endurance and strength as measured by increased METs and functional capacity (6MWT)       Able to understand and use rate of perceived exertion (RPE) scale Yes       Intervention Provide education and explanation on how to use RPE scale       Expected Outcomes Short Term: Able to use RPE daily in rehab to express subjective intensity level;Long Term:  Able to use RPE to guide intensity level when exercising independently       Knowledge and understanding of Target Heart Rate Range (THRR) Yes       Intervention Provide education and explanation of THRR including how the numbers were predicted and where they are located for reference       Expected Outcomes Short Term: Able to state/look up THRR;Short Term: Able to use daily as guideline for intensity in rehab;Long Term: Able to use THRR to govern intensity when exercising independently       Understanding of Exercise Prescription Yes       Intervention Provide education, explanation, and written materials on patient's individual exercise prescription       Expected Outcomes Short Term: Able to explain program exercise prescription;Long Term: Able to explain home exercise prescription to exercise independently                Exercise Goals Re-Evaluation :  Exercise Goals Re-Evaluation     Row Name 04/29/21 1017 05/03/21 1034 05/15/21 1214         Exercise Goal Re-Evaluation   Exercise Goals Review Increase Physical Activity;Increase  Strength and Stamina;Able to understand and use rate of perceived exertion (RPE) scale;Knowledge and understanding of Target Heart Rate Range (THRR);Understanding of Exercise Prescription Increase Physical Activity;Increase  Strength and Stamina;Able to understand and use rate of perceived exertion (RPE) scale;Knowledge and understanding of Target Heart Rate Range (THRR);Able to check pulse independently;Understanding of Exercise Prescription Increase Strength and Stamina;Increase Physical Activity;Able to understand and use rate of perceived exertion (RPE) scale;Knowledge and understanding of Target Heart Rate Range (THRR);Understanding of Exercise Prescription     Comments Pt's first day in the CRP2 program. Pt understands the exercise Rx, THRR, and RPE scale. Reviewed home exercise Rx with patient today. Pt will use the Elliptical stepper 2-3 x/week at a gym near his home. Reviewed METs and goals with pt today. Pt tolerated exercise well with an average MET level of 4.4. Pt feels he is working well towards his goals. Pt states the CRP2 program is helping him gain strength. Pt also states he is having a hard time losing wt, but he has met with the RD and homes with exercise and diet change he can acieve his goals.     Expected Outcomes Will continue to monitor and progress patients as tolerated. Pt will exercise 2-3 x/week agt the gym for 30-45 minutes. Will continue to monitor pt and progress workloads as tolerated without sign or symptom.               Discharge Exercise Prescription (Final Exercise Prescription Changes):  Exercise Prescription Changes - 05/15/21 1015       Response to Exercise   Blood Pressure (Admit) 116/72    Blood Pressure (Exercise) 124/78    Blood Pressure (Exit) 100/70    Heart Rate (Admit) 61 bpm    Heart Rate (Exercise) 104 bpm    Heart Rate (Exit) 63 bpm    Rating of Perceived Exertion (Exercise) 12    Symptoms None    Comments Reviewed MET' and Goals     Duration Continue with 30 min of aerobic exercise without signs/symptoms of physical distress.    Intensity THRR unchanged      Progression   Progression Continue to progress workloads to maintain intensity without signs/symptoms of physical distress.    Average METs 3.65      Resistance Training   Training Prescription No   No wts wednesday   Weight 5 lbs    Reps 10-15    Time 10 Minutes      Interval Training   Interval Training No      Bike   Level 3.5    Minutes 15    METs 5      NuStep   Level 4    SPM 85    Minutes 15    METs 3.8      Home Exercise Plan   Plans to continue exercise at [employer] (comment)    Frequency Add 2 additional days to program exercise sessions.    Initial Home Exercises Provided 05/03/21             Nutrition:  Target Goals: Understanding of nutrition guidelines, daily intake of sodium <1536m, cholesterol <2089m calories 30% from fat and 7% or less from saturated fats, daily to have 5 or more servings of fruits and vegetables.  Biometrics:  Pre Biometrics - 04/25/21 0908       Pre Biometrics   % Body Fat --              Nutrition Therapy Plan and Nutrition Goals:  Nutrition Therapy & Goals - 05/01/21 1011       Nutrition Therapy   Diet TLC; carb modified  Personal Nutrition Goals   Nutrition Goal Pt to identify food quantities necessary to achieve weight loss of 6-24 lb at graduation from cardiac rehab.    Personal Goal #2 Pt to build a healthy plate including vegetables, fruits, whole grains, and low-fat dairy products in a heart healthy meal plan.    Personal Goal #3 Pt to incorporate high fiber foods like beans to meet fiber needs of 30 g/day      Intervention Plan   Intervention Prescribe, educate and counsel regarding individualized specific dietary modifications aiming towards targeted core components such as weight, hypertension, lipid management, diabetes, heart failure and other  comorbidities.;Nutrition handout(s) given to patient.    Expected Outcomes Short Term Goal: A plan has been developed with personal nutrition goals set during dietitian appointment.;Long Term Goal: Adherence to prescribed nutrition plan.             Nutrition Assessments:  MEDIFICTS Score Key: ?70 Need to make dietary changes  40-70 Heart Healthy Diet ? 40 Therapeutic Level Cholesterol Diet  Flowsheet Row CARDIAC REHAB PHASE II EXERCISE from 05/01/2021 in [place]  Picture Your Plate Total Score on Admission 66      Picture Your Plate Scores: <45 Unhealthy dietary pattern with much room for improvement. 41-50 Dietary pattern unlikely to meet recommendations for good health and room for improvement. 51-60 More healthful dietary pattern, with some room for improvement.  >60 Healthy dietary pattern, although there may be some specific behaviors that could be improved.    Nutrition Goals Re-Evaluation:  Nutrition Goals Re-Evaluation     [place] Name 05/01/21 1014 05/13/21 0840           Goals   Current Weight 238 lb (108 kg) 236 lb 1.8 oz (107.1 kg)      Nutrition Goal Pt to identify food quantities necessary to achieve weight loss of 6-24 lb at graduation from cardiac rehab. Pt to identify food quantities necessary to achieve weight loss of 6-24 lb at graduation from cardiac rehab.             Personal Goal #2 Re-Evaluation      Personal Goal #2 Pt to build a healthy plate including vegetables, fruits, whole grains, and low-fat dairy products in a heart healthy meal plan. Pt to build a healthy plate including vegetables, fruits, whole grains, and low-fat dairy products in a heart healthy meal plan.             Personal Goal #3 Re-Evaluation      Personal Goal #3 Pt to incorporate high fiber foods like beans to meet fiber needs of 30 g/day Pt to incorporate high fiber foods like beans to meet fiber needs of 30 g/day              Nutrition  Goals Discharge (Final Nutrition Goals Re-Evaluation):  Nutrition Goals Re-Evaluation - 05/13/21 0840       Goals   Current Weight 236 lb 1.8 oz (107.1 kg)    Nutrition Goal Pt to identify food quantities necessary to achieve weight loss of 6-24 lb at graduation from cardiac rehab.      Personal Goal #2 Re-Evaluation   Personal Goal #2 Pt to build a healthy plate including vegetables, fruits, whole grains, and low-fat dairy products in a heart healthy meal plan.      Personal Goal #3 Re-Evaluation   Personal Goal #3 Pt to incorporate high fiber foods like beans to meet fiber needs of 30  g/day             Psychosocial: Target Goals: Acknowledge presence or absence of significant depression and/or stress, maximize coping skills, provide positive support system. Participant is able to verbalize types and ability to use techniques and skills needed for reducing stress and depression.  Initial Review & Psychosocial Screening:  Initial Psych Review & Screening - 04/25/21 0819       Initial Review   Current issues with Current Stress Concerns    Source of Stress Concerns Family;Chronic Illness      Family Dynamics   Good Support System? Yes   Hunter Gomez is seperated he has his wife and twin brother for support     Barriers   Psychosocial barriers to participate in program There are no identifiable barriers or psychosocial needs.      Screening Interventions   Interventions Encouraged to exercise;Provide feedback about the scores to participant;To provide support and resources with identified psychosocial needs    Expected Outcomes Long Term Goal: Stressors or current issues are controlled or eliminated.             Quality of Life Scores:  Quality of Life - 04/25/21 0856       Quality of Life   Select Quality of Life      Quality of Life Scores   Health/Function Pre 20.23 %    Socioeconomic Pre 21.81 %    Psych/Spiritual Pre 18.43 %    Family Pre 14.1 %    GLOBAL Pre  19.36 %            Scores of 19 and below usually indicate a poorer quality of life in these areas.  A difference of  2-3 points is a clinically meaningful difference.  A difference of 2-3 points in the total score of the Quality of Life Index has been associated with significant improvement in overall quality of life, self-image, physical symptoms, and general health in studies assessing change in quality of life.  PHQ-9: Recent Review Flowsheet Data     Depression screen [facility] 2/9 04/25/2021 09/09/2017   Decreased Interest 0 0   Down, Depressed, Hopeless 0 0   PHQ - 2 Score 0 0      Interpretation of Total Score  Total Score Depression Severity:  1-4 = Minimal depression, 5-9 = Mild depression, 10-14 = Moderate depression, 15-19 = Moderately severe depression, 20-27 = Severe depression   Psychosocial Evaluation and Intervention:   Psychosocial Re-Evaluation:  Psychosocial Re-Evaluation     [place] Name 05/16/21 1156             Psychosocial Re-Evaluation   Current issues with Current Stress Concerns       Comments Hunter Gomez has not voiced any increased stressors or conerns since participating in phase 2 cardiac rehab       Expected Outcomes Hunter Gomez will have decreased stress upon completion of phase 2 cardiac rehab       Interventions Stress management education;Encouraged to attend Cardiac Rehabilitation for the exercise               Initial Review     Source of Stress Concerns Chronic Illness;Family       Comments Will conitnue to monitor and offer support as needed               Psychosocial Discharge (Final Psychosocial Re-Evaluation):  Psychosocial Re-Evaluation - 05/16/21 1156       Psychosocial Re-Evaluation   Current issues with Current  Stress Concerns    Comments Hunter Gomez has not voiced any increased stressors or conerns since participating in phase 2 cardiac rehab    Expected Outcomes Hunter Gomez will have decreased stress upon completion of phase 2 cardiac rehab     Interventions Stress management education;Encouraged to attend Cardiac Rehabilitation for the exercise      Initial Review   Source of Stress Concerns Chronic Illness;Family    Comments Will conitnue to monitor and offer support as needed             Vocational Rehabilitation: Provide vocational rehab assistance to qualifying candidates.   Vocational Rehab Evaluation & Intervention:  Vocational Rehab - 04/25/21 0831       Initial Vocational Rehab Evaluation & Intervention   Assessment shows need for Vocational Rehabilitation No   Hunter Gomez is retired and does not need vocational rehab at this time            Education: Education Goals: Education classes will be provided on a weekly basis, covering required topics. Participant will state understanding/return demonstration of topics presented.  Learning Barriers/Preferences:  Learning Barriers/Preferences - 04/25/21 0853       Learning Barriers/Preferences   Learning Barriers None    Learning Preferences Video;Pictoral;Computer/Internet             Education Topics: Hypertension, Hypertension Reduction -Define heart disease and high blood pressure. Discus how high blood pressure affects the body and ways to reduce high blood pressure.   Exercise and Your Heart -Discuss why it is important to exercise, the FITT principles of exercise, normal and abnormal responses to exercise, and how to exercise safely.   Angina -Discuss definition of angina, causes of angina, treatment of angina, and how to decrease risk of having angina.   Cardiac Medications -Review what the following cardiac medications are used for, how they affect the body, and side effects that may occur when taking the medications.  Medications include Aspirin, Beta blockers, calcium channel blockers, ACE Inhibitors, angiotensin receptor blockers, diuretics, digoxin, and antihyperlipidemics.   Congestive Heart Failure -Discuss the definition of CHF, how  to live with CHF, the signs and symptoms of CHF, and how keep track of weight and sodium intake.   Heart Disease and Intimacy -Discus the effect sexual activity has on the heart, how changes occur during intimacy as we age, and safety during sexual activity.   Smoking Cessation / COPD -Discuss different methods to quit smoking, the health benefits of quitting smoking, and the definition of COPD.   Nutrition I: Fats -Discuss the types of cholesterol, what cholesterol does to the heart, and how cholesterol levels can be controlled.   Nutrition II: Labels -Discuss the different components of food labels and how to read food label   Heart Parts/Heart Disease and PAD -Discuss the anatomy of the heart, the pathway of blood circulation through the heart, and these are affected by heart disease.   Stress I: Signs and Symptoms -Discuss the causes of stress, how stress may lead to anxiety and depression, and ways to limit stress.   Stress II: Relaxation -Discuss different types of relaxation techniques to limit stress.   Warning Signs of Stroke / TIA -Discuss definition of a stroke, what the signs and symptoms are of a stroke, and how to identify when someone is having stroke.   Knowledge Questionnaire Score:  Knowledge Questionnaire Score - 04/25/21 0849       Knowledge Questionnaire Score   Pre Score 22/24  Core Components/Risk Factors/Patient Goals at Admission:  Personal Goals and Risk Factors at Admission - 04/25/21 0853       Core Components/Risk Factors/Patient Goals on Admission    Weight Management Yes;Obesity;Weight Loss    Intervention Weight Management: Develop a combined nutrition and exercise program designed to reach desired caloric intake, while maintaining appropriate intake of nutrient and fiber, sodium and fats, and appropriate energy expenditure required for the weight goal.;Weight Management: Provide education and appropriate resources to  help participant work on and attain dietary goals.;Weight Management/Obesity: Establish reasonable short term and long term weight goals.;Obesity: Provide education and appropriate resources to help participant work on and attain dietary goals.    Admit Weight 238 lb 5.1 oz (108.1 kg)    Expected Outcomes Short Term: Continue to assess and modify interventions until short term weight is achieved;Long Term: Adherence to nutrition and physical activity/exercise program aimed toward attainment of established weight goal;Weight Maintenance: Understanding of the daily nutrition guidelines, which includes 25-35% calories from fat, 7% or less cal from saturated fats, less than 224m cholesterol, less than 1.5gm of sodium, & 5 or more servings of fruits and vegetables daily;Weight Loss: Understanding of general recommendations for a balanced deficit meal plan, which promotes 1-2 lb weight loss per week and includes a negative energy balance of [phone] kcal/d;Understanding recommendations for meals to include 15-35% energy as protein, 25-35% energy from fat, 35-60% energy from carbohydrates, less than 2031mof dietary cholesterol, 20-35 gm of total fiber daily;Understanding of distribution of calorie intake throughout the day with the consumption of 4-5 meals/snacks    Hypertension Yes    Intervention Provide education on lifestyle modifcations including regular physical activity/exercise, weight management, moderate sodium restriction and increased consumption of fresh fruit, vegetables, and low fat dairy, alcohol moderation, and smoking cessation.;Monitor prescription use compliance.    Expected Outcomes Short Term: Continued assessment and intervention until BP is < 140/9072mG in hypertensive participants. < 130/61m11m in hypertensive participants with diabetes, heart failure or chronic kidney disease.;Long Term: Maintenance of blood pressure at goal levels.    Lipids Yes    Intervention Provide education and  support for participant on nutrition & aerobic/resistive exercise along with prescribed medications to achieve LDL <70mg29mL >40mg.34mExpected Outcomes Short Term: Participant states understanding of desired cholesterol values and is compliant with medications prescribed. Participant is following exercise prescription and nutrition guidelines.;Long Term: Cholesterol controlled with medications as prescribed, with individualized exercise RX and with personalized nutrition plan. Value goals: LDL < 70mg, 95m> 40 mg.    Stress Yes    Intervention Offer individual and/or small group education and counseling on adjustment to heart disease, stress management and health-related lifestyle change. Teach and support self-help strategies.;Refer participants experiencing significant psychosocial distress to appropriate mental health specialists for further evaluation and treatment. When possible, include family members and significant others in education/counseling sessions.    Expected Outcomes Short Term: Participant demonstrates changes in health-related behavior, relaxation and other stress management skills, ability to obtain effective social support, and compliance with psychotropic medications if prescribed.;Long Term: Emotional wellbeing is indicated by absence of clinically significant psychosocial distress or social isolation.             Core Components/Risk Factors/Patient Goals Review:   Goals and Risk Factor Review     Row Name 05/16/21 1201             Core Components/Risk Factors/Patient Goals Review   Personal Goals Review Weight Management/Obesity;Stress;Hypertension;Lipids  Review Hunter Gomez has been doing great with exercise. Vital signs have been stable. Hunter Gomez has lost 1.8 kg since starting the program       Expected Outcomes Hunter Gomez will continue to participate in phase 2 cardiac rehab for exercise, nutrition and lifestyle modifications                Core Components/Risk  Factors/Patient Goals at Discharge (Final Review):   Goals and Risk Factor Review - 05/16/21 1201       Core Components/Risk Factors/Patient Goals Review   Personal Goals Review Weight Management/Obesity;Stress;Hypertension;Lipids    Review Hunter Gomez has been doing great with exercise. Vital signs have been stable. Hunter Gomez has lost 1.8 kg since starting the program    Expected Outcomes Hunter Gomez will continue to participate in phase 2 cardiac rehab for exercise, nutrition and lifestyle modifications             ITP Comments:  ITP Comments     Row Name 04/25/21 0818 05/16/21 1155         ITP Comments Dr Fransico Him MD, Medical Director 30 Day ITP Review. Hunter Gomez has good attendance and participation in phase 2 cardiac rehab               Comments:See ITP comments.Barnet Pall, RN,BSN 05/16/2021 12:06 PM

## 2021-05-17 ENCOUNTER — Other Ambulatory Visit: Payer: Self-pay

## 2021-05-17 ENCOUNTER — Encounter (HOSPITAL_COMMUNITY)
Admission: RE | Admit: 2021-05-17 | Discharge: 2021-05-17 | Disposition: A | Discharge status: Home / Self Care | Payer: Medicaid Other | Source: Ambulatory Visit | Attending: Cardiovascular Disease | Admitting: Cardiovascular Disease

## 2021-05-17 DIAGNOSIS — Z955 Presence of coronary angioplasty implant and graft: Secondary | ICD-10-CM

## 2021-05-20 ENCOUNTER — Other Ambulatory Visit: Payer: Self-pay

## 2021-05-20 ENCOUNTER — Encounter (HOSPITAL_COMMUNITY)
Admission: RE | Admit: 2021-05-20 | Discharge: 2021-05-20 | Disposition: A | Discharge status: Home / Self Care | Payer: Medicaid Other | Source: Ambulatory Visit | Attending: Cardiovascular Disease | Admitting: Cardiovascular Disease

## 2021-05-20 DIAGNOSIS — Z955 Presence of coronary angioplasty implant and graft: Secondary | ICD-10-CM

## 2021-05-22 ENCOUNTER — Encounter (HOSPITAL_COMMUNITY)
Admission: RE | Admit: 2021-05-22 | Discharge: 2021-05-22 | Disposition: A | Discharge status: Home / Self Care | Payer: Medicaid Other | Source: Ambulatory Visit | Attending: Cardiovascular Disease | Admitting: Cardiovascular Disease

## 2021-05-22 ENCOUNTER — Other Ambulatory Visit: Payer: Self-pay

## 2021-05-22 DIAGNOSIS — Z955 Presence of coronary angioplasty implant and graft: Secondary | ICD-10-CM

## 2021-05-24 ENCOUNTER — Encounter (HOSPITAL_COMMUNITY): Payer: Medicaid Other

## 2021-05-29 ENCOUNTER — Other Ambulatory Visit: Payer: Self-pay

## 2021-05-29 ENCOUNTER — Encounter (HOSPITAL_COMMUNITY)
Admission: RE | Admit: 2021-05-29 | Discharge: 2021-05-29 | Disposition: A | Discharge status: Home / Self Care | Payer: Medicaid Other | Source: Ambulatory Visit | Attending: Cardiovascular Disease | Admitting: Cardiovascular Disease

## 2021-05-29 ENCOUNTER — Telehealth: Payer: Self-pay | Admitting: Cardiovascular Disease

## 2021-05-29 DIAGNOSIS — Z48812 Encounter for surgical aftercare following surgery on the circulatory system: Secondary | ICD-10-CM | POA: Insufficient documentation

## 2021-05-29 DIAGNOSIS — Z955 Presence of coronary angioplasty implant and graft: Secondary | ICD-10-CM | POA: Insufficient documentation

## 2021-05-29 DIAGNOSIS — E782 Mixed hyperlipidemia: Secondary | ICD-10-CM

## 2021-05-29 MED ORDER — CLOPIDOGREL BISULFATE 75 MG PO TABS: 75.0000 mg | ORAL_TABLET | Freq: Every day | ORAL | 1 refills | Status: DC

## 2021-05-29 MED ORDER — ICOSAPENT ETHYL 1 G PO CAPS: 2.0000 g | ORAL_CAPSULE | Freq: Two times a day (BID) | ORAL | 3 refills | Status: DC

## 2021-05-29 MED ORDER — HYDROCHLOROTHIAZIDE 25 MG PO TABS: 25.0000 mg | ORAL_TABLET | Freq: Every day | ORAL | 1 refills | Status: DC

## 2021-05-29 MED ORDER — ISOSORBIDE MONONITRATE ER 30 MG PO TB24: 15.0000 mg | ORAL_TABLET | Freq: Every day | ORAL | 3 refills | Status: DC

## 2021-05-29 NOTE — Telephone Encounter (Signed)
Received a fax stating that pt needs a P/A for Vascepa. I have completed this on [employer] and submitted the information requested.  KEY: [id number]

## 2021-05-29 NOTE — Telephone Encounter (Signed)
Pt is returning call from a few days ago in regards to a new med he's supposed to be taking. Please advise pt further

## 2021-05-29 NOTE — Telephone Encounter (Signed)
Spoke with the patient who states that he is returning Danielle's call in regards to a new medication.   Beatrice Lecher, PA-C  05/08/2021 10:52 PM EDT      LFTs normal.  LDL is optimal.  Triglycerides elevated. A1c is good. PLAN:  - Continue current meds - Send copy to PCP  - Add Vascepa 2 grams twice daily (to get Triglycerides to goal of < 150) - Fasting lipids and LFTs in  3 mos [name], PA-C    05/08/2021 10:49 PM    Patient aware that Lorin Picket would like for him to start on Vascepa 2 grams twice daily. Rx has been sent in. Repeat lab work has been ordered. Patient will schedule labs when he comes in for his follow up visit in August

## 2021-05-31 ENCOUNTER — Other Ambulatory Visit: Payer: Self-pay

## 2021-05-31 ENCOUNTER — Encounter (HOSPITAL_COMMUNITY)
Admission: RE | Admit: 2021-05-31 | Discharge: 2021-05-31 | Disposition: A | Discharge status: Home / Self Care | Payer: Medicaid Other | Source: Ambulatory Visit | Attending: Cardiovascular Disease | Admitting: Cardiovascular Disease

## 2021-05-31 DIAGNOSIS — Z955 Presence of coronary angioplasty implant and graft: Secondary | ICD-10-CM

## 2021-05-31 DIAGNOSIS — Z48812 Encounter for surgical aftercare following surgery on the circulatory system: Secondary | ICD-10-CM | POA: Diagnosis not present

## 2021-05-31 NOTE — Telephone Encounter (Signed)
**Note De-Identified Senia Even Obfuscation** Letter received Orry Sigl fax from [facility] stating that they denied the pts Vascepa PA. Reason: Pt must first try and fail Fenofibrate (Generic Tricor 48 mg or 145 mg) and Gemfibrozil (generic Lopid).  Will forward this message to [name], PA-c and his nurse for advisement to the pt.

## 2021-06-03 ENCOUNTER — Encounter (HOSPITAL_COMMUNITY)
Admission: RE | Admit: 2021-06-03 | Discharge: 2021-06-03 | Disposition: A | Discharge status: Home / Self Care | Payer: Medicaid Other | Source: Ambulatory Visit | Attending: Cardiovascular Disease | Admitting: Cardiovascular Disease

## 2021-06-03 ENCOUNTER — Other Ambulatory Visit (HOSPITAL_BASED_OUTPATIENT_CLINIC_OR_DEPARTMENT_OTHER): Payer: Self-pay | Admitting: *Deleted

## 2021-06-03 ENCOUNTER — Other Ambulatory Visit: Payer: Self-pay

## 2021-06-03 DIAGNOSIS — Z955 Presence of coronary angioplasty implant and graft: Secondary | ICD-10-CM

## 2021-06-03 DIAGNOSIS — Z48812 Encounter for surgical aftercare following surgery on the circulatory system: Secondary | ICD-10-CM | POA: Diagnosis not present

## 2021-06-03 NOTE — Telephone Encounter (Signed)
He can take Fish Oil (over the counter) 2000 mg twice daily. [name], PA-C    06/03/2021 9:42 AM

## 2021-06-04 ENCOUNTER — Other Ambulatory Visit (HOSPITAL_BASED_OUTPATIENT_CLINIC_OR_DEPARTMENT_OTHER): Payer: Self-pay | Admitting: *Deleted

## 2021-06-04 MED ORDER — FISH OIL 1000 MG PO CAPS: 2.0000 | ORAL_CAPSULE | Freq: Two times a day (BID) | ORAL | 3 refills | Status: DC

## 2021-06-04 NOTE — Telephone Encounter (Signed)
Lvm with [name], PA-C recommendations.  Two capsules by mouth ( 2000 mg) twice daily. Medication list updated.

## 2021-06-05 ENCOUNTER — Encounter (HOSPITAL_COMMUNITY): Payer: Medicaid Other

## 2021-06-06 ENCOUNTER — Other Ambulatory Visit: Payer: Self-pay

## 2021-06-06 MED ORDER — FISH OIL 1000 MG PO CAPS: 2.0000 | ORAL_CAPSULE | Freq: Two times a day (BID) | ORAL | 3 refills | Status: AC

## 2021-06-06 NOTE — Telephone Encounter (Addendum)
**Note De-Identified Rafeal Skibicki Obfuscation** We continue to receive PA request for the pts Vascepa/Icosapent. [name], PA-c has switched the pt to Omega -3-ethyl esters 2000 mg BID due to the pts ins coverage. I have e-scribed the pts Omega-3-ethyl esters (Fish Oil) to [employer] with a message to he pharmacist that it is to replace the pts Icosapent/Vascepa.  I also left a message on [employer] pharmacy VM advising of this change as t the Icosapent/Vascepa PA is no longer needed.

## 2021-06-07 ENCOUNTER — Encounter (HOSPITAL_COMMUNITY)
Admission: RE | Admit: 2021-06-07 | Discharge: 2021-06-07 | Disposition: A | Discharge status: Home / Self Care | Payer: Medicaid Other | Source: Ambulatory Visit | Attending: Cardiovascular Disease | Admitting: Cardiovascular Disease

## 2021-06-07 ENCOUNTER — Ambulatory Visit: Payer: Medicaid Other | Admitting: Physician Assistant

## 2021-06-07 ENCOUNTER — Other Ambulatory Visit: Payer: Self-pay

## 2021-06-07 DIAGNOSIS — Z955 Presence of coronary angioplasty implant and graft: Secondary | ICD-10-CM

## 2021-06-07 DIAGNOSIS — Z48812 Encounter for surgical aftercare following surgery on the circulatory system: Secondary | ICD-10-CM | POA: Diagnosis not present

## 2021-06-10 ENCOUNTER — Other Ambulatory Visit: Payer: Self-pay

## 2021-06-10 ENCOUNTER — Encounter (HOSPITAL_COMMUNITY)
Admission: RE | Admit: 2021-06-10 | Discharge: 2021-06-10 | Disposition: A | Discharge status: Home / Self Care | Payer: Medicaid Other | Source: Ambulatory Visit | Attending: Cardiovascular Disease | Admitting: Cardiovascular Disease

## 2021-06-10 DIAGNOSIS — Z955 Presence of coronary angioplasty implant and graft: Secondary | ICD-10-CM

## 2021-06-10 DIAGNOSIS — Z48812 Encounter for surgical aftercare following surgery on the circulatory system: Secondary | ICD-10-CM | POA: Diagnosis not present

## 2021-06-10 NOTE — Progress Notes (Signed)
Cardiac Individual Treatment Plan  Patient Details  Name: Hunter Gomez MRN: [id number] Date of Birth: 07/31/1971 Referring Provider:   Flowsheet Row CARDIAC REHAB PHASE II ORIENTATION from 04/25/2021 in [facility]  Referring Provider Sherren Mocha, MD       Initial Encounter Date:  Flowsheet Row CARDIAC REHAB PHASE II ORIENTATION from 04/25/2021 in [facility]  Date 04/25/21       Visit Diagnosis: 02/13/21 S/P DES RCA  Patient's Home Medications on Admission:  Current Outpatient Medications:    Alirocumab (PRALUENT) 150 MG/ML SOAJ, Inject 1 pen into the skin every 14 (fourteen) days., Disp: 2 mL, Rfl: 11   aspirin 81 MG tablet, Take 81 mg by mouth daily., Disp: , Rfl:    Bempedoic Acid (NEXLETOL) 180 MG TABS, Take 1 tablet by mouth daily., Disp: 90 tablet, Rfl: 3   clopidogrel (PLAVIX) 75 MG tablet, Take 1 tablet (75 mg total) by mouth daily., Disp: 90 tablet, Rfl: 1   hydrochlorothiazide (HYDRODIURIL) 25 MG tablet, Take 1 tablet (25 mg total) by mouth daily., Disp: 30 tablet, Rfl: 1   isosorbide mononitrate (IMDUR) 30 MG 24 hr tablet, Take 0.5 tablets (15 mg total) by mouth daily., Disp: 15 tablet, Rfl: 3   lisinopril (ZESTRIL) 20 MG tablet, Take 1 tablet (20 mg total) by mouth daily., Disp: 90 tablet, Rfl: 2   metoprolol tartrate (LOPRESSOR) 50 MG tablet, Take 1 tablet (50 mg total) by mouth 2 (two) times daily with a meal., Disp: 180 tablet, Rfl: 3   nitroGLYCERIN (NITROSTAT) 0.4 MG SL tablet, Place 1 tablet (0.4 mg total) under the tongue every 5 (five) minutes as needed for chest pain., Disp: 30 tablet, Rfl: 6   Omega-3 Fatty Acids (FISH OIL) 1000 MG CAPS, Take 2 capsules (2,000 mg total) by mouth in the morning and at bedtime., Disp: 360 capsule, Rfl: 3   tamsulosin (FLOMAX) 0.4 MG CAPS capsule, TAKE 1 CAPSULE(0.4 MG) BY MOUTH DAILY AFTER BREAKFAST, Disp: 30 capsule, Rfl: 1  Past Medical History: Past Medical  History:  Diagnosis Date   CAD (coronary artery disease)    a.  NSTEMI (1/16):  LHC - dLM 95, oLAD 90, mLAD 30, oCFX 80-90, pCFX 50, oPDA 80, oPLA 75, EF 55-65% >> urgent CABG// Myoview 03/2020: EF 54, no ischemia or infarction; low risk   Hepatic steatosis    HLD (hyperlipidemia)    Hx of echocardiogram    Echo (2/16):  Mild LVH, EF 55-60%, Gr 2 DD, trivial AI/TR, no effusion   Hypertension 10/2012   Muscle weakness 1997   prior seizure like activity, but none since; prior neurology eval, prior normal EEG 1997   Obesity    s/p Lap Band surgery   Vision problem    prior use of glasses, not currently    Tobacco Use: Social History   Tobacco Use  Smoking Status Never  Smokeless Tobacco Never    Labs: Recent Review Flowsheet Data     Labs for ITP Cardiac and Pulmonary Rehab Latest Ref Rng & Units 07/12/2019 03/27/2020 08/29/2020 12/07/2020 05/08/2021   Cholestrol 100 - 199 mg/dL 140 139 170 153 107   LDLCALC 0 - 99 mg/dL 64 77 95 90 37   HDL >39 mg/dL 32(L) 34(L) 35(L) 33(L) 29(L)   Trlycerides 0 - 149 mg/dL 221(H) 163(H) 234(H) 175(H) 271(H)   Hemoglobin A1c 4.8 - 5.6 % - - - - 5.5   PHART 7.350 - 7.450 - - - - -  PCO2ART 35.0 - 45.0 mmHg - - - - -   HCO3 20.0 - 24.0 mEq/L - - - - -   TCO2 0 - 100 mmol/L - - - - -   ACIDBASEDEF 0.0 - 2.0 mmol/L - - - - -   O2SAT % - - - - -       Capillary Blood Glucose: Lab Results  Component Value Date   GLUCAP 111 (H) 12/21/2014   GLUCAP 122 (H) 12/21/2014   GLUCAP 152 (H) 12/21/2014   GLUCAP 116 (H) 12/20/2014   GLUCAP 129 (H) 12/20/2014     Exercise Target Goals: Exercise Program Goal: Individual exercise prescription set using results from initial 6 min walk test and THRR while considering  patient's activity barriers and safety.   Exercise Prescription Goal: Initial exercise prescription builds to 30-45 minutes a day of aerobic activity, 2-3 days per week.  Home exercise guidelines will be given to patient during program as  part of exercise prescription that the participant will acknowledge.  Activity Barriers & Risk Stratification:  Activity Barriers & Cardiac Risk Stratification - 04/25/21 0857       Activity Barriers & Cardiac Risk Stratification   Activity Barriers Other (comment)    Comments H/O Plantar Fasciitis, Dizziness with position change    Cardiac Risk Stratification High             6 Minute Walk:  6 Minute Walk     Row Name 04/25/21 0825         6 Minute Walk   Phase Initial     Distance 1756 feet     Walk Time 6 minutes     # of Rest Breaks 0     MPH 3.33     METS 4.12     RPE 7     Perceived Dyspnea  0     VO2 Peak 14.4     Symptoms No     Resting HR 62 bpm     Resting BP 122/70     Resting Oxygen Saturation  95 %     Exercise Oxygen Saturation  during 6 min walk 96 %     Max Ex. HR 77 bpm     Max Ex. BP 120/70     2 Minute Post BP 110/68              Oxygen Initial Assessment:   Oxygen Re-Evaluation:   Oxygen Discharge (Final Oxygen Re-Evaluation):   Initial Exercise Prescription:  Initial Exercise Prescription - 04/25/21 0900       Date of Initial Exercise RX and Referring Provider   Date 04/25/21    Referring Provider Sherren Mocha, MD    Expected Discharge Date 06/21/21      Bike   Level 2.5    Minutes 15    METs 3      NuStep   Level 3    SPM 85    Minutes 15    METs 2.5      Prescription Details   Frequency (times per week) 3    Duration Progress to 30 minutes of continuous aerobic without signs/symptoms of physical distress      Intensity   THRR 40-80% of Max Heartrate 68-137    Ratings of Perceived Exertion 11-13    Perceived Dyspnea 0-4      Progression   Progression Continue progressive overload as per policy without signs/symptoms or physical distress.      Resistance Training  Training Prescription Yes    Weight 5 lbs    Reps 10-15             Perform Capillary Blood Glucose checks as needed.  Exercise  Prescription Changes:   Exercise Prescription Changes     Row Name 04/29/21 1000 05/03/21 1000 05/13/21 0849 05/15/21 1015 05/29/21 1000     Response to Exercise   Blood Pressure (Admit) 118/68 124/80 112/60 116/72 132/82   Blood Pressure (Exercise) 140/70 160/80 124/70 124/78 166/80   Blood Pressure (Exit) 118/70 110/74 104/60 100/70 118/74   Heart Rate (Admit) 66 bpm 60 bpm 63 bpm 61 bpm 69 bpm   Heart Rate (Exercise) 79 bpm 91 bpm 97 bpm 104 bpm 119 bpm   Heart Rate (Exit) 66 bpm 60 bpm 60 bpm 63 bpm 69 bpm   Rating of Perceived Exertion (Exercise) 9 11 12.'5 12 12   ' Symptoms None None None None None   Comments Pt's first day in the CRP2 program Reviewed home exercise Rx Increased WL on bike and NuStep Reviewed MET' and Goals Reviewed METs   Duration Continue with 30 min of aerobic exercise without signs/symptoms of physical distress. Continue with 30 min of aerobic exercise without signs/symptoms of physical distress. Continue with 30 min of aerobic exercise without signs/symptoms of physical distress. Continue with 30 min of aerobic exercise without signs/symptoms of physical distress. Continue with 30 min of aerobic exercise without signs/symptoms of physical distress.   Intensity THRR unchanged THRR unchanged THRR unchanged THRR unchanged THRR unchanged     Progression   Progression Continue to progress workloads to maintain intensity without signs/symptoms of physical distress. Continue to progress workloads to maintain intensity without signs/symptoms of physical distress. Continue to progress workloads to maintain intensity without signs/symptoms of physical distress. Continue to progress workloads to maintain intensity without signs/symptoms of physical distress. Continue to progress workloads to maintain intensity without signs/symptoms of physical distress.   Average METs 3.35 3.45 3.65 3.65 5.2     Resistance Training   Training Prescription Yes Yes Yes No  No wts wednesday No    Weight 5 lbs 5 lbs 5 lbs 5 lbs No weights on Wednesdays   Reps 10-15 10-15 10-15 10-15 --   Time 10 Minutes 10 Minutes 10 Minutes 10 Minutes --     Interval Training   Interval Training No No No No No     Bike   Level 2.5 3 3.5 3.5 3.7   Minutes '15 15 15 15 15   ' METs 4 4.2 3.'7 5 6     ' NuStep   Level '3 3 4 4 5   ' SPM 85 85 85 85 95   Minutes '15 15 15 15 15   ' METs 2.7 2.7 3.6 3.8 4.4     Home Exercise Plan   Plans to continue exercise at -- [facility] (comment) Forensic scientist (comment) Forensic scientist (comment) Forensic scientist (comment)   Frequency -- Add 2 additional days to program exercise sessions. Add 2 additional days to program exercise sessions. Add 2 additional days to program exercise sessions. Add 2 additional days to program exercise sessions.   Initial Home Exercises Provided -- 05/03/21 05/03/21 05/03/21 05/03/21    Row Name 06/10/21 1200             Response to Exercise   Blood Pressure (Admit) 110/60       Blood Pressure (Exercise) 138/72       Blood Pressure (Exit) 104/64  Heart Rate (Admit) 64 bpm       Heart Rate (Exercise) 93 bpm       Heart Rate (Exit) 65 bpm       Rating of Perceived Exertion (Exercise) 13       Symptoms None       Comments Reviewed METs and Goals       Duration Continue with 30 min of aerobic exercise without signs/symptoms of physical distress.       Intensity THRR unchanged               Progression     Progression Continue to progress workloads to maintain intensity without signs/symptoms of physical distress.       Average METs 5.3               Resistance Training     Training Prescription Yes       Weight 6 lbs               Interval Training     Interval Training No               Bike     Level 3.7       Minutes 15       METs 6.5               NuStep     Level 5       SPM 95       Minutes 15       METs 4.1               Home Exercise Plan     Plans to continue exercise at  [facility] (comment)       Frequency Add 2 additional days to program exercise sessions.       Initial Home Exercises Provided 05/03/21               Exercise Comments:   Exercise Comments     Row Name 04/29/21 1018 05/03/21 1032 05/15/21 1030 05/29/21 1020 06/10/21 1200   Exercise Comments Pt's first day in the CRP2 program. Pt tolerated session well with no complaints. Pt is off to a good start. Reviewed home exercise Rx with patient. Pt will be exercising at a gym near his home 2-3 x/week using the Elliptical stepper. Reviewed METs and goals with pt today. Pt tolerated exercise well with an average MET level of 4.4. Pt feels he is working well towards his goals. Pt states the CRP2 program is helping him gain strength. Pt also states he is having a hard time losing wt, but he has met with the RD and hopes with exercise and diet change he can acieve his goals. Reviewed METs. Increase workload on bike to 3.7 and increased to level 5 on nustep. Pt tolerated increases well with no complaints voiced. Reviewed METs and Goals. Pt continues to make excellent progress in the CRP2 program.            Exercise Goals and Review:   Exercise Goals     Row Name 04/25/21 0856             Exercise Goals   Increase Physical Activity Yes       Intervention Provide advice, education, support and counseling about physical activity/exercise needs.;Develop an individualized exercise prescription for aerobic and resistive training based on initial evaluation findings, risk stratification, comorbidities and participant's personal goals.       Expected Outcomes  Short Term: Attend rehab on a regular basis to increase amount of physical activity.;Long Term: Add in home exercise to make exercise part of routine and to increase amount of physical activity.;Long Term: Exercising regularly at least 3-5 days a week.       Increase Strength and Stamina Yes       Intervention Provide advice, education,  support and counseling about physical activity/exercise needs.;Develop an individualized exercise prescription for aerobic and resistive training based on initial evaluation findings, risk stratification, comorbidities and participant's personal goals.       Expected Outcomes Short Term: Increase workloads from initial exercise prescription for resistance, speed, and METs.;Short Term: Perform resistance training exercises routinely during rehab and add in resistance training at home;Long Term: Improve cardiorespiratory fitness, muscular endurance and strength as measured by increased METs and functional capacity (6MWT)       Able to understand and use rate of perceived exertion (RPE) scale Yes       Intervention Provide education and explanation on how to use RPE scale       Expected Outcomes Short Term: Able to use RPE daily in rehab to express subjective intensity level;Long Term:  Able to use RPE to guide intensity level when exercising independently       Knowledge and understanding of Target Heart Rate Range (THRR) Yes       Intervention Provide education and explanation of THRR including how the numbers were predicted and where they are located for reference       Expected Outcomes Short Term: Able to state/look up THRR;Short Term: Able to use daily as guideline for intensity in rehab;Long Term: Able to use THRR to govern intensity when exercising independently       Understanding of Exercise Prescription Yes       Intervention Provide education, explanation, and written materials on patient's individual exercise prescription       Expected Outcomes Short Term: Able to explain program exercise prescription;Long Term: Able to explain home exercise prescription to exercise independently                Exercise Goals Re-Evaluation :  Exercise Goals Re-Evaluation     Row Name 04/29/21 1017 05/03/21 1034 05/15/21 1214 06/10/21 1248       Exercise Goal Re-Evaluation   Exercise Goals Review  Increase Physical Activity;Increase Strength and Stamina;Able to understand and use rate of perceived exertion (RPE) scale;Knowledge and understanding of Target Heart Rate Range (THRR);Understanding of Exercise Prescription Increase Physical Activity;Increase Strength and Stamina;Able to understand and use rate of perceived exertion (RPE) scale;Knowledge and understanding of Target Heart Rate Range (THRR);Able to check pulse independently;Understanding of Exercise Prescription Increase Strength and Stamina;Increase Physical Activity;Able to understand and use rate of perceived exertion (RPE) scale;Knowledge and understanding of Target Heart Rate Range (THRR);Understanding of Exercise Prescription Increase Physical Activity;Increase Strength and Stamina;Able to understand and use rate of perceived exertion (RPE) scale;Knowledge and understanding of Target Heart Rate Range (THRR);Able to check pulse independently;Understanding of Exercise Prescription    Comments Pt's first day in the CRP2 program. Pt understands the exercise Rx, THRR, and RPE scale. Reviewed home exercise Rx with patient today. Pt will use the Elliptical stepper 2-3 x/week at a gym near his home. Reviewed METs and goals with pt today. Pt tolerated exercise well with an average MET level of 4.4. Pt feels he is working well towards his goals. Pt states the CRP2 program is helping him gain strength. Pt also states he is having  a hard time losing wt, but he has met with the RD and homes with exercise and diet change he can acieve his goals. Reviewed METs and goals with pt today. Pt tolerated exercise well with an average MET level of 5.3. Pt feels he is working well towards his goals. Pt voices having some issues with getting his exercise in at home but he has been swimming with his children at the pool. Encouraged patient to enjoy the time with his kids and resume his regualr exercise as time permits. Pt is very motivated to continue exericse.     Expected Outcomes Will continue to monitor and progress patients as tolerated. Pt will exercise 2-3 x/week agt the gym for 30-45 minutes. Will continue to monitor pt and progress workloads as tolerated without sign or symptom. Will continue to monitor pt and progress workloads as tolerated without sign or symptom.             Discharge Exercise Prescription (Final Exercise Prescription Changes):  Exercise Prescription Changes - 06/10/21 1200       Response to Exercise   Blood Pressure (Admit) 110/60    Blood Pressure (Exercise) 138/72    Blood Pressure (Exit) 104/64    Heart Rate (Admit) 64 bpm    Heart Rate (Exercise) 93 bpm    Heart Rate (Exit) 65 bpm    Rating of Perceived Exertion (Exercise) 13    Symptoms None    Comments Reviewed METs and Goals    Duration Continue with 30 min of aerobic exercise without signs/symptoms of physical distress.    Intensity THRR unchanged      Progression   Progression Continue to progress workloads to maintain intensity without signs/symptoms of physical distress.    Average METs 5.3      Resistance Training   Training Prescription Yes    Weight 6 lbs      Interval Training   Interval Training No      Bike   Level 3.7    Minutes 15    METs 6.5      NuStep   Level 5    SPM 95    Minutes 15    METs 4.1      Home Exercise Plan   Plans to continue exercise at [facility] (comment)    Frequency Add 2 additional days to program exercise sessions.    Initial Home Exercises Provided 05/03/21             Nutrition:  Target Goals: Understanding of nutrition guidelines, daily intake of sodium <1570m, cholesterol <2066m calories 30% from fat and 7% or less from saturated fats, daily to have 5 or more servings of fruits and vegetables.  Biometrics:  Pre Biometrics - 04/25/21 0908       Pre Biometrics   % Body Fat --              Nutrition Therapy Plan and Nutrition Goals:  Nutrition Therapy & Goals - 05/01/21  1011       Nutrition Therapy   Diet TLC; carb modified      Personal Nutrition Goals   Nutrition Goal Pt to identify food quantities necessary to achieve weight loss of 6-24 lb at graduation from cardiac rehab.    Personal Goal #2 Pt to build a healthy plate including vegetables, fruits, whole grains, and low-fat dairy products in a heart healthy meal plan.    Personal Goal #3 Pt to incorporate high fiber foods like beans to  meet fiber needs of 30 g/day      Intervention Plan   Intervention Prescribe, educate and counsel regarding individualized specific dietary modifications aiming towards targeted core components such as weight, hypertension, lipid management, diabetes, heart failure and other comorbidities.;Nutrition handout(s) given to patient.    Expected Outcomes Short Term Goal: A plan has been developed with personal nutrition goals set during dietitian appointment.;Long Term Goal: Adherence to prescribed nutrition plan.             Nutrition Assessments:  MEDIFICTS Score Key: ?70 Need to make dietary changes  40-70 Heart Healthy Diet ? 40 Therapeutic Level Cholesterol Diet   Flowsheet Row CARDIAC REHAB PHASE II EXERCISE from 05/01/2021 in [facility]  Picture Your Plate Total Score on Admission 66      Picture Your Plate Scores: <29 Unhealthy dietary pattern with much room for improvement. 41-50 Dietary pattern unlikely to meet recommendations for good health and room for improvement. 51-60 More healthful dietary pattern, with some room for improvement.  >60 Healthy dietary pattern, although there may be some specific behaviors that could be improved.    Nutrition Goals Re-Evaluation:  Nutrition Goals Re-Evaluation     [place] Name 05/01/21 1014 05/13/21 0840 06/10/21 1141         Goals   Current Weight 238 lb (108 kg) 236 lb 1.8 oz (107.1 kg) 232 lb 9.4 oz (105.5 kg)     Nutrition Goal Pt to identify food quantities necessary to  achieve weight loss of 6-24 lb at graduation from cardiac rehab. Pt to identify food quantities necessary to achieve weight loss of 6-24 lb at graduation from cardiac rehab. Pt to identify food quantities necessary to achieve weight loss of 6-24 lb at graduation from cardiac rehab.     Comment -- -- Pt has lost 5 lbs           Personal Goal #2 Re-Evaluation       Personal Goal #2 Pt to build a healthy plate including vegetables, fruits, whole grains, and low-fat dairy products in a heart healthy meal plan. Pt to build a healthy plate including vegetables, fruits, whole grains, and low-fat dairy products in a heart healthy meal plan. Pt to build a healthy plate including vegetables, fruits, whole grains, and low-fat dairy products in a heart healthy meal plan.           Personal Goal #3 Re-Evaluation       Personal Goal #3 Pt to incorporate high fiber foods like beans to meet fiber needs of 30 g/day Pt to incorporate high fiber foods like beans to meet fiber needs of 30 g/day Pt to incorporate high fiber foods like beans to meet fiber needs of 30 g/day             Nutrition Goals Re-Evaluation:  Nutrition Goals Re-Evaluation     [place] Name 05/01/21 1014 05/13/21 0840 06/10/21 1141         Goals   Current Weight 238 lb (108 kg) 236 lb 1.8 oz (107.1 kg) 232 lb 9.4 oz (105.5 kg)     Nutrition Goal Pt to identify food quantities necessary to achieve weight loss of 6-24 lb at graduation from cardiac rehab. Pt to identify food quantities necessary to achieve weight loss of 6-24 lb at graduation from cardiac rehab. Pt to identify food quantities necessary to achieve weight loss of 6-24 lb at graduation from cardiac rehab.     Comment -- -- Pt  has lost 5 lbs           Personal Goal #2 Re-Evaluation       Personal Goal #2 Pt to build a healthy plate including vegetables, fruits, whole grains, and low-fat dairy products in a heart healthy meal plan. Pt to build a healthy plate including vegetables,  fruits, whole grains, and low-fat dairy products in a heart healthy meal plan. Pt to build a healthy plate including vegetables, fruits, whole grains, and low-fat dairy products in a heart healthy meal plan.           Personal Goal #3 Re-Evaluation       Personal Goal #3 Pt to incorporate high fiber foods like beans to meet fiber needs of 30 g/day Pt to incorporate high fiber foods like beans to meet fiber needs of 30 g/day Pt to incorporate high fiber foods like beans to meet fiber needs of 30 g/day             Nutrition Goals Discharge (Final Nutrition Goals Re-Evaluation):  Nutrition Goals Re-Evaluation - 06/10/21 1141       Goals   Current Weight 232 lb 9.4 oz (105.5 kg)    Nutrition Goal Pt to identify food quantities necessary to achieve weight loss of 6-24 lb at graduation from cardiac rehab.    Comment Pt has lost 5 lbs      Personal Goal #2 Re-Evaluation   Personal Goal #2 Pt to build a healthy plate including vegetables, fruits, whole grains, and low-fat dairy products in a heart healthy meal plan.      Personal Goal #3 Re-Evaluation   Personal Goal #3 Pt to incorporate high fiber foods like beans to meet fiber needs of 30 g/day             Psychosocial: Target Goals: Acknowledge presence or absence of significant depression and/or stress, maximize coping skills, provide positive support system. Participant is able to verbalize types and ability to use techniques and skills needed for reducing stress and depression.  Initial Review & Psychosocial Screening:  Initial Psych Review & Screening - 04/25/21 0819       Initial Review   Current issues with Current Stress Concerns    Source of Stress Concerns Family;Chronic Illness      Family Dynamics   Good Support System? Yes   Hunter Gomez is seperated he has his wife and twin brother for support     Barriers   Psychosocial barriers to participate in program There are no identifiable barriers or psychosocial needs.       Screening Interventions   Interventions Encouraged to exercise;Provide feedback about the scores to participant;To provide support and resources with identified psychosocial needs    Expected Outcomes Long Term Goal: Stressors or current issues are controlled or eliminated.             Quality of Life Scores:  Quality of Life - 04/25/21 0856       Quality of Life   Select Quality of Life      Quality of Life Scores   Health/Function Pre 20.23 %    Socioeconomic Pre 21.81 %    Psych/Spiritual Pre 18.43 %    Family Pre 14.1 %    GLOBAL Pre 19.36 %            Scores of 19 and below usually indicate a poorer quality of life in these areas.  A difference of  2-3 points is a clinically meaningful difference.  A difference of 2-3 points in the total score of the Quality of Life Index has been associated with significant improvement in overall quality of life, self-image, physical symptoms, and general health in studies assessing change in quality of life.  PHQ-9: Recent Review Flowsheet Data     Depression screen [facility] 2/9 04/25/2021 09/09/2017   Decreased Interest 0 0   Down, Depressed, Hopeless 0 0   PHQ - 2 Score 0 0      Interpretation of Total Score  Total Score Depression Severity:  1-4 = Minimal depression, 5-9 = Mild depression, 10-14 = Moderate depression, 15-19 = Moderately severe depression, 20-27 = Severe depression   Psychosocial Evaluation and Intervention:   Psychosocial Re-Evaluation:  Psychosocial Re-Evaluation     [place] Name 05/16/21 1156 06/12/21 1045           Psychosocial Re-Evaluation   Current issues with Current Stress Concerns Current Stress Concerns      Comments Hunter Gomez has not voiced any increased stressors or conerns since participating in phase 2 cardiac rehab Hunter Gomez continues to not voice any increased  stressors or conerns since participating in phase 2 cardiac rehab      Expected Outcomes Hunter Gomez will have decreased stress upon completion of  phase 2 cardiac rehab Hunter Gomez will have decreased stress upon completion of phase 2 cardiac rehab      Interventions Stress management education;Encouraged to attend Cardiac Rehabilitation for the exercise Stress management education;Encouraged to attend Cardiac Rehabilitation for the exercise             Initial Review      Source of Stress Concerns Chronic Illness;Family Chronic Illness;Family      Comments Will conitnue to monitor and offer support as needed Will conitnue to monitor and offer support as needed              Psychosocial Discharge (Final Psychosocial Re-Evaluation):  Psychosocial Re-Evaluation - 06/12/21 1045       Psychosocial Re-Evaluation   Current issues with Current Stress Concerns    Comments Hunter Gomez continues to not voice any increased  stressors or conerns since participating in phase 2 cardiac rehab    Expected Outcomes Hunter Gomez will have decreased stress upon completion of phase 2 cardiac rehab    Interventions Stress management education;Encouraged to attend Cardiac Rehabilitation for the exercise      Initial Review   Source of Stress Concerns Chronic Illness;Family    Comments Will conitnue to monitor and offer support as needed             Vocational Rehabilitation: Provide vocational rehab assistance to qualifying candidates.   Vocational Rehab Evaluation & Intervention:  Vocational Rehab - 04/25/21 0831       Initial Vocational Rehab Evaluation & Intervention   Assessment shows need for Vocational Rehabilitation No   Hunter Gomez is retired and does not need vocational rehab at this time            Education: Education Goals: Education classes will be provided on a weekly basis, covering required topics. Participant will state understanding/return demonstration of topics presented.  Learning Barriers/Preferences:  Learning Barriers/Preferences - 04/25/21 0853       Learning Barriers/Preferences   Learning Barriers None    Learning Preferences  Video;Pictoral;Computer/Internet             Education Topics: Count Your Pulse:  -Group instruction provided by verbal instruction, demonstration, patient participation and written materials to support subject.  Instructors address importance of being  able to find your pulse and how to count your pulse when at home without a heart monitor.  Patients get hands on experience counting their pulse with staff help and individually.   Heart Attack, Angina, and Risk Factor Modification:  -Group instruction provided by verbal instruction, video, and written materials to support subject.  Instructors address signs and symptoms of angina and heart attacks.    Also discuss risk factors for heart disease and how to make changes to improve heart health risk factors.   Functional Fitness:  -Group instruction provided by verbal instruction, demonstration, patient participation, and written materials to support subject.  Instructors address safety measures for doing things around the house.  Discuss how to get up and down off the floor, how to pick things up properly, how to safely get out of a chair without assistance, and balance training.   Meditation and Mindfulness:  -Group instruction provided by verbal instruction, patient participation, and written materials to support subject.  Instructor addresses importance of mindfulness and meditation practice to help reduce stress and improve awareness.  Instructor also leads participants through a meditation exercise.    Stretching for Flexibility and Mobility:  -Group instruction provided by verbal instruction, patient participation, and written materials to support subject.  Instructors lead participants through series of stretches that are designed to increase flexibility thus improving mobility.  These stretches are additional exercise for major muscle groups that are typically performed during regular warm up and cool down.   Hands Only CPR:   -Group verbal, video, and participation provides a basic overview of AHA guidelines for community CPR. Role-play of emergencies allow participants the opportunity to practice calling for help and chest compression technique with discussion of AED use.   Hypertension: -Group verbal and written instruction that provides a basic overview of hypertension including the most recent diagnostic guidelines, risk factor reduction with self-care instructions and medication management.    Nutrition I class: Heart Healthy Eating:  -Group instruction provided by PowerPoint slides, verbal discussion, and written materials to support subject matter. The instructor gives an explanation and review of the Therapeutic Lifestyle Changes diet recommendations, which includes a discussion on lipid goals, dietary fat, sodium, fiber, plant stanol/sterol esters, sugar, and the components of a well-balanced, healthy diet.   Nutrition II class: Lifestyle Skills:  -Group instruction provided by PowerPoint slides, verbal discussion, and written materials to support subject matter. The instructor gives an explanation and review of label reading, grocery shopping for heart health, heart healthy recipe modifications, and ways to make healthier choices when eating out.   Diabetes Question & Answer:  -Group instruction provided by PowerPoint slides, verbal discussion, and written materials to support subject matter. The instructor gives an explanation and review of diabetes co-morbidities, pre- and post-prandial blood glucose goals, pre-exercise blood glucose goals, signs, symptoms, and treatment of hypoglycemia and hyperglycemia, and foot care basics.   Diabetes Blitz:  -Group instruction provided by PowerPoint slides, verbal discussion, and written materials to support subject matter. The instructor gives an explanation and review of the physiology behind type 1 and type 2 diabetes, diabetes medications and rational behind  using different medications, pre- and post-prandial blood glucose recommendations and Hemoglobin A1c goals, diabetes diet, and exercise including blood glucose guidelines for exercising safely.    Portion Distortion:  -Group instruction provided by PowerPoint slides, verbal discussion, written materials, and food models to support subject matter. The instructor gives an explanation of serving size versus portion size, changes in portions sizes over  the last 20 years, and what consists of a serving from each food group.   Stress Management:  -Group instruction provided by verbal instruction, video, and written materials to support subject matter.  Instructors review role of stress in heart disease and how to cope with stress positively.     Exercising on Your Own:  -Group instruction provided by verbal instruction, power point, and written materials to support subject.  Instructors discuss benefits of exercise, components of exercise, frequency and intensity of exercise, and end points for exercise.  Also discuss use of nitroglycerin and activating EMS.  Review options of places to exercise outside of rehab.  Review guidelines for sex with heart disease.   Cardiac Drugs I:  -Group instruction provided by verbal instruction and written materials to support subject.  Instructor reviews cardiac drug classes: antiplatelets, anticoagulants, beta blockers, and statins.  Instructor discusses reasons, side effects, and lifestyle considerations for each drug class.   Cardiac Drugs II:  -Group instruction provided by verbal instruction and written materials to support subject.  Instructor reviews cardiac drug classes: angiotensin converting enzyme inhibitors (ACE-I), angiotensin II receptor blockers (ARBs), nitrates, and calcium channel blockers.  Instructor discusses reasons, side effects, and lifestyle considerations for each drug class.   Anatomy and Physiology of the Circulatory System:  Group  verbal and written instruction and models provide basic cardiac anatomy and physiology, with the coronary electrical and arterial systems. Review of: AMI, Angina, Valve disease, Heart Failure, Peripheral Artery Disease, Cardiac Arrhythmia, Pacemakers, and the ICD.   Other Education:  -Group or individual verbal, written, or video instructions that support the educational goals of the cardiac rehab program.   Holiday Eating Survival Tips:  -Group instruction provided by PowerPoint slides, verbal discussion, and written materials to support subject matter. The instructor gives patients tips, tricks, and techniques to help them not only survive but enjoy the holidays despite the onslaught of food that accompanies the holidays.   Knowledge Questionnaire Score:  Knowledge Questionnaire Score - 04/25/21 0849       Knowledge Questionnaire Score   Pre Score 22/24             Core Components/Risk Factors/Patient Goals at Admission:  Personal Goals and Risk Factors at Admission - 04/25/21 0853       Core Components/Risk Factors/Patient Goals on Admission    Weight Management Yes;Obesity;Weight Loss    Intervention Weight Management: Develop a combined nutrition and exercise program designed to reach desired caloric intake, while maintaining appropriate intake of nutrient and fiber, sodium and fats, and appropriate energy expenditure required for the weight goal.;Weight Management: Provide education and appropriate resources to help participant work on and attain dietary goals.;Weight Management/Obesity: Establish reasonable short term and long term weight goals.;Obesity: Provide education and appropriate resources to help participant work on and attain dietary goals.    Admit Weight 238 lb 5.1 oz (108.1 kg)    Expected Outcomes Short Term: Continue to assess and modify interventions until short term weight is achieved;Long Term: Adherence to nutrition and physical activity/exercise program  aimed toward attainment of established weight goal;Weight Maintenance: Understanding of the daily nutrition guidelines, which includes 25-35% calories from fat, 7% or less cal from saturated fats, less than 247m cholesterol, less than 1.5gm of sodium, & 5 or more servings of fruits and vegetables daily;Weight Loss: Understanding of general recommendations for a balanced deficit meal plan, which promotes 1-2 lb weight loss per week and includes a negative energy balance of [phone] kcal/d;Understanding recommendations for  meals to include 15-35% energy as protein, 25-35% energy from fat, 35-60% energy from carbohydrates, less than 242m of dietary cholesterol, 20-35 gm of total fiber daily;Understanding of distribution of calorie intake throughout the day with the consumption of 4-5 meals/snacks    Hypertension Yes    Intervention Provide education on lifestyle modifcations including regular physical activity/exercise, weight management, moderate sodium restriction and increased consumption of fresh fruit, vegetables, and low fat dairy, alcohol moderation, and smoking cessation.;Monitor prescription use compliance.    Expected Outcomes Short Term: Continued assessment and intervention until BP is < 140/93mHG in hypertensive participants. < 130/8042mG in hypertensive participants with diabetes, heart failure or chronic kidney disease.;Long Term: Maintenance of blood pressure at goal levels.    Lipids Yes    Intervention Provide education and support for participant on nutrition & aerobic/resistive exercise along with prescribed medications to achieve LDL <23m6mDL >40mg13m Expected Outcomes Short Term: Participant states understanding of desired cholesterol values and is compliant with medications prescribed. Participant is following exercise prescription and nutrition guidelines.;Long Term: Cholesterol controlled with medications as prescribed, with individualized exercise RX and with personalized  nutrition plan. Value goals: LDL < 23mg,68m > 40 mg.    Stress Yes    Intervention Offer individual and/or small group education and counseling on adjustment to heart disease, stress management and health-related lifestyle change. Teach and support self-help strategies.;Refer participants experiencing significant psychosocial distress to appropriate mental health specialists for further evaluation and treatment. When possible, include family members and significant others in education/counseling sessions.    Expected Outcomes Short Term: Participant demonstrates changes in health-related behavior, relaxation and other stress management skills, ability to obtain effective social support, and compliance with psychotropic medications if prescribed.;Long Term: Emotional wellbeing is indicated by absence of clinically significant psychosocial distress or social isolation.             Core Components/Risk Factors/Patient Goals Review:   Goals and Risk Factor Review     Row Name 05/16/21 1201 06/12/21 1045           Core Components/Risk Factors/Patient Goals Review   Personal Goals Review Weight Management/Obesity;Stress;Hypertension;Lipids Weight Management/Obesity;Stress;Hypertension;Lipids      Review Brad hLeroy Seaeen doing great with exercise. Vital signs have been stable. Brad hLeroy Seaost 1.8 kg since starting the program Brad hLeroy Seaeen doing great with exercise. Vital signs have been stable. Brad hLeroy Seaost 2.2 kg since starting the program      Expected Outcomes Brad wLeroy Seacontinue to participate in phase 2 cardiac rehab for exercise, nutrition and lifestyle modifications Brad wLeroy Seacontinue to participate in phase 2 cardiac rehab for exercise, nutrition and lifestyle modifications               Core Components/Risk Factors/Patient Goals at Discharge (Final Review):   Goals and Risk Factor Review - 06/12/21 1045       Core Components/Risk Factors/Patient Goals Review   Personal Goals Review  Weight Management/Obesity;Stress;Hypertension;Lipids    Review Brad hLeroy Seaeen doing great with exercise. Vital signs have been stable. Brad hLeroy Seaost 2.2 kg since starting the program    Expected Outcomes Brad wLeroy Seacontinue to participate in phase 2 cardiac rehab for exercise, nutrition and lifestyle modifications             ITP Comments:  ITP Comments     Row Name 04/25/21 0818 05/16/21 1155 06/12/21 1044       ITP Comments Dr Traci Fransico Himedical Director  30 Day ITP Review. Hunter Gomez has good attendance and participation in phase 2 cardiac rehab 30 Day ITP Review. Hunter Gomez continues to have  good attendance and participation in phase 2 cardiac rehab              Comments: See ITP Comments

## 2021-06-12 ENCOUNTER — Other Ambulatory Visit: Payer: Self-pay

## 2021-06-12 ENCOUNTER — Encounter (HOSPITAL_COMMUNITY)
Admission: RE | Admit: 2021-06-12 | Discharge: 2021-06-12 | Disposition: A | Discharge status: Home / Self Care | Payer: Medicaid Other | Source: Ambulatory Visit | Attending: Cardiovascular Disease | Admitting: Cardiovascular Disease

## 2021-06-12 DIAGNOSIS — Z48812 Encounter for surgical aftercare following surgery on the circulatory system: Secondary | ICD-10-CM | POA: Diagnosis not present

## 2021-06-12 DIAGNOSIS — Z955 Presence of coronary angioplasty implant and graft: Secondary | ICD-10-CM

## 2021-06-14 ENCOUNTER — Encounter (HOSPITAL_COMMUNITY)
Admission: RE | Admit: 2021-06-14 | Discharge: 2021-06-14 | Disposition: A | Discharge status: Home / Self Care | Payer: Medicaid Other | Source: Ambulatory Visit | Attending: Cardiovascular Disease | Admitting: Cardiovascular Disease

## 2021-06-14 ENCOUNTER — Other Ambulatory Visit: Payer: Self-pay

## 2021-06-14 DIAGNOSIS — Z955 Presence of coronary angioplasty implant and graft: Secondary | ICD-10-CM

## 2021-06-14 DIAGNOSIS — Z48812 Encounter for surgical aftercare following surgery on the circulatory system: Secondary | ICD-10-CM | POA: Diagnosis not present

## 2021-06-17 ENCOUNTER — Other Ambulatory Visit: Payer: Self-pay

## 2021-06-17 ENCOUNTER — Encounter (HOSPITAL_COMMUNITY)
Admission: RE | Admit: 2021-06-17 | Discharge: 2021-06-17 | Disposition: A | Discharge status: Home / Self Care | Payer: Medicaid Other | Source: Ambulatory Visit | Attending: Cardiovascular Disease | Admitting: Cardiovascular Disease

## 2021-06-17 DIAGNOSIS — Z48812 Encounter for surgical aftercare following surgery on the circulatory system: Secondary | ICD-10-CM | POA: Diagnosis not present

## 2021-06-17 DIAGNOSIS — Z955 Presence of coronary angioplasty implant and graft: Secondary | ICD-10-CM

## 2021-06-19 ENCOUNTER — Encounter (HOSPITAL_COMMUNITY)
Admission: RE | Admit: 2021-06-19 | Discharge: 2021-06-19 | Disposition: A | Discharge status: Home / Self Care | Payer: Medicaid Other | Source: Ambulatory Visit | Attending: Cardiovascular Disease | Admitting: Cardiovascular Disease

## 2021-06-19 ENCOUNTER — Other Ambulatory Visit: Payer: Self-pay

## 2021-06-19 VITALS — Ht 69.0 in | Wt 229.9 lb

## 2021-06-19 DIAGNOSIS — Z955 Presence of coronary angioplasty implant and graft: Secondary | ICD-10-CM

## 2021-06-19 DIAGNOSIS — Z48812 Encounter for surgical aftercare following surgery on the circulatory system: Secondary | ICD-10-CM | POA: Diagnosis not present

## 2021-06-21 ENCOUNTER — Encounter (HOSPITAL_COMMUNITY)
Admission: RE | Admit: 2021-06-21 | Discharge: 2021-06-21 | Disposition: A | Discharge status: Home / Self Care | Payer: Medicaid Other | Source: Ambulatory Visit | Attending: Cardiovascular Disease | Admitting: Cardiovascular Disease

## 2021-06-21 ENCOUNTER — Other Ambulatory Visit: Payer: Self-pay

## 2021-06-21 DIAGNOSIS — Z48812 Encounter for surgical aftercare following surgery on the circulatory system: Secondary | ICD-10-CM | POA: Diagnosis not present

## 2021-06-21 DIAGNOSIS — Z955 Presence of coronary angioplasty implant and graft: Secondary | ICD-10-CM

## 2021-06-21 NOTE — Progress Notes (Signed)
Discharge Progress Report  Patient Details  Name: Hunter Gomez MRN: [id number] Date of Birth: May 14, 1971 Referring Provider:   Flowsheet Row CARDIAC REHAB PHASE II ORIENTATION from 04/25/2021 in [facility]  Referring Provider Sherren Mocha, MD        Number of Visits: 21  Reason for Discharge:  Patient reached a stable level of exercise. Patient independent in their exercise. Patient has met program and personal goals.  Smoking History:  Social History   Tobacco Use  Smoking Status Never  Smokeless Tobacco Never    Diagnosis:  02/13/21 S/P DES RCA  ADL [facility]SD:   Initial Exercise Prescription:  Initial Exercise Prescription - 04/25/21 0900       Date of Initial Exercise RX and Referring Provider   Date 04/25/21    Referring Provider Sherren Mocha, MD    Expected Discharge Date 06/21/21      Bike   Level 2.5    Minutes 15    METs 3      NuStep   Level 3    SPM 85    Minutes 15    METs 2.5      Prescription Details   Frequency (times per week) 3    Duration Progress to 30 minutes of continuous aerobic without signs/symptoms of physical distress      Intensity   THRR 40-80% of Max Heartrate 68-137    Ratings of Perceived Exertion 11-13    Perceived Dyspnea 0-4      Progression   Progression Continue progressive overload as per policy without signs/symptoms or physical distress.      Resistance Training   Training Prescription Yes    Weight 5 lbs    Reps 10-15             Discharge Exercise Prescription (Final Exercise Prescription Changes):  Exercise Prescription Changes - 06/21/21 1000       Response to Exercise   Blood Pressure (Admit) 138/72    Blood Pressure (Exercise) 162/72    Blood Pressure (Exit) 102/70    Heart Rate (Admit) 69 bpm    Heart Rate (Exercise) 100 bpm    Heart Rate (Exit) 65 bpm    Rating of Perceived Exertion (Exercise) 12    Symptoms None    Comments Pt graduated from the CRP2  program today    Duration Continue with 30 min of aerobic exercise without signs/symptoms of physical distress.    Intensity THRR unchanged      Progression   Progression Continue to progress workloads to maintain intensity without signs/symptoms of physical distress.    Average METs 5.6      Resistance Training   Training Prescription Yes    Weight 6 lbs    Reps 10-15    Time 10 Minutes      Interval Training   Interval Training No      Bike   Level 3.7    Minutes 15    METs 6.6      NuStep   Level 5    SPM 105    Minutes 15    METs 4.6      Home Exercise Plan   Plans to continue exercise at [employer] (comment)    Frequency Add 2 additional days to program exercise sessions.    Initial Home Exercises Provided 05/03/21             Functional Capacity:  6 Minute Walk     Row Name  04/25/21 0825 06/17/21 0847       6 Minute Walk   Phase Initial Discharge    Distance 1756 feet 1943 feet    Distance % Change -- 10.6 %    Distance Feet Change -- 187 ft    Walk Time 6 minutes 6 minutes    # of Rest Breaks 0 0    MPH 3.33 3.68    METS 4.12 4.5    RPE 7 11    Perceived Dyspnea  0 0    VO2 Peak 14.4 15.71    Symptoms No No    Resting HR 62 bpm 52 bpm    Resting BP 122/70 104/60    Resting Oxygen Saturation  95 % 96 %    Exercise Oxygen Saturation  during 6 min walk 96 % 97 %    Max Ex. HR 77 bpm 70 bpm    Max Ex. BP 120/70 132/78    2 Minute Post BP 110/68 --             Psychological, QOL, Others - Outcomes: PHQ 2/9: Depression screen [facility] 2/9 06/21/2021 04/25/2021 09/09/2017  Decreased Interest 0 0 0  Down, Depressed, Hopeless 0 0 0  PHQ - 2 Score 0 0 0    Quality of Life:  Quality of Life - 06/17/21 1000       Quality of Life Scores   Health/Function Pre 20.23 %    Health/Function Post 22.1 %    Health/Function % Change 9.24 %    Socioeconomic Pre 21.81 %    Socioeconomic Post 22.4 %    Socioeconomic % Change  2.71 %     Psych/Spiritual Pre 18.43 %    Psych/Spiritual Post 15.86 %    Psych/Spiritual % Change -13.94 %    Family Pre 14.1 %    Family Post 18.2 %    Family % Change 29.08 %    GLOBAL Pre 19.36 %    GLOBAL Post 20.37 %    GLOBAL % Change 5.22 %             Personal Goals: Goals established at orientation with interventions provided to work toward goal.  Personal Goals and Risk Factors at Admission - 04/25/21 0853       Core Components/Risk Factors/Patient Goals on Admission    Weight Management Yes;Obesity;Weight Loss    Intervention Weight Management: Develop a combined nutrition and exercise program designed to reach desired caloric intake, while maintaining appropriate intake of nutrient and fiber, sodium and fats, and appropriate energy expenditure required for the weight goal.;Weight Management: Provide education and appropriate resources to help participant work on and attain dietary goals.;Weight Management/Obesity: Establish reasonable short term and long term weight goals.;Obesity: Provide education and appropriate resources to help participant work on and attain dietary goals.    Admit Weight 238 lb 5.1 oz (108.1 kg)    Expected Outcomes Short Term: Continue to assess and modify interventions until short term weight is achieved;Long Term: Adherence to nutrition and physical activity/exercise program aimed toward attainment of established weight goal;Weight Maintenance: Understanding of the daily nutrition guidelines, which includes 25-35% calories from fat, 7% or less cal from saturated fats, less than 224m cholesterol, less than 1.5gm of sodium, & 5 or more servings of fruits and vegetables daily;Weight Loss: Understanding of general recommendations for a balanced deficit meal plan, which promotes 1-2 lb weight loss per week and includes a negative energy balance of [id number] kcal/d;Understanding recommendations for meals to  include 15-35% energy as protein, 25-35% energy from fat,  35-60% energy from carbohydrates, less than 232m of dietary cholesterol, 20-35 gm of total fiber daily;Understanding of distribution of calorie intake throughout the day with the consumption of 4-5 meals/snacks    Hypertension Yes    Intervention Provide education on lifestyle modifcations including regular physical activity/exercise, weight management, moderate sodium restriction and increased consumption of fresh fruit, vegetables, and low fat dairy, alcohol moderation, and smoking cessation.;Monitor prescription use compliance.    Expected Outcomes Short Term: Continued assessment and intervention until BP is < 140/955mHG in hypertensive participants. < 130/8064mG in hypertensive participants with diabetes, heart failure or chronic kidney disease.;Long Term: Maintenance of blood pressure at goal levels.    Lipids Yes    Intervention Provide education and support for participant on nutrition & aerobic/resistive exercise along with prescribed medications to achieve LDL <60m49mDL >40mg51m Expected Outcomes Short Term: Participant states understanding of desired cholesterol values and is compliant with medications prescribed. Participant is following exercise prescription and nutrition guidelines.;Long Term: Cholesterol controlled with medications as prescribed, with individualized exercise RX and with personalized nutrition plan. Value goals: LDL < 60mg,73m > 40 mg.    Stress Yes    Intervention Offer individual and/or small group education and counseling on adjustment to heart disease, stress management and health-related lifestyle change. Teach and support self-help strategies.;Refer participants experiencing significant psychosocial distress to appropriate mental health specialists for further evaluation and treatment. When possible, include family members and significant others in education/counseling sessions.    Expected Outcomes Short Term: Participant demonstrates changes in health-related  behavior, relaxation and other stress management skills, ability to obtain effective social support, and compliance with psychotropic medications if prescribed.;Long Term: Emotional wellbeing is indicated by absence of clinically significant psychosocial distress or social isolation.              Personal Goals Discharge:  Goals and Risk Factor Review     Row Name 05/16/21 1201 06/12/21 1045 06/21/21 0953         Core Components/Risk Factors/Patient Goals Review   Personal Goals Review Weight Management/Obesity;Stress;Hypertension;Lipids Weight Management/Obesity;Stress;Hypertension;Lipids Weight Management/Obesity;Stress;Hypertension;Lipids     Review Brad hLeroy Seaeen doing great with exercise. Vital signs have been stable. Brad hLeroy Seaost 1.8 kg since starting the program Brad hLeroy Seaeen doing great with exercise. Vital signs have been stable. Brad hLeroy Seaost 2.2 kg since starting the program Brad did well with exercise. Vital signs have been stable. Brad hLeroy Seaost 2.2 kg since starting the program. Brad completed exercise at cardiac rehab on 06/21/21. Brad plans to continue exercise at [employer].     Expected Outcomes Brad wLeroy Seacontinue to participate in phase 2 cardiac rehab for exercise, nutrition and lifestyle modifications Brad wLeroy [place]inue to participate in phase 2 cardiac rehab for exercise, nutrition and lifestyle modifications Brad wLeroy Seacontinue to  exercise, follow nutrition and lifestyle modifications upon completion of phase 2 cardiac rehab.              Exercise Goals and Review:  Exercise Goals     Row Name 04/25/21 0856             Exercise Goals   Increase Physical Activity Yes       Intervention Provide advice, education, support and counseling about physical activity/exercise needs.;Develop an individualized exercise prescription for aerobic and resistive training based on initial evaluation findings, risk stratification, comorbidities and participant's personal  goals.  Expected Outcomes Short Term: Attend rehab on a regular basis to increase amount of physical activity.;Long Term: Add in home exercise to make exercise part of routine and to increase amount of physical activity.;Long Term: Exercising regularly at least 3-5 days a week.       Increase Strength and Stamina Yes       Intervention Provide advice, education, support and counseling about physical activity/exercise needs.;Develop an individualized exercise prescription for aerobic and resistive training based on initial evaluation findings, risk stratification, comorbidities and participant's personal goals.       Expected Outcomes Short Term: Increase workloads from initial exercise prescription for resistance, speed, and METs.;Short Term: Perform resistance training exercises routinely during rehab and add in resistance training at home;Long Term: Improve cardiorespiratory fitness, muscular endurance and strength as measured by increased METs and functional capacity (6MWT)       Able to understand and use rate of perceived exertion (RPE) scale Yes       Intervention Provide education and explanation on how to use RPE scale       Expected Outcomes Short Term: Able to use RPE daily in rehab to express subjective intensity level;Long Term:  Able to use RPE to guide intensity level when exercising independently       Knowledge and understanding of Target Heart Rate Range (THRR) Yes       Intervention Provide education and explanation of THRR including how the numbers were predicted and where they are located for reference       Expected Outcomes Short Term: Able to state/look up THRR;Short Term: Able to use daily as guideline for intensity in rehab;Long Term: Able to use THRR to govern intensity when exercising independently       Understanding of Exercise Prescription Yes       Intervention Provide education, explanation, and written materials on patient's individual exercise prescription        Expected Outcomes Short Term: Able to explain program exercise prescription;Long Term: Able to explain home exercise prescription to exercise independently                Exercise Goals Re-Evaluation:  Exercise Goals Re-Evaluation     Row Name 04/29/21 1017 05/03/21 1034 05/15/21 1214 06/10/21 1248 06/21/21 1011     Exercise Goal Re-Evaluation   Exercise Goals Review Increase Physical Activity;Increase Strength and Stamina;Able to understand and use rate of perceived exertion (RPE) scale;Knowledge and understanding of Target Heart Rate Range (THRR);Understanding of Exercise Prescription Increase Physical Activity;Increase Strength and Stamina;Able to understand and use rate of perceived exertion (RPE) scale;Knowledge and understanding of Target Heart Rate Range (THRR);Able to check pulse independently;Understanding of Exercise Prescription Increase Strength and Stamina;Increase Physical Activity;Able to understand and use rate of perceived exertion (RPE) scale;Knowledge and understanding of Target Heart Rate Range (THRR);Understanding of Exercise Prescription Increase Physical Activity;Increase Strength and Stamina;Able to understand and use rate of perceived exertion (RPE) scale;Knowledge and understanding of Target Heart Rate Range (THRR);Able to check pulse independently;Understanding of Exercise Prescription Increase Physical Activity;Increase Strength and Stamina;Able to understand and use rate of perceived exertion (RPE) scale;Knowledge and understanding of Target Heart Rate Range (THRR);Understanding of Exercise Prescription;Able to check pulse independently   Comments Pt's first day in the CRP2 program. Pt understands the exercise Rx, THRR, and RPE scale. Reviewed home exercise Rx with patient today. Pt will use the Elliptical stepper 2-3 x/week at a gym near his home. Reviewed METs and goals with pt today. Pt tolerated exercise well with an  average MET level of 4.4. Pt feels he is working  well towards his goals. Pt states the CRP2 program is helping him gain strength. Pt also states he is having a hard time losing wt, but he has met with the RD and homes with exercise and diet change he can acieve his goals. Reviewed METs and goals with pt today. Pt tolerated exercise well with an average MET level of 5.3. Pt feels he is working well towards his goals. Pt voices having some issues with getting his exercise in at home but he has been swimming with his children at the pool. Encouraged patient to enjoy the time with his kids and resume his regualr exercise as time permits. Pt is very motivated to continue exericse. Pt graduated from the [facility] program today. Pt made excellent progress in the program and had an average MET level of 5.6, and a peak MET level of 6.7. Pt plans to continue to exercise at [employer].   Expected Outcomes Will continue to monitor and progress patients as tolerated. Pt will exercise 2-3 x/week agt the gym for 30-45 minutes. Will continue to monitor pt and progress workloads as tolerated without sign or symptom. Will continue to monitor pt and progress workloads as tolerated without sign or symptom. Pt will continue to exercise on his own at home/gym.            Nutrition & Weight - Outcomes:  Pre Biometrics - 04/25/21 0908       Pre Biometrics   % Body Fat --             Post Biometrics - 06/19/21 1016        Post  Biometrics   Height '5\' 9"'  (1.753 m)    Weight 104.3 kg    Waist Circumference 45 inches    Hip Circumference 45.5 inches    Waist to Hip Ratio 0.99 %    BMI (Calculated) 33.94    Triceps Skinfold 21 mm    % Body Fat 32.9 %    Grip Strength 48 kg    Flexibility 9.5 in    Single Leg Stand 30 seconds             Nutrition:  Nutrition Therapy & Goals - 05/01/21 1011       Nutrition Therapy   Diet TLC; carb modified      Personal Nutrition Goals   Nutrition Goal Pt to identify food quantities necessary to achieve weight  loss of 6-24 lb at graduation from cardiac rehab.    Personal Goal #2 Pt to build a healthy plate including vegetables, fruits, whole grains, and low-fat dairy products in a heart healthy meal plan.    Personal Goal #3 Pt to incorporate high fiber foods like beans to meet fiber needs of 30 g/day      Intervention Plan   Intervention Prescribe, educate and counsel regarding individualized specific dietary modifications aiming towards targeted core components such as weight, hypertension, lipid management, diabetes, heart failure and other comorbidities.;Nutrition handout(s) given to patient.    Expected Outcomes Short Term Goal: A plan has been developed with personal nutrition goals set during dietitian appointment.;Long Term Goal: Adherence to prescribed nutrition plan.             Nutrition Discharge:   Education Questionnaire Score:  Knowledge Questionnaire Score - 06/17/21 1000       Knowledge Questionnaire Score   Post Score 24/24  Goals reviewed with patient; copy given to patient.Pt graduated from cardiac rehab program on 06/21/21 with completion of 21 exercise sessions in Phase II. Pt maintained good attendance and progressed nicely during his participation in rehab as evidenced by increased MET level. Brad increased his distance on his post exercise walk test by 187 feet  Medication list reconciled. Repeat  PHQ score-0  .  Pt has made significant lifestyle changes and should be commended for his success. Pt feels he has achieved his goals during cardiac rehab.   Pt plans to continue exercise by attending [employer]. We are proud of Brad's progress.Barnet Pall, RN,BSN 07/03/2021 2:18 PM

## 2021-06-27 NOTE — Progress Notes (Signed)
Cardiology Office Note:    Date:  06/28/2021   ID:  Hunter Gomez, DOB [birth year], MRN [id number]  PCP:  Carlena Hurl, PA-C   [facility] Providers Cardiologist:  Sherren Mocha, MD Cardiology APP:  Sharmon Revere      Referring MD: Carlena Hurl, PA-C   Chief Complaint:  Follow-up (CAD)    Patient Profile:    Hunter Gomez is a 50 y.o. male with:  Coronary artery disease S/p NSTEMI in 2016 >> s/p CABG Myoview 5/21: low risk  S/p DES to [facility] 01/2021 Cath: LM 100, oLAD 28, RI 81, pLCx 29, dRCA 90, RPDA 49, RPAV 4; L-LAD ok, S-D1 ok; S-OM 100, S-RI 34, RIMA-RCA 100 Hx of gallstone pancreatitis s/p ERCP, cholecystectomy Hyperlipidemia Hypertension  Echocardiogram 2/16: EF 55-60, Gr 2 DD   Prior CV studies: LEFT HEART CATH 02/13/2021 Narrative 1.  Severe native vessel coronary artery disease with total occlusion of the left main and severe stenosis of the distal RCA 2.  Status post aortocoronary bypass surgery with continued patency of the LIMA to LAD and saphenous vein graft to diagonal.  The saphenous vein graft to diagonal supplies the ramus intermedius and native left circumflex in retrograde fashion.  Those vessels both have significant obstructive disease that is unapproachable because of left main total occlusion 3.  Total occlusion of the saphenous vein graft OM, saphenous vein graft to ramus intermedius, and free RIMA to RCA 4.  Successful PCI of the distal RCA, reducing 90% stenosis to 0% with a 2.5 x 22 mm resolute Onyx DES   Recommendations: Same-day PCI protocol if criteria met, ongoing aggressive medical therapy, dual antiplatelet therapy with aspirin and clopidogrel at least 6 months (favor long-term if tolerated in this young patient with bypass graft failure and extensive CAD)    Myoview 04/19/2020 EF 6, no ischemia or infarction, low risk   Echo 01/10/15 Mild LVH, EF 55-73%, grade 2 diastolic dysfunction, trivial AI, trivial TR   Carotid US  12/18/14 Bilateral ICA 1-39%   History of Present Illness: Hunter Gomez was last seen in 4/22.  He returns for f/u.  He is here alone.  He continues to do well without chest discomfort, shortness of breath, syncope, orthopnea, leg edema.  He just completed cardiac rehabilitation last week.  Of note, the nurse from cardiac rehab did reach out to me several weeks ago.  The patient scored low on his quality of life questionnaire.  He did try to follow-up with primary care.  With all of his appointments with cardiology and urology, he had not been seen in over 3 years.  When he tried to make an appointment he was notified that he had been dropped and they were no longer accepting new patients.  He will switch back from Medicaid to [employer] (insurance obtained via [employer]) at the beginning of the year.  He wants to wait until then to reestablish with primary care.        Past Medical History:  Diagnosis Date   CAD (coronary artery disease)    a.  NSTEMI (1/16):  LHC - dLM 95, oLAD 90, mLAD 30, oCFX 80-90, pCFX 50, oPDA 80, oPLA 75, EF 55-65% >> urgent CABG// Myoview 03/2020: EF 54, no ischemia or infarction; low risk   Hepatic steatosis    HLD (hyperlipidemia)    Hx of echocardiogram    Echo (2/16):  Mild LVH, EF 55-60%, Gr 2 DD, trivial AI/TR, no effusion   Hypertension 10/2012  Muscle weakness 1997   prior seizure like activity, but none since; prior neurology eval, prior normal EEG 1997   Obesity    s/p Lap Band surgery   Vision problem    prior use of glasses, not currently    Current Medications: Current Meds  Medication Sig   Alirocumab (PRALUENT) 150 MG/ML SOAJ Inject 1 pen into the skin every 14 (fourteen) days.   aspirin 81 MG tablet Take 81 mg by mouth daily.   Bempedoic Acid (NEXLETOL) 180 MG TABS Take 1 tablet by mouth daily.   clopidogrel (PLAVIX) 75 MG tablet Take 1 tablet (75 mg total) by mouth daily.   hydrochlorothiazide (HYDRODIURIL) 25 MG tablet Take 1 tablet (25 mg total) by mouth  daily.   isosorbide mononitrate (IMDUR) 30 MG 24 hr tablet Take 0.5 tablets (15 mg total) by mouth daily.   lisinopril (ZESTRIL) 20 MG tablet Take 1 tablet (20 mg total) by mouth daily.   metoprolol tartrate (LOPRESSOR) 50 MG tablet Take 1 tablet (50 mg total) by mouth 2 (two) times daily with a meal.   nitroGLYCERIN (NITROSTAT) 0.4 MG SL tablet Place 1 tablet (0.4 mg total) under the tongue every 5 (five) minutes as needed for chest pain.   Omega-3 Fatty Acids (FISH OIL) 1000 MG CAPS Take 2 capsules (2,000 mg total) by mouth in the morning and at bedtime.   tamsulosin (FLOMAX) 0.4 MG CAPS capsule TAKE 1 CAPSULE(0.4 MG) BY MOUTH DAILY AFTER BREAKFAST     Allergies:   Atorvastatin   Social History   Tobacco Use   Smoking status: Never   Smokeless tobacco: Never  Vaping Use   Vaping Use: Never used  Substance Use Topics   Alcohol use: No   Drug use: No     Family Hx: The patient's family history includes Cancer in his maternal grandfather and paternal grandmother; Diabetes in his mother; Heart disease (age of onset: 49) in his father; Heart disease (age of onset: 66) in his mother; Hyperlipidemia in his brother, father, and mother; Hypertension in his brother, father, and mother; Peripheral vascular disease in his mother; Stroke in his father. There is no history of Heart attack.  Review of Systems  Psychiatric/Behavioral:  The patient is nervous/anxious.     EKGs/Labs/Other Test Reviewed:    EKG:  EKG is not ordered today.  The ekg ordered today demonstrates N/A  Recent Labs: 02/01/2021: BUN 12; Creatinine, Ser 0.90; Hemoglobin 15.0; Platelets 373; Potassium 4.5; Sodium 139 05/08/2021: ALT 40   Recent Lipid Panel Lab Results  Component Value Date/Time   CHOL 107 05/08/2021 08:11 AM   TRIG 271 (H) 05/08/2021 08:11 AM   HDL 29 (L) 05/08/2021 08:11 AM   LDLCALC 37 05/08/2021 08:11 AM   LDLCALC 59 09/09/2017 11:04 AM      Risk Assessment/Calculations:      Physical Exam:     VS:  BP 120/64   Pulse (!) 59   Ht '5\' 9"'  (1.753 m)   Wt 231 lb (104.8 kg)   SpO2 97%   BMI 34.11 kg/m     Wt Readings from Last 3 Encounters:  06/28/21 231 lb (104.8 kg)  06/19/21 229 lb 15 oz (104.3 kg)  04/25/21 238 lb 5.1 oz (108.1 kg)     Constitutional:      Appearance: Healthy appearance. Not in distress.  Neck:     Vascular: JVD normal.  Pulmonary:     Effort: Pulmonary effort is normal.     Breath  sounds: No wheezing. No rales.  Cardiovascular:     Normal rate. Regular rhythm. Normal S1. Normal S2.      Murmurs: There is no murmur.  Edema:    Peripheral edema absent.  Abdominal:     Palpations: Abdomen is soft.  Skin:    General: Skin is warm and dry.  Neurological:     General: No focal deficit present.     Mental Status: Alert and oriented to person, place and time.     Cranial Nerves: Cranial nerves are intact.         ASSESSMENT & PLAN:    1. Coronary artery disease involving native coronary artery of native heart with angina pectoris ([place]) s/p non-STEMI in 2016 followed by CABG. Myoview in 5/21 low risk.    Cardiac catheterization in 3/22 demonstrated 2/5 grafts patent.  He underwent PCI with DES to the RCA.  He is doing well without anginal symptoms.  He just finished cardiac rehabilitation.  He notes that he scored low on his quality life questionnaire because he has anxiety related to his heart disease.  He realizes that he will always be anxious about his heart disease as he had a heart attack followed by bypass surgery at such a young age with minimal symptoms.  He will try to reestablish with primary care after the first of the year.  His medical regimen includes Alirocumab, aspirin, clopidogrel, bempedoic acid, isosorbide, lisinopril, metoprolol tartrate, fish oil.  Follow-up in March 2023.  2. Essential hypertension The patient's blood pressure is controlled on his current regimen.     3. Mixed hyperlipidemia Recent LDL optimal on current dose of  Alirocumab, bempedoic acid.  He continues on fish oil to manage high triglycerides.     Dispo:  Return in about 7 months (around 01/26/2022) for Routine Follow Up, w/ Dr. Burt Knack, or Richardson Dopp, PA-C.   Medication Adjustments/Labs and Tests Ordered: Current medicines are reviewed at length with the patient today.  Concerns regarding medicines are outlined above.  Tests Ordered: No orders of the defined types were placed in this encounter.  Medication Changes: No orders of the defined types were placed in this encounter.   Signed, Richardson Dopp, PA-C  06/28/2021 9:34 AM    [facility] [address], [place], [place]  [address] Phone: [phone]; Fax: [phone]

## 2021-06-28 ENCOUNTER — Other Ambulatory Visit: Payer: Self-pay

## 2021-06-28 ENCOUNTER — Ambulatory Visit (INDEPENDENT_AMBULATORY_CARE_PROVIDER_SITE_OTHER): Payer: Medicaid Other | Admitting: Physician Assistant

## 2021-06-28 ENCOUNTER — Telehealth: Payer: Self-pay | Admitting: Medical

## 2021-06-28 ENCOUNTER — Encounter: Payer: Self-pay | Admitting: Physician Assistant

## 2021-06-28 VITALS — BP 120/64 | HR 59 | Ht 69.0 in | Wt 231.0 lb

## 2021-06-28 DIAGNOSIS — I10 Essential (primary) hypertension: Secondary | ICD-10-CM

## 2021-06-28 DIAGNOSIS — E782 Mixed hyperlipidemia: Secondary | ICD-10-CM | POA: Diagnosis not present

## 2021-06-28 DIAGNOSIS — I25119 Atherosclerotic heart disease of native coronary artery with unspecified angina pectoris: Secondary | ICD-10-CM | POA: Diagnosis not present

## 2021-06-28 NOTE — Patient Instructions (Addendum)
Medication Instructions:   Your physician recommends that you continue on your current medications as directed. Please refer to the Current Medication list given to you today.  *If you need a refill on your cardiac medications before your next appointment, please call your pharmacy*   Lab Work:  -NONE  If you have labs (blood work) drawn today and your tests are completely normal, you will receive your results only by: MyChart Message (if you have MyChart) OR A paper copy in the mail If you have any lab test that is abnormal or we need to change your treatment, we will call you to review the results.   Testing/Procedures:  -NONE   Follow-Up: At [facility], you and your health needs are our priority.  As part of our continuing mission to provide you with exceptional heart care, we have created designated Provider Care Teams.  These Care Teams include your primary Cardiologist (physician) and Advanced Practice Providers (APPs -  Physician Assistants and Nurse Practitioners) who all work together to provide you with the care you need, when you need it.  We recommend signing up for the patient portal called "MyChart".  Sign up information is provided on this After Visit Summary.  MyChart is used to connect with patients for Virtual Visits (Telemedicine).  Patients are able to view lab/test results, encounter notes, upcoming appointments, etc.  Non-urgent messages can be sent to your provider as well.   To learn more about what you can do with MyChart, go to [email or web address].    Your next appointment:   7 month(s) with Dr.Cooper on Monday, March 20 @ 9:00 am.   The format for your next appointment:   In Person  Provider:   You may see Tonny Bollman, MD or one of the following Advanced Practice Providers on your designated Care Team:       Other Instructions -None

## 2021-06-28 NOTE — Telephone Encounter (Signed)
Please call patient.    I am listed as his PCP although he has not been here since 2019.  He continues to see cardiology regularly thankfully but he really needs to be seeing PCP at least yearly.  So remind him to either go ahead and make an appointment for physical or have him establish with another PCP elsewhere.    I want him removed off my list if he is not coming back

## 2021-07-02 ENCOUNTER — Telehealth: Payer: Self-pay | Admitting: Family Medicine

## 2021-07-02 NOTE — Telephone Encounter (Signed)
Patient called this morning wanting to come in, he said he was told he could not be seen due to medicaid. Verified that we take his Medicaid and we do. Vincenza Hews had already asked that he come in. Per Efraim Kaufmann it will be best for him to have a MED CHECK 1st.  Pt will have new insurance 1st of year, [employer] or [employer].

## 2021-07-02 NOTE — Telephone Encounter (Signed)
Called pt reached voice mail, lmtrc.  Pt needs CPE fasting labs.

## 2021-07-04 ENCOUNTER — Ambulatory Visit (INDEPENDENT_AMBULATORY_CARE_PROVIDER_SITE_OTHER): Payer: Medicaid Other | Admitting: Medical

## 2021-07-04 ENCOUNTER — Other Ambulatory Visit: Payer: Self-pay

## 2021-07-04 ENCOUNTER — Encounter: Payer: Self-pay | Admitting: Medical

## 2021-07-04 VITALS — BP 120/80 | HR 50 | Ht 69.0 in | Wt 231.0 lb

## 2021-07-04 DIAGNOSIS — Z8249 Family history of ischemic heart disease and other diseases of the circulatory system: Secondary | ICD-10-CM | POA: Diagnosis not present

## 2021-07-04 DIAGNOSIS — I252 Old myocardial infarction: Secondary | ICD-10-CM | POA: Diagnosis not present

## 2021-07-04 DIAGNOSIS — Z1211 Encounter for screening for malignant neoplasm of colon: Secondary | ICD-10-CM

## 2021-07-04 DIAGNOSIS — I10 Essential (primary) hypertension: Secondary | ICD-10-CM

## 2021-07-04 DIAGNOSIS — K760 Fatty (change of) liver, not elsewhere classified: Secondary | ICD-10-CM

## 2021-07-04 DIAGNOSIS — Z6834 Body mass index (BMI) 34.0-34.9, adult: Secondary | ICD-10-CM | POA: Insufficient documentation

## 2021-07-04 DIAGNOSIS — Z951 Presence of aortocoronary bypass graft: Secondary | ICD-10-CM

## 2021-07-04 DIAGNOSIS — R7301 Impaired fasting glucose: Secondary | ICD-10-CM | POA: Diagnosis not present

## 2021-07-04 DIAGNOSIS — N132 Hydronephrosis with renal and ureteral calculous obstruction: Secondary | ICD-10-CM

## 2021-07-04 DIAGNOSIS — Z0000 Encounter for general adult medical examination without abnormal findings: Secondary | ICD-10-CM | POA: Diagnosis not present

## 2021-07-04 DIAGNOSIS — Z87442 Personal history of urinary calculi: Secondary | ICD-10-CM

## 2021-07-04 DIAGNOSIS — Z125 Encounter for screening for malignant neoplasm of prostate: Secondary | ICD-10-CM

## 2021-07-04 DIAGNOSIS — I25119 Atherosclerotic heart disease of native coronary artery with unspecified angina pectoris: Secondary | ICD-10-CM | POA: Diagnosis not present

## 2021-07-04 LAB — POCT CBG (FASTING - GLUCOSE)-MANUAL ENTRY: Glucose Fasting, POC: 106 mg/dL — AB (ref 70–99)

## 2021-07-04 NOTE — Patient Instructions (Signed)
This visit was a preventative care visit, also known as wellness visit or routine physical.   Topics typically include healthy lifestyle, diet, exercise, preventative care, vaccinations, sick and well care, proper use of emergency dept and after hours care, as well as other concerns.     Recommendations: Continue to return yearly for your annual wellness and preventative care visits.  This gives [place] a chance to discuss healthy lifestyle, exercise, vaccinations, review your chart record, and perform screenings where appropriate.  I recommend you see your eye doctor yearly for routine vision care.  I recommend you see your dentist yearly for routine dental care including hygiene visits twice yearly.   Vaccination recommendations were reviewed Immunization History  Administered Date(s) Administered   Influenza,inj,Quad PF,6+ Mos 09/09/2017   PFIZER(Purple Top)SARS-COV-2 Vaccination 02/02/2020, 02/23/2020, 10/25/2020   Pneumococcal Polysaccharide-23 09/09/2017   Tdap 10/25/2012   Advised yearly flu vaccine.  You will be due for repeat tetanus and pneumococcal vaccine next year 2023  You will be due for shingles vaccine next year    Screening for cancer: Colon cancer screening: Consider Cologuard versus colonoscopy.  Please let me know which 1 you want to do  Please call your insurance company to check coverage for colon cancer screening.  Options may include Cologard stool test or Colonoscopy.  You should also inquire about which facility the colonoscopy could be performed, and coverage for diagnostic vs screening colonoscopy as coverage may vary.  If you have significant family history of colon cancer or blood in the stool, then you should only do the colonoscopy, not the cologard test.   Testicular cancer screening You should do a monthly self testicular exam if you are between 66-54 years old  We discussed PSA, prostate exam, and prostate cancer screening risks/benefits.   You are  currently seeing urology yearly and you noted a plan to do a PSA and prostate screen next visit.  You declined PSA lab today.  Skin cancer screening: Check your skin regularly for new changes, growing lesions, or other lesions of concern Come in for evaluation if you have skin lesions of concern.  Lung cancer screening: If you have a greater than 20 pack year history of tobacco use, then you may qualify for lung cancer screening with a chest CT scan.   Please call your insurance company to inquire about coverage for this test.  We currently don't have screenings for other cancers besides breast, cervical, colon, and lung cancers.  If you have a strong family history of cancer or have other cancer screening concerns, please let me know.    Bone health: Get at least 150 minutes of aerobic exercise weekly Get weight bearing exercise at least once weekly Bone density test:  A bone density test is an imaging test that uses a type of X-ray to measure the amount of calcium and other minerals in your bones. The test may be used to diagnose or screen you for a condition that causes weak or thin bones (osteoporosis), predict your risk for a broken bone (fracture), or determine how well your osteoporosis treatment is working. The bone density test is recommended for females 65 and older, or females or males <65 if certain risk factors such as thyroid disease, long term use of steroids such as for asthma or rheumatological issues, vitamin D deficiency, estrogen deficiency, family history of osteoporosis, self or family history of fragility fracture in first degree relative.    Heart health: Get at least 150 minutes of aerobic  exercise weekly Limit alcohol It is important to maintain a healthy blood pressure and healthy cholesterol numbers  Heart disease screening: Screening for heart disease includes screening for blood pressure, fasting lipids, glucose/diabetes screening, BMI height to weight ratio,  reviewed of smoking status, physical activity, and diet.    Goals include blood pressure 120/80 or less, maintaining a healthy lipid/cholesterol profile, preventing diabetes or keeping diabetes numbers under good control, not smoking or using tobacco products, exercising most days per week or at least 150 minutes per week of exercise, and eating healthy variety of fruits and vegetables, healthy oils, and avoiding unhealthy food choices like fried food, fast food, high sugar and high cholesterol foods.    Continue routine follow-up with cardiology    Medical care options: I recommend you continue to seek care here first for routine care.  We try really hard to have available appointments Monday through Friday daytime hours for sick visits, acute visits, and physicals.  Urgent care should be used for after hours and weekends for significant issues that cannot wait till the next day.  The emergency department should be used for significant potentially life-threatening emergencies.  The emergency department is expensive, can often have long wait times for less significant concerns, so try to utilize primary care, urgent care, or telemedicine when possible to avoid unnecessary trips to the emergency department.  Virtual visits and telemedicine have been introduced since the pandemic started in 2020, and can be convenient ways to receive medical care.  We offer virtual appointments as well to assist you in a variety of options to seek medical care.   Advanced Directives: I recommend you consider completing a Health Care Power of Attorney and Living Will.   These documents respect your wishes and help alleviate burdens on your loved ones if you were to become terminally ill or be in a position to need those documents enforced.    You can complete Advanced Directives yourself, have them notarized, then have copies made for our office, for you and for anybody you feel should have them in safe keeping.  Or,  you can have an attorney prepare these documents.   If you haven't updated your Last Will and Testament in a while, it may be worthwhile having an attorney prepare these documents together and save on some costs.       Separate significant issues discussed: I reviewed lab work he has had done in recent months including basic metabolic panel, hepatic panel, lipid, hemoglobin A1c.  We discussed those findings which are most notable for abnormal lipids  History of NSTEMI, history of CABG, recent stenting, coronary artery disease-continue routine follow-up with cardiology.  Of note he just finished cardiac rehab  Dyslipidemia-intolerant to statins.  Currently being managed by cardiology.  He will have a repeat fasting lipid panel in a few months given the recent add on medication  Impaired glucose-recent hemoglobin A1c normal, fasting glucose slightly elevated today.  Counseled on healthy low sugar low-fat diet and regular exercise.  Plan to repeat glucose again in 6 months  Hepatic steatosis on ultrasound in 2018 but recent CT scan within the last 2 years did not show fatty liver.  I recommended weight loss, healthy low-fat diet and possibly recheck ultrasound within the next 1 to 2 years  History of renal stone-sees urology yearly, no recent complaint  Obesity-we discussed the need to lose weight through healthy diet and exercise.

## 2021-07-04 NOTE — Progress Notes (Signed)
Subjective:   HPI  Hunter Gomez is a 50 y.o. male who presents for Chief Complaint  Patient presents with   fasting cpe    Fasting cpe. Had surgery back in march. No other concerns    Patient Care Team: Nur Rabold, Kermit Balo, PA-C as PCP - General (Family Medicine) Tonny Bollman, MD as PCP - Cardiology (Cardiology) Tonny Bollman, MD as Consulting Physician (Cardiology) Kennon Rounds as Physician Assistant (Cardiology) Sees dentist Sees eye doctor [facility]  Concerns: Here for physical.  Last visit more than a year ago.  He noted that since he was seen cardiology and urology yearly or regularly he did not feel that he needed to come in.  He was advised by cardiology to come in and discuss glucose from recent labs.  He has intolerance to statins.  He recently had some additional medicine added by cardiology to get the lipids down.  Although he has history of NSTEMI and CABG, he had to have stents put in some of his bypass grafts in March  He does not smoke.  He just completed cardiac rehab.  He sees urology yearly for routine follow-up on prior kidney stones.  He has no particular concerns today  Reviewed their medical, surgical, family, social, medication, and allergy history and updated chart as appropriate.  Past Medical History:  Diagnosis Date   CAD (coronary artery disease)    a.  NSTEMI (1/16):  LHC - dLM 95, oLAD 90, mLAD 30, oCFX 80-90, pCFX 50, oPDA 80, oPLA 75, EF 55-65% >> urgent CABG// Myoview 03/2020: EF 54, no ischemia or infarction; low risk   Hepatic steatosis    HLD (hyperlipidemia)    Hx of echocardiogram    Echo (2/16):  Mild LVH, EF 55-60%, Gr 2 DD, trivial AI/TR, no effusion   Hypertension 10/2012   Muscle weakness 1997   prior seizure like activity, but none since; prior neurology eval, prior normal EEG 1997   Obesity    s/p Lap Band surgery   Vision problem    prior use of glasses, not currently    Past Surgical History:   Procedure Laterality Date   CARDIAC CATHETERIZATION     CHOLECYSTECTOMY N/A 12/31/2016   Procedure: LAPAROSCOPIC CHOLECYSTECTOMY;  Surgeon: Harriette Bouillon, MD;  Location: [facility] OR;  Service: General;  Laterality: N/A;   CORONARY ARTERY BYPASS GRAFT N/A 12/18/2014   Procedure: CORONARY ARTERY BYPASS GRAFTING (CABG), ON PUMP, TIMES FIVE, USING BILATERAL INTERNAL MAMMARY ARTERIES, RIGHT GREATER SAPHENOUS VEIN HARVESTED ENDOSCOPICALLY;  Surgeon: Kerin Perna, MD;  Location: [facility] OR;  Service: Open Heart Surgery;  Laterality: N/A;  LIMA-LAD, SVG-D, SVG-OM, Free RIMA-Ramus, SVG-PD   CORONARY STENT INTERVENTION N/A 02/13/2021   Procedure: CORONARY STENT INTERVENTION;  Surgeon: Tonny Bollman, MD;  Location: [facility] INVASIVE CV LAB;  Service: Cardiovascular;  Laterality: N/A;   ENDOSCOPIC RETROGRADE CHOLANGIOPANCREATOGRAPHY (ERCP) WITH PROPOFOL N/A 12/29/2016   Procedure: ENDOSCOPIC RETROGRADE CHOLANGIOPANCREATOGRAPHY (ERCP) WITH PROPOFOL;  Surgeon: Rachael Fee, MD;  Location: [facility] ENDOSCOPY;  Service: Endoscopy;  Laterality: N/A;   INTRAOPERATIVE TRANSESOPHAGEAL ECHOCARDIOGRAM N/A 12/18/2014   Procedure: INTRAOPERATIVE TRANSESOPHAGEAL ECHOCARDIOGRAM;  Surgeon: Kerin Perna, MD;  Location: [facility] OR;  Service: Open Heart Surgery;  Laterality: N/A;   LAPAROSCOPIC GASTRIC BANDING  2007   initial procedure Dr. Robb Matar in [place], adjustments in [place], [place]   LAPAROSCOPIC GASTRIC BANDING  2006   LEFT HEART CATH AND CORS/GRAFTS ANGIOGRAPHY N/A 02/13/2021   Procedure: LEFT HEART CATH AND CORS/GRAFTS ANGIOGRAPHY;  Surgeon:  Tonny Bollman, MD;  Location: [facility] INVASIVE CV LAB;  Service: Cardiovascular;  Laterality: N/A;   LEFT HEART CATHETERIZATION WITH CORONARY ANGIOGRAM N/A 12/18/2014   Procedure: LEFT HEART CATHETERIZATION WITH CORONARY ANGIOGRAM;  Surgeon: Micheline Chapman, MD;  Location: [facility] CATH LAB;  Service: Cardiovascular;  Laterality: N/A;   TONSILLECTOMY     WISDOM TOOTH EXTRACTION      Family History   Problem Relation Age of Onset   Cancer Mother        lung   Diabetes Mother    Hypertension Mother    Hyperlipidemia Mother    Heart disease Mother 82       CABG   Peripheral vascular disease Mother    Hypertension Father    Hyperlipidemia Father    Heart disease Father 56       CABG   Stroke Father    Hypertension Brother    Hyperlipidemia Brother    Cancer Maternal Grandfather        stomach   Cancer Paternal Grandmother    Heart attack Neg Hx      Current Outpatient Medications:    Alirocumab (PRALUENT) 150 MG/ML SOAJ, Inject 1 pen into the skin every 14 (fourteen) days., Disp: 2 mL, Rfl: 11   aspirin 81 MG tablet, Take 81 mg by mouth daily., Disp: , Rfl:    Bempedoic Acid (NEXLETOL) 180 MG TABS, Take 1 tablet by mouth daily., Disp: 90 tablet, Rfl: 3   clopidogrel (PLAVIX) 75 MG tablet, Take 1 tablet (75 mg total) by mouth daily., Disp: 90 tablet, Rfl: 1   hydrochlorothiazide (HYDRODIURIL) 25 MG tablet, Take 1 tablet (25 mg total) by mouth daily., Disp: 30 tablet, Rfl: 1   isosorbide mononitrate (IMDUR) 30 MG 24 hr tablet, Take 0.5 tablets (15 mg total) by mouth daily., Disp: 15 tablet, Rfl: 3   lisinopril (ZESTRIL) 20 MG tablet, Take 1 tablet (20 mg total) by mouth daily., Disp: 90 tablet, Rfl: 2   metoprolol tartrate (LOPRESSOR) 50 MG tablet, Take 1 tablet (50 mg total) by mouth 2 (two) times daily with a meal., Disp: 180 tablet, Rfl: 3   nitroGLYCERIN (NITROSTAT) 0.4 MG SL tablet, Place 1 tablet (0.4 mg total) under the tongue every 5 (five) minutes as needed for chest pain., Disp: 30 tablet, Rfl: 6   Omega-3 Fatty Acids (FISH OIL) 1000 MG CAPS, Take 2 capsules (2,000 mg total) by mouth in the morning and at bedtime., Disp: 360 capsule, Rfl: 3   tamsulosin (FLOMAX) 0.4 MG CAPS capsule, TAKE 1 CAPSULE(0.4 MG) BY MOUTH DAILY AFTER BREAKFAST, Disp: 30 capsule, Rfl: 1  Allergies  Allergen Reactions   Atorvastatin Diarrhea    Rosuvastatin also      Review of  Systems Constitutional: -fever, -chills, -sweats, -unexpected weight change, -decreased appetite, -fatigue Allergy: -sneezing, -itching, -congestion Dermatology: -changing moles, --rash, -lumps ENT: -runny nose, -ear pain, -sore throat, -hoarseness, -sinus pain, -teeth pain, - ringing in ears, -hearing loss, -nosebleeds Cardiology: -chest pain, -palpitations, -swelling, -difficulty breathing when lying flat, -waking up short of breath Respiratory: -cough, -shortness of breath, -difficulty breathing with exercise or exertion, -wheezing, -coughing up blood Gastroenterology: -abdominal pain, -nausea, -vomiting, -diarrhea, -constipation, -blood in stool, -changes in bowel movement, -difficulty swallowing or eating Hematology: -bleeding, -bruising  Musculoskeletal: -joint aches, -muscle aches, -joint swelling, -back pain, -neck pain, -cramping, -changes in gait Ophthalmology: denies vision changes, eye redness, itching, discharge Urology: -burning with urination, -difficulty urinating, -blood in urine, -urinary frequency, -urgency, -incontinence Neurology: -headache, -weakness, -  tingling, -numbness, -memory loss, -falls, -dizziness Psychology: -depressed mood, -agitation, -sleep problems Male GU: no testicular mass, pain, no lymph nodes swollen, no swelling, no rash.  [facility] 07/04/2021 06/21/2021 04/25/2021 09/09/2017  Decreased Interest 0 0 0 0  Down, Depressed, Hopeless 0 0 0 0  PHQ - 2 Score 0 0 0 0        Objective:  BP 120/80   Pulse (!) 50   Ht 5\' 9"  (1.753 m)   Wt 231 lb (104.8 kg)   BMI 34.11 kg/m   General appearance: alert, no distress, WD/WN, Caucasian male Skin: unremarkable HEENT: normocephalic, conjunctiva/corneas normal, sclerae anicteric, PERRLA, EOMi, nares patent, no discharge or erythema, pharynx normal Oral cavity: MMM, tongue normal, teeth normal Neck: supple, no lymphadenopathy, no thyromegaly, no masses, normal ROM, no bruits Chest: vertical  surgical scars, s/p cABGnon tender, normal shape and expansion Heart: RRR, normal S1, S2, no murmurs Lungs: CTA bilaterally, no wheezes, rhonchi, or rales Abdomen: +bs, soft, non tender, non distended, no masses, no hepatomegaly, no splenomegaly, no bruits Back: non tender, normal ROM, no scoliosis Musculoskeletal: upper extremities non tender, no obvious deformity, normal ROM throughout, lower extremities non tender, no obvious deformity, normal ROM throughout Extremities: no edema, no cyanosis, no clubbing Pulses: 2+ symmetric, upper and lower extremities, normal cap refill Neurological: alert, oriented x 3, CN2-12 intact, strength normal upper extremities and lower extremities, sensation normal throughout, DTRs 2+ throughout, no cerebellar signs, gait normal Psychiatric: normal affect, behavior normal, pleasant  GU/rectal - deferred to urology    Assessment and Plan :   Encounter Diagnoses  Name Primary?   Encounter for health maintenance examination in adult Yes   Coronary artery disease involving native coronary artery of native heart with angina pectoris (HCC)    Essential hypertension, benign    Family history of peripheral arterial disease    Hepatic steatosis    History of renal stone    Hx of NSTEMI followed by CABG in 2016    S/P CABG x 5    Ureteral stone with hydronephrosis    BMI 34.0-34.9,adult    Impaired fasting blood sugar    Screen for colon cancer    Screening for prostate cancer     This visit was a preventative care visit, also known as wellness visit or routine physical.   Topics typically include healthy lifestyle, diet, exercise, preventative care, vaccinations, sick and well care, proper use of emergency dept and after hours care, as well as other concerns.     Recommendations: Continue to return yearly for your annual wellness and preventative care visits.  This gives us a chance to discuss healthy lifestyle, exercise, vaccinations, review your chart  record, and perform screenings where appropriate.  I recommend you see your eye doctor yearly for routine vision care.  I recommend you see your dentist yearly for routine dental care including hygiene visits twice yearly.   Vaccination recommendations were reviewed Immunization History  Administered Date(s) Administered   Influenza,inj,Quad PF,6+ Mos 09/09/2017   PFIZER(Purple Top)SARS-COV-2 Vaccination 02/02/2020, 02/23/2020, 10/25/2020   Pneumococcal Polysaccharide-23 09/09/2017   Tdap 10/25/2012   Advised yearly flu vaccine.  You will be due for repeat tetanus and pneumococcal vaccine next year 2023  You will be due for shingles vaccine next year    Screening for cancer: Colon cancer screening: Consider Cologuard versus colonoscopy.  Please let me know which 1 you want to do  Please call your insurance company to check coverage for  colon cancer screening.  Options may include Cologard stool test or Colonoscopy.  You should also inquire about which facility the colonoscopy could be performed, and coverage for diagnostic vs screening colonoscopy as coverage may vary.  If you have significant family history of colon cancer or blood in the stool, then you should only do the colonoscopy, not the cologard test.   Testicular cancer screening You should do a monthly self testicular exam if you are between 8-19 years old  We discussed PSA, prostate exam, and prostate cancer screening risks/benefits.   You are currently seeing urology yearly and you noted a plan to do a PSA and prostate screen next visit.  You declined PSA lab today.  Skin cancer screening: Check your skin regularly for new changes, growing lesions, or other lesions of concern Come in for evaluation if you have skin lesions of concern.  Lung cancer screening: If you have a greater than 20 pack year history of tobacco use, then you may qualify for lung cancer screening with a chest CT scan.   Please call your  insurance company to inquire about coverage for this test.  We currently don't have screenings for other cancers besides breast, cervical, colon, and lung cancers.  If you have a strong family history of cancer or have other cancer screening concerns, please let me know.    Bone health: Get at least 150 minutes of aerobic exercise weekly Get weight bearing exercise at least once weekly Bone density test:  A bone density test is an imaging test that uses a type of X-ray to measure the amount of calcium and other minerals in your bones. The test may be used to diagnose or screen you for a condition that causes weak or thin bones (osteoporosis), predict your risk for a broken bone (fracture), or determine how well your osteoporosis treatment is working. The bone density test is recommended for females 65 and older, or females or males <65 if certain risk factors such as thyroid disease, long term use of steroids such as for asthma or rheumatological issues, vitamin D deficiency, estrogen deficiency, family history of osteoporosis, self or family history of fragility fracture in first degree relative.    Heart health: Get at least 150 minutes of aerobic exercise weekly Limit alcohol It is important to maintain a healthy blood pressure and healthy cholesterol numbers  Heart disease screening: Screening for heart disease includes screening for blood pressure, fasting lipids, glucose/diabetes screening, BMI height to weight ratio, reviewed of smoking status, physical activity, and diet.    Goals include blood pressure 120/80 or less, maintaining a healthy lipid/cholesterol profile, preventing diabetes or keeping diabetes numbers under good control, not smoking or using tobacco products, exercising most days per week or at least 150 minutes per week of exercise, and eating healthy variety of fruits and vegetables, healthy oils, and avoiding unhealthy food choices like fried food, fast food, high sugar  and high cholesterol foods.    Continue routine follow-up with cardiology    Medical care options: I recommend you continue to seek care here first for routine care.  We try really hard to have available appointments Monday through Friday daytime hours for sick visits, acute visits, and physicals.  Urgent care should be used for after hours and weekends for significant issues that cannot wait till the next day.  The emergency department should be used for significant potentially life-threatening emergencies.  The emergency department is expensive, can often have long wait times for less  significant concerns, so try to utilize primary care, urgent care, or telemedicine when possible to avoid unnecessary trips to the emergency department.  Virtual visits and telemedicine have been introduced since the pandemic started in 2020, and can be convenient ways to receive medical care.  We offer virtual appointments as well to assist you in a variety of options to seek medical care.   Advanced Directives: I recommend you consider completing a Health Care Power of Attorney and Living Will.   These documents respect your wishes and help alleviate burdens on your loved ones if you were to become terminally ill or be in a position to need those documents enforced.    You can complete Advanced Directives yourself, have them notarized, then have copies made for our office, for you and for anybody you feel should have them in safe keeping.  Or, you can have an attorney prepare these documents.   If you haven't updated your Last Will and Testament in a while, it may be worthwhile having an attorney prepare these documents together and save on some costs.       Separate significant issues discussed: I reviewed lab work he has had done in recent months including basic metabolic panel, hepatic panel, lipid, hemoglobin A1c.  We discussed those findings which are most notable for abnormal lipids  History of NSTEMI,  history of CABG, recent stenting, coronary artery disease-continue routine follow-up with cardiology.  Of note he just finished cardiac rehab  Dyslipidemia-intolerant to statins.  Currently being managed by cardiology.  He will have a repeat fasting lipid panel in a few months given the recent add on medication  Impaired glucose-recent hemoglobin A1c normal, fasting glucose slightly elevated today.  Counseled on healthy low sugar low-fat diet and regular exercise.  Plan to repeat glucose again in 6 months  Hepatic steatosis on ultrasound in 2018 but recent CT scan within the last 2 years did not show fatty liver.  I recommended weight loss, healthy low-fat diet and possibly recheck ultrasound within the next 1 to 2 years  History of renal stone-sees urology yearly, no recent complaint  Obesity-we discussed the need to lose weight through healthy diet and exercise.   Jashawn was seen today for fasting cpe.  Diagnoses and all orders for this visit:  Encounter for health maintenance examination in adult -     Glucose (CBG), Fasting  Coronary artery disease involving native coronary artery of native heart with angina pectoris (HCC)  Essential hypertension, benign  Family history of peripheral arterial disease  Hepatic steatosis  History of renal stone  Hx of NSTEMI followed by CABG in 2016  S/P CABG x 5  Ureteral stone with hydronephrosis  BMI 34.0-34.9,adult  Impaired fasting blood sugar -     Glucose (CBG), Fasting  Screen for colon cancer  Screening for prostate cancer   Follow-up pending labs, yearly for physical

## 2021-07-08 DIAGNOSIS — H5213 Myopia, bilateral: Secondary | ICD-10-CM | POA: Diagnosis not present

## 2021-08-06 ENCOUNTER — Other Ambulatory Visit: Payer: Self-pay | Admitting: Physician Assistant

## 2021-09-19 ENCOUNTER — Other Ambulatory Visit: Payer: Self-pay | Admitting: Cardiovascular Disease

## 2021-09-23 ENCOUNTER — Other Ambulatory Visit: Payer: Self-pay | Admitting: Physician Assistant

## 2021-11-20 ENCOUNTER — Other Ambulatory Visit: Payer: Self-pay | Admitting: Physician Assistant

## 2021-11-30 ENCOUNTER — Other Ambulatory Visit: Payer: Self-pay | Admitting: Physician Assistant

## 2022-01-01 ENCOUNTER — Other Ambulatory Visit: Payer: Self-pay

## 2022-01-01 ENCOUNTER — Ambulatory Visit: Payer: Medicaid Other | Admitting: Medical

## 2022-01-01 VITALS — BP 118/76 | HR 61 | Temp 98.3°F | Wt 229.0 lb

## 2022-01-01 DIAGNOSIS — Z951 Presence of aortocoronary bypass graft: Secondary | ICD-10-CM | POA: Diagnosis not present

## 2022-01-01 DIAGNOSIS — E785 Hyperlipidemia, unspecified: Secondary | ICD-10-CM

## 2022-01-01 DIAGNOSIS — K760 Fatty (change of) liver, not elsewhere classified: Secondary | ICD-10-CM

## 2022-01-01 DIAGNOSIS — I10 Essential (primary) hypertension: Secondary | ICD-10-CM

## 2022-01-01 DIAGNOSIS — R7301 Impaired fasting glucose: Secondary | ICD-10-CM

## 2022-01-01 DIAGNOSIS — I25119 Atherosclerotic heart disease of native coronary artery with unspecified angina pectoris: Secondary | ICD-10-CM | POA: Diagnosis not present

## 2022-01-01 DIAGNOSIS — Z1211 Encounter for screening for malignant neoplasm of colon: Secondary | ICD-10-CM

## 2022-01-01 MED ORDER — ISOSORBIDE MONONITRATE ER 30 MG PO TB24: 15.0000 mg | ORAL_TABLET | Freq: Every day | ORAL | 0 refills | Status: DC

## 2022-01-01 MED ORDER — METOPROLOL TARTRATE 50 MG PO TABS: 50.0000 mg | ORAL_TABLET | Freq: Two times a day (BID) | ORAL | 0 refills | Status: DC

## 2022-01-01 MED ORDER — NEXLETOL 180 MG PO TABS: 1.0000 | ORAL_TABLET | Freq: Every day | ORAL | 0 refills | Status: DC

## 2022-01-01 MED ORDER — TAMSULOSIN HCL 0.4 MG PO CAPS: ORAL_CAPSULE | ORAL | 3 refills | Status: DC

## 2022-01-01 NOTE — Progress Notes (Signed)
Subjective: Chief Complaint  Patient presents with   med check    Wants to know which medications can be filled here vs cardiology. Declines flu shot. Would like to discuss colonoscopy.    Patient Care Team: Cordae Mccarey, Kermit Balo, PA-C as PCP - General (Family Medicine) Tonny Bollman, MD as PCP - Cardiology (Cardiology) Tonny Bollman, MD as Consulting Physician (Cardiology) Kennon Rounds as Physician Assistant (Cardiology) Sees dentist Sees eye doctor [facility]   Here for recheck on impaired glucose.  Nonfasting today.  No recent symptoms of concern  Here for recheck on dyslipidemia.  He has a history of elevated triglycerides, low HDL.  He is compliant with Praluent.  Over-the-counter fish oil was started back in December 2022.  He is also on Nexletol per cardiology.  He is statin intolerant.  He wants to discuss colon cancer screening  He is running out of medications and needs refills.  He was not sure who to ask for refills  No other aggravating or relieving factors. No other complaint.  Past Medical History:  Diagnosis Date   CAD (coronary artery disease)    a.  NSTEMI (1/16):  LHC - dLM 95, oLAD 90, mLAD 30, oCFX 80-90, pCFX 50, oPDA 80, oPLA 75, EF 55-65% >> urgent CABG// Myoview 03/2020: EF 54, no ischemia or infarction; low risk   Hepatic steatosis    HLD (hyperlipidemia)    Hx of echocardiogram    Echo (2/16):  Mild LVH, EF 55-60%, Gr 2 DD, trivial AI/TR, no effusion   Hypertension 10/2012   Muscle weakness 1997   prior seizure like activity, but none since; prior neurology eval, prior normal EEG 1997   Obesity    s/p Lap Band surgery   Vision problem    prior use of glasses, not currently   Current Outpatient Medications on File Prior to Visit  Medication Sig Dispense Refill   aspirin 81 MG tablet Take 81 mg by mouth daily.     clopidogrel (PLAVIX) 75 MG tablet TAKE ONE TABLET BY MOUTH DAILY 90 tablet 2   hydrochlorothiazide (HYDRODIURIL) 25 MG  tablet TAKE ONE TABLET BY MOUTH DAILY 30 tablet 11   lisinopril (ZESTRIL) 20 MG tablet TAKE ONE TABLET BY MOUTH DAILY 90 tablet 2   nitroGLYCERIN (NITROSTAT) 0.4 MG SL tablet Place 1 tablet (0.4 mg total) under the tongue every 5 (five) minutes as needed for chest pain. 30 tablet 6   Omega-3 Fatty Acids (FISH OIL) 1000 MG CAPS Take 2 capsules (2,000 mg total) by mouth in the morning and at bedtime. 360 capsule 3   PRALUENT 150 MG/ML SOAJ INJECT THE CONTENTS OF 1 PEN INTO THE SKIN EVERY 14 DAYS 2 mL 11   No current facility-administered medications on file prior to visit.    ROS as in subjective   Objective: BP 118/76    Pulse 61    Temp 98.3 F (36.8 C)    Wt 229 lb (103.9 kg)    BMI 33.82 kg/m   Gen: Well-developed, well-nourished, no acute stress   Assessment: Encounter Diagnoses  Name Primary?   Dyslipidemia Yes   S/P CABG x 5    Coronary artery disease involving native coronary artery of native heart with angina pectoris (HCC)    Impaired fasting blood sugar    Hepatic steatosis    Essential hypertension, benign      Plan: CAD, s/p CABG , dyslipidemia -he continues on Praluent, Nexletol and over-the-counter fish oil.  Return this  week fasting for updated labs since starting the fish oil  Impaired glucose-return for fasting labs for diabetes screen  Screening for colon cancer We will check with the representative for [employer] on insurance coverage for Cologuard  Vaccine counseling: Advised tetanus update, pneumococcal vaccine, shingles.  He seems agreeable to tetanus and pneumonia but not sure about insurance coverage.  We will check with our insurance rep and get back in touch with him.   Ronnell was seen today for med check.  Diagnoses and all orders for this visit:  Dyslipidemia  S/P CABG x 5 -     Lipid panel; Future -     Hepatic function panel; Future  Coronary artery disease involving native coronary artery of native heart with angina pectoris  (HCC) -     Lipid panel; Future -     Hepatic function panel; Future  Impaired fasting blood sugar -     Hemoglobin A1c; Future  Hepatic steatosis  Essential hypertension, benign  Other orders -     Bempedoic Acid (NEXLETOL) 180 MG TABS; Take 1 tablet by mouth daily. -     tamsulosin (FLOMAX) 0.4 MG CAPS capsule; TAKE 1 CAPSULE(0.4 MG) BY MOUTH DAILY AFTER BREAKFAST -     isosorbide mononitrate (IMDUR) 30 MG 24 hr tablet; Take 0.5 tablets (15 mg total) by mouth daily. -     metoprolol tartrate (LOPRESSOR) 50 MG tablet; Take 1 tablet (50 mg total) by mouth 2 (two) times daily with a meal.   F/u tomorrow for fasting labs.

## 2022-01-02 ENCOUNTER — Other Ambulatory Visit: Payer: Medicaid Other

## 2022-01-02 DIAGNOSIS — Z951 Presence of aortocoronary bypass graft: Secondary | ICD-10-CM

## 2022-01-02 DIAGNOSIS — R7301 Impaired fasting glucose: Secondary | ICD-10-CM | POA: Diagnosis not present

## 2022-01-02 DIAGNOSIS — I25119 Atherosclerotic heart disease of native coronary artery with unspecified angina pectoris: Secondary | ICD-10-CM | POA: Diagnosis not present

## 2022-01-03 ENCOUNTER — Telehealth: Payer: Self-pay

## 2022-01-03 LAB — HEPATIC FUNCTION PANEL
ALT: 38 IU/L (ref 0–44)
AST: 29 IU/L (ref 0–40)
Albumin: 5 g/dL (ref 4.0–5.0)
Alkaline Phosphatase: 66 IU/L (ref 44–121)
Bilirubin Total: 0.6 mg/dL (ref 0.0–1.2)
Bilirubin, Direct: 0.2 mg/dL (ref 0.00–0.40)
Total Protein: 7.3 g/dL (ref 6.0–8.5)

## 2022-01-03 LAB — LIPID PANEL
Chol/HDL Ratio: 3.2 ratio (ref 0.0–5.0)
Cholesterol, Total: 103 mg/dL (ref 100–199)
HDL: 32 mg/dL — ABNORMAL LOW (ref >39)
LDL Chol Calc (NIH): 34 mg/dL (ref 0–99)
Triglycerides: 242 mg/dL — ABNORMAL HIGH (ref 0–149)
VLDL Cholesterol Cal: 37 mg/dL (ref 5–40)

## 2022-01-03 LAB — HEMOGLOBIN A1C
Est. average glucose Bld gHb Est-mCnc: 123 mg/dL
Hgb A1c MFr Bld: 5.9 % — ABNORMAL HIGH (ref 4.8–5.6)

## 2022-01-03 NOTE — Telephone Encounter (Signed)
P.A. Greig Castilla

## 2022-01-03 NOTE — Addendum Note (Signed)
Addended by: Herminio Commons A on: 01/03/2022 08:45 AM   Modules accepted: Orders

## 2022-01-06 ENCOUNTER — Other Ambulatory Visit: Payer: Self-pay | Admitting: Medical

## 2022-01-06 MED ORDER — OMEGA-3-ACID ETHYL ESTERS 1 G PO CAPS: 2.0000 g | ORAL_CAPSULE | Freq: Two times a day (BID) | ORAL | 11 refills | Status: DC

## 2022-01-07 ENCOUNTER — Other Ambulatory Visit: Payer: Self-pay

## 2022-01-07 ENCOUNTER — Telehealth: Payer: Self-pay

## 2022-01-07 MED ORDER — NEXLETOL 180 MG PO TABS: 1.0000 | ORAL_TABLET | Freq: Every day | ORAL | 3 refills | Status: DC

## 2022-01-07 MED ORDER — REPATHA SURECLICK 140 MG/ML ~~LOC~~ SOAJ
140.0000 mg | SUBCUTANEOUS | 11 refills | Status: DC
Start: 2022-01-07 — End: 2022-08-13

## 2022-01-07 NOTE — Telephone Encounter (Signed)
Pa submitted for repatha on cmm waiting on determination once approved pt will be notified of drug change.  MAXTON NOREEN ([employer]) - [id number] Repatha SureClick 140MG /ML auto-injectors

## 2022-01-07 NOTE — Addendum Note (Signed)
Addended by: Eather Colas on: 01/07/2022 12:36 PM   Modules accepted: Orders

## 2022-01-07 NOTE — Telephone Encounter (Signed)
Repatha copay card: RxBin: [id number] RxPCN: [id number] RxGrp: [id number] ID: [id number]

## 2022-01-07 NOTE — Telephone Encounter (Signed)
Pt returned call and stated that they needed to get a prior authorization for nexletol as well so I submitted one to cmm: Hunter Gomez (Key: [id number])

## 2022-01-07 NOTE — Telephone Encounter (Signed)
-----   Message from Awilda Metro, RPH-CPP sent at 01/07/2022 10:31 AM EST ----- Regarding: RE: CHANGE TO REPATHA? Not sure if someone replied to this yesterday when I was off but ok to change to Repatha and let pt know of reason for med change. Would make sure pt gets set up with copay card after as well.  Thanks, Aundra Millet ----- Message ----- From: Eather Colas, CMA Sent: 01/06/2022  11:03 AM EST To: Awilda Metro, RPH-CPP Subject: CHANGE TO REPATHA?                             Pts formulary prefers repatha 140mg  its uhc. Will a switch to that be ok?

## 2022-01-07 NOTE — Telephone Encounter (Signed)
Called and lmom the pt that repatha approved instead of praluent. Rx sent W/copay card in the prescription. Instructed the pt to call back should issues arise.

## 2022-01-12 ENCOUNTER — Other Ambulatory Visit: Payer: Self-pay | Admitting: Physician Assistant

## 2022-01-15 DIAGNOSIS — Z1211 Encounter for screening for malignant neoplasm of colon: Secondary | ICD-10-CM | POA: Diagnosis not present

## 2022-01-15 NOTE — Telephone Encounter (Signed)
P.A. approved til 01/07/23

## 2022-01-18 ENCOUNTER — Other Ambulatory Visit: Payer: Self-pay | Admitting: Physician Assistant

## 2022-01-22 LAB — COLOGUARD: COLOGUARD: NEGATIVE

## 2022-01-24 ENCOUNTER — Telehealth: Payer: Self-pay

## 2022-01-24 NOTE — Telephone Encounter (Signed)
P.A. Ernestine Conrad 3 completed & denied, appeal filed

## 2022-02-08 NOTE — Telephone Encounter (Signed)
Appeal denied, only covered if triglycerides over 500

## 2022-02-10 ENCOUNTER — Ambulatory Visit (INDEPENDENT_AMBULATORY_CARE_PROVIDER_SITE_OTHER): Payer: Medicaid Other | Admitting: Cardiovascular Disease

## 2022-02-10 ENCOUNTER — Other Ambulatory Visit: Payer: Self-pay

## 2022-02-10 ENCOUNTER — Encounter: Payer: Self-pay | Admitting: Cardiovascular Disease

## 2022-02-10 VITALS — BP 110/78 | HR 54 | Ht 69.0 in | Wt 230.4 lb

## 2022-02-10 DIAGNOSIS — I10 Essential (primary) hypertension: Secondary | ICD-10-CM | POA: Diagnosis not present

## 2022-02-10 DIAGNOSIS — E782 Mixed hyperlipidemia: Secondary | ICD-10-CM

## 2022-02-10 DIAGNOSIS — I25119 Atherosclerotic heart disease of native coronary artery with unspecified angina pectoris: Secondary | ICD-10-CM

## 2022-02-10 NOTE — Progress Notes (Signed)
?Cardiology Office Note:   ? ?Date:  02/11/2022  ? ?ID:  Hunter Gomez, DOB Mar 07, 1971, MRN [id number] ? ?PCP:  Carlena Hurl, PA-C ?  ?[facility] Providers ?Cardiologist:  Sherren Mocha, MD ?Cardiology APP:  Liliane Shi, PA-C    ? ?Referring MD: Carlena Hurl, PA-C  ? ?Chief Complaint  ?Patient presents with  ? Coronary Artery Disease  ? ? ?History of Present Illness:   ? ?Hunter Gomez is a 51 y.o. male with a hx of: ?Coronary artery disease ?S/p NSTEMI in 2016 >> s/p CABG ?Myoview 5/21: low risk  ?S/p DES to [facility] 01/2021 ?Cath: LM 100, oLAD 80, RI 60, pLCx 90, dRCA 90, RPDA 50, RPAV 50; L-LAD ok, S-D1 ok; S-OM 100, S-RI 100, RIMA-RCA 100 ?Hx of gallstone pancreatitis s/p ERCP, cholecystectomy ?Hyperlipidemia ?Hypertension  ?Echocardiogram 2/16: EF 55-60, Gr 2 DD ? ?The patient is here alone today.  He is doing well at present.  Ever since he underwent PCI of the RCA 1 year ago, his anginal symptoms have been improved.  He does report occasional angina with exertion when he is out in cold weather.  Otherwise he has had no recent symptoms.  He specifically denies shortness of breath, heart palpitations, edema, orthopnea, PND, lightheadedness, or syncope.  He has had no chest discomfort at rest or with normal activities.  He is compliant with his medications. ? ?Past Medical History:  ?Diagnosis Date  ? CAD (coronary artery disease)   ? a.  NSTEMI (1/16):  LHC - dLM 95, oLAD 90, mLAD 30, oCFX 80-90, pCFX 50, oPDA 80, oPLA 75, EF 55-65% >> urgent CABG// Myoview 03/2020: EF 54, no ischemia or infarction; low risk  ? Hepatic steatosis   ? HLD (hyperlipidemia)   ? Hx of echocardiogram   ? Echo (2/16):  Mild LVH, EF 55-60%, Gr 2 DD, trivial AI/TR, no effusion  ? Hypertension 10/2012  ? Muscle weakness 1997  ? prior seizure like activity, but none since; prior neurology eval, prior normal EEG 1997  ? Obesity   ? s/p Lap Band surgery  ? Vision problem   ? prior use of glasses, not currently  ? ? ?Past Surgical  History:  ?Procedure Laterality Date  ? CARDIAC CATHETERIZATION    ? CHOLECYSTECTOMY N/A 12/31/2016  ? Procedure: LAPAROSCOPIC CHOLECYSTECTOMY;  Surgeon: Erroll Luna, MD;  Location: [place];  Service: General;  Laterality: N/A;  ? CORONARY ARTERY BYPASS GRAFT N/A 12/18/2014  ? Procedure: CORONARY ARTERY BYPASS GRAFTING (CABG), ON PUMP, TIMES FIVE, USING BILATERAL INTERNAL MAMMARY ARTERIES, RIGHT GREATER SAPHENOUS VEIN HARVESTED ENDOSCOPICALLY;  Surgeon: Ivin Poot, MD;  Location: [place];  Service: Open Heart Surgery;  Laterality: N/A;  LIMA-LAD, SVG-D, SVG-OM, Free RIMA-[place], SVG-PD  ? CORONARY STENT INTERVENTION N/A 02/13/2021  ? Procedure: CORONARY STENT INTERVENTION;  Surgeon: Sherren Mocha, MD;  Location: [place] [facility];  Service: Cardiovascular;  Laterality: N/A;  ? ENDOSCOPIC RETROGRADE CHOLANGIOPANCREATOGRAPHY (ERCP) WITH PROPOFOL N/A 12/29/2016  ? Procedure: ENDOSCOPIC RETROGRADE CHOLANGIOPANCREATOGRAPHY (ERCP) WITH PROPOFOL;  Surgeon: Milus Banister, MD;  Location: [place];  Service: Endoscopy;  Laterality: N/A;  ? INTRAOPERATIVE TRANSESOPHAGEAL ECHOCARDIOGRAM N/A 12/18/2014  ? Procedure: INTRAOPERATIVE TRANSESOPHAGEAL ECHOCARDIOGRAM;  Surgeon: Ivin Poot, MD;  Location: [place];  Service: Open Heart Surgery;  Laterality: N/A;  ? LAPAROSCOPIC GASTRIC BANDING  2007  ? initial procedure Dr. Olevia [place]ens in [place], adjustments in [place], [place]  ? [place] GASTRIC BANDING  2006  ? LEFT HEART CATH AND CORS/GRAFTS ANGIOGRAPHY  N/A 02/13/2021  ? Procedure: LEFT HEART CATH AND CORS/GRAFTS ANGIOGRAPHY;  Surgeon: Sherren Mocha, MD;  Location: [place] [facility];  Service: Cardiovascular;  Laterality: N/A;  ? LEFT HEART CATHETERIZATION WITH CORONARY ANGIOGRAM N/A 12/18/2014  ? Procedure: LEFT HEART CATHETERIZATION WITH CORONARY ANGIOGRAM;  Surgeon: Blane Ohara, MD;  Location: [facility] CATH LAB;  Service: Cardiovascular;  Laterality: N/A;  ? TONSILLECTOMY    ? WISDOM TOOTH EXTRACTION    ? ? ?Current  Medications: ?Current Meds  ?Medication Sig  ? aspirin 81 MG tablet Take 81 mg by mouth daily.  ? Bempedoic Acid (NEXLETOL) 180 MG TABS Take 1 tablet by mouth daily.  ? clopidogrel (PLAVIX) 75 MG tablet TAKE ONE TABLET BY MOUTH DAILY  ? Evolocumab (REPATHA SURECLICK) 295 MG/ML SOAJ Inject 140 mg into the skin every 14 (fourteen) days.  ? hydrochlorothiazide (HYDRODIURIL) 25 MG tablet TAKE ONE TABLET BY MOUTH DAILY  ? isosorbide mononitrate (IMDUR) 30 MG 24 hr tablet TAKE 1/2 TABLET BY MOUTH DAILY  ? lisinopril (ZESTRIL) 20 MG tablet TAKE ONE TABLET BY MOUTH DAILY  ? metoprolol tartrate (LOPRESSOR) 50 MG tablet TAKE ONE TABLET BY MOUTH TWICE A DAY WITH A MEAL  ? nitroGLYCERIN (NITROSTAT) 0.4 MG SL tablet Place 1 tablet (0.4 mg total) under the tongue every 5 (five) minutes as needed for chest pain.  ? Omega-3 Fatty Acids (FISH OIL) 1000 MG CAPS Take 2 capsules (2,000 mg total) by mouth in the morning and at bedtime.  ? tamsulosin (FLOMAX) 0.4 MG CAPS capsule TAKE 1 CAPSULE(0.4 MG) BY MOUTH DAILY AFTER BREAKFAST  ?  ? ?Allergies:   Atorvastatin  ? ?Social History  ? ?Socioeconomic History  ? Marital status: Married  ?  Spouse name: Not on file  ? Number of children: 3  ? Years of education: 40  ? Highest education level: Not on file  ?Occupational History  ? Not on file  ?Tobacco Use  ? Smoking status: Never  ? Smokeless tobacco: Never  ?Vaping Use  ? Vaping Use: Never used  ?Substance and Sexual Activity  ? Alcohol use: No  ? Drug use: No  ? Sexual activity: Not on file  ?Other Topics Concern  ? Not on file  ?Social History Narrative  ? Married now.  Prior separated, 3 children, limited exercise, not currently working.   Stopped working 2020. Lives off anxiety.   As of 06/2021.    ? ?Social Determinants of Health  ? ?Financial Resource Strain: Not on file  ?Food Insecurity: Not on file  ?Transportation Needs: Not on file  ?Physical Activity: Not on file  ?Stress: Not on file  ?Social Connections: Not on file  ?   ? ?Family History: ?The patient's family history includes Cancer in his maternal grandfather, mother, and paternal grandmother; Diabetes in his mother; Heart disease (age of onset: 35) in his father; Heart disease (age of onset: 3) in his mother; Hyperlipidemia in his brother, father, and mother; Hypertension in his brother, father, and mother; Peripheral vascular disease in his mother; Stroke in his father. There is no history of Heart attack. ? ?ROS:   ?Please see the history of present illness.    ?All other systems reviewed and are negative. ? ?EKGs/Labs/Other Studies Reviewed:   ? ?The following studies were reviewed today: ?Cardiac Cath 02/13/21: ?1.  Severe native vessel coronary artery disease with total occlusion of the left main and severe stenosis of the distal RCA ?2.  Status post aortocoronary bypass surgery with continued  patency of the LIMA to LAD and saphenous vein graft to diagonal.  The saphenous vein graft to diagonal supplies the ramus intermedius and native left circumflex in retrograde fashion.  Those vessels both have significant obstructive disease that is unapproachable because of left main total occlusion ?3.  Total occlusion of the saphenous vein graft OM, saphenous vein graft to ramus intermedius, and free RIMA to RCA ?4.  Successful PCI of the distal RCA, reducing 90% stenosis to 0% with a 2.5 x 22 mm resolute Onyx DES ?  ?Recommendations: Same-day PCI protocol if criteria met, ongoing aggressive medical therapy, dual antiplatelet therapy with aspirin and clopidogrel at least 6 months (favor long-term if tolerated in this young patient with bypass graft failure and extensive CAD) ? ?EKG:  EKG is ordered today.  The ekg ordered today demonstrates sinus bradycardia 54 bpm, age-indeterminate inferior infarct, otherwise normal. ? ?Recent Labs: ?01/02/2022: ALT 38  ?Recent Lipid Panel ?   ?Component Value Date/Time  ? CHOL 103 01/02/2022 0838  ? TRIG 242 (H) 01/02/2022 5790  ? HDL 32 (L)  01/02/2022 3833  ? CHOLHDL 3.2 01/02/2022 0838  ? CHOLHDL 3.0 09/09/2017 1104  ? VLDL 31 12/27/2016 0912  ? [place] 34 01/02/2022 0838  ? [place] 59 09/09/2017 1104  ? ? ? ?Risk Assessment/Calculations:   ?  ? ?

## 2022-02-10 NOTE — Patient Instructions (Signed)
Medication Instructions:  ?Your physician recommends that you continue on your current medications as directed. Please refer to the Current Medication list given to you today. ? ?*If you need a refill on your cardiac medications before your next appointment, please call your pharmacy* ? ? ?Lab Work: ?Lipids and LFT in 4 months (July 2023) ? ?If you have labs (blood work) drawn today and your tests are completely normal, you will receive your results only by: ?MyChart Message (if you have MyChart) OR ?A paper copy in the mail ?If you have any lab test that is abnormal or we need to change your treatment, we will call you to review the results. ? ? ?Testing/Procedures: ?NONE ? ? ?Follow-Up: ?At [facility], you and your health needs are our priority.  As part of our continuing mission to provide you with exceptional heart care, we have created designated Provider Care Teams.  These Care Teams include your primary Cardiologist (physician) and Advanced Practice Providers (APPs -  Physician Assistants and Nurse Practitioners) who all work together to provide you with the care you need, when you need it.  ? ?Your next appointment:   ?6 month(s) ? ?The format for your next appointment:   ?In Person ? ?Provider:   ?[name], PA-C     Then, Tonny Bollman, MD will plan to see you again in 1 year(s).  ? ?  ?

## 2022-02-20 NOTE — Telephone Encounter (Signed)
Lovaza and Omega 3 both denied

## 2022-02-20 NOTE — Telephone Encounter (Signed)
Called pt and left message and sent mychart message regarding Hunter Gomez's note

## 2022-05-05 ENCOUNTER — Emergency Department (HOSPITAL_COMMUNITY): Payer: Medicaid Other

## 2022-05-05 ENCOUNTER — Other Ambulatory Visit: Payer: Self-pay

## 2022-05-05 ENCOUNTER — Emergency Department (HOSPITAL_COMMUNITY)
Admission: EM | Admit: 2022-05-05 | Discharge: 2022-05-05 | Disposition: A | Discharge status: Home / Self Care | Payer: Medicaid Other | Attending: Emergency Medicine | Admitting: Emergency Medicine

## 2022-05-05 ENCOUNTER — Encounter (HOSPITAL_COMMUNITY): Payer: Self-pay | Admitting: Emergency Medicine

## 2022-05-05 DIAGNOSIS — S61211A Laceration without foreign body of left index finger without damage to nail, initial encounter: Secondary | ICD-10-CM | POA: Diagnosis not present

## 2022-05-05 DIAGNOSIS — Z951 Presence of aortocoronary bypass graft: Secondary | ICD-10-CM | POA: Insufficient documentation

## 2022-05-05 DIAGNOSIS — Z23 Encounter for immunization: Secondary | ICD-10-CM | POA: Diagnosis not present

## 2022-05-05 DIAGNOSIS — Z7902 Long term (current) use of antithrombotics/antiplatelets: Secondary | ICD-10-CM | POA: Insufficient documentation

## 2022-05-05 DIAGNOSIS — W260XXA Contact with knife, initial encounter: Secondary | ICD-10-CM | POA: Diagnosis not present

## 2022-05-05 DIAGNOSIS — S61215A Laceration without foreign body of left ring finger without damage to nail, initial encounter: Secondary | ICD-10-CM | POA: Insufficient documentation

## 2022-05-05 DIAGNOSIS — S61217A Laceration without foreign body of left little finger without damage to nail, initial encounter: Secondary | ICD-10-CM | POA: Diagnosis not present

## 2022-05-05 DIAGNOSIS — S61412A Laceration without foreign body of left hand, initial encounter: Secondary | ICD-10-CM | POA: Diagnosis not present

## 2022-05-05 DIAGNOSIS — S6992XA Unspecified injury of left wrist, hand and finger(s), initial encounter: Secondary | ICD-10-CM | POA: Diagnosis present

## 2022-05-05 DIAGNOSIS — Z7982 Long term (current) use of aspirin: Secondary | ICD-10-CM | POA: Insufficient documentation

## 2022-05-05 MED ORDER — TETANUS-DIPHTH-ACELL PERTUSSIS 5-2.5-18.5 LF-MCG/0.5 IM SUSY
0.5000 mL | PREFILLED_SYRINGE | Freq: Once | INTRAMUSCULAR | Status: AC
Start: 2022-05-05 — End: 2022-05-05
  Administered 2022-05-05: 0.5 mL via INTRAMUSCULAR
  Filled 2022-05-05: qty 0.5

## 2022-05-05 MED ORDER — LIDOCAINE HCL (PF) 1 % IJ SOLN
20.0000 mL | Freq: Once | INTRAMUSCULAR | Status: AC
  Administered 2022-05-05: 20 mL
  Filled 2022-05-05: qty 30

## 2022-05-05 MED ORDER — LIDOCAINE-EPINEPHRINE-TETRACAINE (LET) TOPICAL GEL
3.0000 mL | Freq: Once | TOPICAL | Status: AC
Start: 2022-05-05 — End: 2022-05-05
  Administered 2022-05-05: 3 mL via TOPICAL
  Filled 2022-05-05: qty 3

## 2022-05-05 NOTE — ED Triage Notes (Signed)
Pt c/o laceration to middle, ring and pinky fingers on left hand from a chef knife x 15 mins ago, pt on Plavix

## 2022-05-05 NOTE — ED Provider Triage Note (Signed)
Emergency Medicine Provider Triage Evaluation Note  Hunter Gomez , a 51 y.o. male  was evaluated in triage.  Pt complains of laceration to left hand.  Ventral aspect digits 2 through 4.  On Plavix.  Cut on a knife.  Unknown last tetanus.  Review of Systems  Positive: Laceration Negative:   Physical Exam  There were no vitals taken for this visit. Gen:   Awake, no distress   Resp:  Normal effort  MSK:   Moves extremities without difficulty  Other:  Laceration  Medical Decision Making  Medically screening exam initiated at 7:06 PM.  Appropriate orders placed.  [name] was informed that the remainder of the evaluation will be completed by another provider, this initial triage assessment does not replace that evaluation, and the importance of remaining in the ED until their evaluation is complete.  laceration   Ophie Burrowes A, PA-C 05/05/22 1907

## 2022-05-05 NOTE — Discharge Instructions (Signed)
Sutures need to be removed in about 7 to 10 days  May take dressing off after 24 hours  Keep dry  Return for new or worsening symptoms

## 2022-05-05 NOTE — ED Provider Notes (Signed)
[facility] Provider Note   CSN: [id number] Arrival date & time: 05/05/22  1856    History  Chief Complaint  Patient presents with   Laceration    Hunter Gomez is a 51 y.o. male history of CABG x4, on Plavix here for evaluation of laceration to volar aspect left digits 2 through 4.  No pulsatile bleeding.  No numbness, weakness.  Occurred on a kitchen knife prior to arrival.  HPI     Home Medications Prior to Admission medications   Medication Sig Start Date End Date Taking? Authorizing Provider  omega-3 acid ethyl esters (LOVAZA) 1 g capsule Take 2 capsules (2 g total) by mouth 2 (two) times daily. Patient not taking: Reported on 02/10/2022 01/06/22   Tysinger, Kermit Baloavid S, PA-C  aspirin 81 MG tablet Take 81 mg by mouth daily.    [provider]  Bempedoic Acid (NEXLETOL) 180 MG TABS Take 1 tablet by mouth daily. 01/07/22   Tereso NewcomerWeaver, Scott T, PA-C  clopidogrel (PLAVIX) 75 MG tablet TAKE ONE TABLET BY MOUTH DAILY 12/02/21   Tonny Bollmanooper, Michael, MD  Evolocumab (REPATHA SURECLICK) 140 MG/ML SOAJ Inject 140 mg into the skin every 14 (fourteen) days. 01/07/22   Tereso NewcomerWeaver, Scott T, PA-C  hydrochlorothiazide (HYDRODIURIL) 25 MG tablet TAKE ONE TABLET BY MOUTH DAILY 08/06/21   Tonny Bollmanooper, Michael, MD  isosorbide mononitrate (IMDUR) 30 MG 24 hr tablet TAKE 1/2 TABLET BY MOUTH DAILY 01/20/22   Tonny Bollmanooper, Michael, MD  lisinopril (ZESTRIL) 20 MG tablet TAKE ONE TABLET BY MOUTH DAILY 11/20/21   Tonny Bollmanooper, Michael, MD  metoprolol tartrate (LOPRESSOR) 50 MG tablet TAKE ONE TABLET BY MOUTH TWICE A DAY WITH A MEAL 01/13/22   Tonny Bollmanooper, Michael, MD  nitroGLYCERIN (NITROSTAT) 0.4 MG SL tablet Place 1 tablet (0.4 mg total) under the tongue every 5 (five) minutes as needed for chest pain. 05/29/20   Tereso NewcomerWeaver, Scott T, PA-C  Omega-3 Fatty Acids (FISH OIL) 1000 MG CAPS Take 2 capsules (2,000 mg total) by mouth in the morning and at bedtime. 06/06/21 06/06/22  Tereso NewcomerWeaver, Scott T, PA-C  tamsulosin  (FLOMAX) 0.4 MG CAPS capsule TAKE 1 CAPSULE(0.4 MG) BY MOUTH DAILY AFTER BREAKFAST 01/01/22   Tysinger, Kermit Baloavid S, PA-C      Allergies    Atorvastatin    Review of Systems   Review of Systems  Constitutional: Negative.   HENT: Negative.    Respiratory: Negative.    Cardiovascular: Negative.   Gastrointestinal: Negative.   Genitourinary: Negative.   Musculoskeletal: Negative.   Skin:  Positive for wound.  Neurological: Negative.   All other systems reviewed and are negative.  Physical Exam Updated Vital Signs BP (!) 149/97 (BP Location: Left Arm)   Pulse 67   Temp 98.3 F (36.8 C) (Oral)   Resp 17   Ht 5\' 9"  (1.753 m)   Wt 104.3 kg   SpO2 94%   BMI 33.97 kg/m  Physical Exam Vitals and nursing note reviewed.  Constitutional:      General: He is not in acute distress.    Appearance: He is well-developed. He is not ill-appearing, toxic-appearing or diaphoretic.  HENT:     Head: Normocephalic and atraumatic.     Nose: Nose normal.     Mouth/Throat:     Mouth: Mucous membranes are moist.  Eyes:     Pupils: Pupils are equal, round, and reactive to light.  Cardiovascular:     Rate and Rhythm: Normal rate and regular rhythm.  Pulses: Normal pulses.          Radial pulses are 2+ on the right side and 2+ on the left side.     Heart sounds: Normal heart sounds.  Pulmonary:     Effort: Pulmonary effort is normal. No respiratory distress.     Breath sounds: Normal breath sounds.  Abdominal:     General: Bowel sounds are normal. There is no distension.     Palpations: Abdomen is soft.  Musculoskeletal:        General: Normal range of motion.     Cervical back: Normal range of motion and neck supple.     Comments: No bony tenderness, full range of motion  Skin:    General: Skin is warm and dry.     Capillary Refill: Capillary refill takes less than 2 seconds.     Comments: 1 cm laceration 5th digit volar aspect mid phalanx 1.5 cm laceration 4th digit volar aspect mid  phalanx 1.5 cm laceration 3rd  digit volar aspect mid phalanx  Neurological:     General: No focal deficit present.     Mental Status: He is alert and oriented to person, place, and time.    ED Results / Procedures / Treatments   Labs (all labs ordered are listed, but only abnormal results are displayed) Labs Reviewed - No data to display  EKG None  Radiology DG Hand Complete Left  Result Date: 05/05/2022 CLINICAL DATA:  Left hand laceration EXAM: LEFT HAND - COMPLETE 3+ VIEW COMPARISON:  None Available. FINDINGS: There is no evidence of fracture or dislocation. There is no evidence of arthropathy or other focal bone abnormality. Soft tissues are unremarkable. IMPRESSION: Negative. Electronically Signed   By: Helyn Numbers M.D.   On: 05/05/2022 19:42    Procedures .Marland KitchenLaceration Repair  Date/Time: 05/05/2022 10:41 PM  Performed by: Ralph Leyden A, PA-C Authorized by: Linwood Dibbles, PA-C   Consent:    Consent obtained:  Verbal   Consent given by:  Patient   Risks discussed:  Infection, need for additional repair, pain, poor cosmetic result and poor wound healing   Alternatives discussed:  No treatment and delayed treatment Universal protocol:    Procedure explained and questions answered to patient or proxy's satisfaction: yes     Relevant documents present and verified: yes     Test results available: yes     Imaging studies available: yes     Required blood products, implants, devices, and special equipment available: yes     Site/side marked: yes     Immediately prior to procedure, a time out was called: yes     Patient identity confirmed:  Verbally with patient Anesthesia:    Anesthesia method:  Topical application   Topical anesthetic:  LET Laceration details:    Location:  Finger   Finger location:  L small finger   Length (cm):  1 Pre-procedure details:    Preparation:  Patient was prepped and draped in usual sterile fashion and imaging obtained to  evaluate for foreign bodies Exploration:    Hemostasis achieved with:  Direct pressure   Imaging obtained: x-ray     Imaging outcome: foreign body not noted     Wound exploration: wound explored through full range of motion     Wound extent: no foreign bodies/material noted, no muscle damage noted, no nerve damage noted, no tendon damage noted, no underlying fracture noted and no vascular damage noted   Treatment:    Area  cleansed with:  Povidone-iodine   Amount of cleaning:  Extensive   Irrigation solution:  Sterile saline   Irrigation method:  Pressure wash Skin repair:    Repair method:  Sutures   Suture size:  5-0   Suture material:  Prolene   Suture technique:  Simple interrupted   Number of sutures:  2 Approximation:    Approximation:  Close Repair type:    Repair type:  Intermediate Post-procedure details:    Dressing:  Bulky dressing   Procedure completion:  Tolerated well, no immediate complications .Marland KitchenLaceration Repair  Date/Time: 05/05/2022 10:43 PM  Performed by: Ralph Leyden A, PA-C Authorized by: Linwood Dibbles, PA-C   Consent:    Consent obtained:  Verbal   Consent given by:  Patient   Risks discussed:  Infection, need for additional repair, pain, poor cosmetic result and poor wound healing   Alternatives discussed:  No treatment and delayed treatment Universal protocol:    Procedure explained and questions answered to patient or proxy's satisfaction: yes     Relevant documents present and verified: yes     Test results available: yes     Imaging studies available: yes     Required blood products, implants, devices, and special equipment available: yes     Site/side marked: yes     Immediately prior to procedure, a time out was called: yes     Patient identity confirmed:  Verbally with patient Anesthesia:    Anesthesia method:  Local infiltration   Local anesthetic:  Lidocaine 1% w/o epi Laceration details:    Location:  Finger   Finger location:   L ring finger   Length (cm):  1 Pre-procedure details:    Preparation:  Patient was prepped and draped in usual sterile fashion and imaging obtained to evaluate for foreign bodies Exploration:    Limited defect created (wound extended): no     Hemostasis achieved with:  Direct pressure   Imaging obtained: x-ray     Imaging outcome: foreign body not noted     Wound exploration: wound explored through full range of motion and entire depth of wound visualized     Wound extent: no foreign bodies/material noted, no muscle damage noted, no tendon damage noted, no underlying fracture noted and no vascular damage noted     Contaminated: no   Treatment:    Area cleansed with:  Povidone-iodine   Amount of cleaning:  Extensive   Irrigation solution:  Sterile saline   Irrigation method:  Pressure wash Skin repair:    Repair method:  Sutures   Suture size:  5-0   Suture material:  Prolene   Suture technique:  Simple interrupted   Number of sutures:  2 Approximation:    Approximation:  Close Repair type:    Repair type:  Intermediate Post-procedure details:    Dressing:  Bulky dressing   Procedure completion:  Tolerated well, no immediate complications .Marland KitchenLaceration Repair  Date/Time: 05/05/2022 10:44 PM  Performed by: Linwood Dibbles, PA-C Authorized by: Linwood Dibbles, PA-C   Consent:    Consent obtained:  Verbal   Consent given by:  Patient   Risks discussed:  Infection, need for additional repair, pain, poor cosmetic result and poor wound healing   Alternatives discussed:  No treatment and delayed treatment Universal protocol:    Procedure explained and questions answered to patient or proxy's satisfaction: yes     Relevant documents present and verified: yes     Test results available:  yes     Imaging studies available: yes     Required blood products, implants, devices, and special equipment available: yes     Site/side marked: yes     Immediately prior to procedure, a  time out was called: yes     Patient identity confirmed:  Verbally with patient Anesthesia:    Anesthesia method:  Nerve block   Block injection procedure:  Anatomic landmarks identified, anatomic landmarks palpated, introduced needle, negative aspiration for blood and incremental injection   Block outcome:  Anesthesia achieved Laceration details:    Location:  Finger   Finger location:  L long finger   Length (cm):  2 Pre-procedure details:    Preparation:  Patient was prepped and draped in usual sterile fashion and imaging obtained to evaluate for foreign bodies Exploration:    Hemostasis achieved with:  Direct pressure   Imaging obtained: x-ray     Imaging outcome: foreign body not noted     Wound exploration: wound explored through full range of motion and entire depth of wound visualized     Wound extent: no foreign bodies/material noted, no muscle damage noted, no nerve damage noted, no tendon damage noted, no underlying fracture noted and no vascular damage noted     Contaminated: no   Treatment:    Area cleansed with:  Povidone-iodine   Amount of cleaning:  Extensive   Irrigation solution:  Sterile saline Skin repair:    Repair method:  Sutures   Suture size:  5-0   Suture material:  Prolene   Suture technique:  Simple interrupted   Number of sutures:  5 Approximation:    Approximation:  Close Repair type:    Repair type:  Intermediate Post-procedure details:    Dressing:  Bulky dressing   Procedure completion:  Tolerated well, no immediate complications .Nerve Block  Date/Time: 05/05/2022 10:44 PM  Performed by: Linwood Dibbles, PA-C Authorized by: Linwood Dibbles, PA-C   Consent:    Consent obtained:  Verbal   Consent given by:  Patient   Risks, benefits, and alternatives were discussed: yes     Risks discussed:  Bleeding, intravenous injection, unsuccessful block, swelling, nerve damage, pain, infection and allergic reaction   Alternatives discussed:  No  treatment, delayed treatment, alternative treatment and referral Universal protocol:    Procedure explained and questions answered to patient or proxy's satisfaction: yes     Relevant documents present and verified: yes     Test results available: yes     Imaging studies available: yes     Required blood products, implants, devices, and special equipment available: yes     Site/side marked: yes     Immediately prior to procedure, a time out was called: yes     Patient identity confirmed:  Verbally with patient Indications:    Indications:  Pain relief Location:    Body area:  Upper extremity   Upper extremity nerve blocked: digital. Pre-procedure details:    Preparation: Patient was prepped and draped in usual sterile fashion   Skin anesthesia:    Skin anesthesia method:  None Procedure details:    Anesthetic injected:  Lidocaine 1% w/o epi   Steroid injected:  None   Additive injected:  None   Injection procedure:  Anatomic landmarks identified, incremental injection, negative aspiration for blood, anatomic landmarks palpated and introduced needle Post-procedure details:    Dressing:  Sterile dressing   Outcome:  Anesthesia achieved   Procedure completion:  Tolerated well,  no immediate complications     Medications Ordered in ED Medications  Tdap (BOOSTRIX) injection 0.5 mL (0.5 mLs Intramuscular Given 05/05/22 2247)  lidocaine-EPINEPHrine-tetracaine (LET) topical gel (3 mLs Topical Given 05/05/22 2134)  lidocaine (PF) (XYLOCAINE) 1 % injection 20 mL (20 mLs Infiltration Given by Other 05/05/22 2134)    ED Course/ Medical Decision Making/ A&P     51 year old here for evaluation of lacerations which occurred in the kitchen knife just prior to arrival.  Patient with steady bleeding however no pulsatile bleeding to suggest acute arterial injury.  He is neurovascularly intact.  No bony tenderness.  Patient has total 3 lacerations mid phalanx volar aspect left upper extremity  digits 3 through 5  Imaging personally viewed and interpreted:  X-ray no fracture, dislocation  See procedure note.  Patient tolerated well.  Patient with total of 9 sutures between all 3 digits.  Resolved.  Discussed wound care, return for new or worsening symptoms.  Patient agreeable  Low suspicion for open fracture, infection, tendon/ligament injury, vascular injury  The patient has been appropriately medically screened and/or stabilized in the ED. I have low suspicion for any other emergent medical condition which would require further screening, evaluation or treatment in the ED or require inpatient management.  Patient is hemodynamically stable and in no acute distress.  Patient able to ambulate in department prior to ED.  Evaluation does not show acute pathology that would require ongoing or additional emergent interventions while in the emergency department or further inpatient treatment.  I have discussed the diagnosis with the patient and answered all questions.  Pain is been managed while in the emergency department and patient has no further complaints prior to discharge.  Patient is comfortable with plan discussed in room and is stable for discharge at this time.  I have discussed strict return precautions for returning to the emergency department.  Patient was encouraged to follow-up with PCP/specialist refer to at discharge.                           Medical Decision Making Amount and/or Complexity of Data Reviewed External Data Reviewed: labs, radiology and notes. Radiology: ordered and independent interpretation performed. Decision-making details documented in ED Course.  Risk OTC drugs. Prescription drug management. Diagnosis or treatment significantly limited by social determinants of health. Minor surgery with identified risk factors.          Final Clinical Impression(s) / ED Diagnoses Final diagnoses:  Laceration of left little finger without foreign body  without damage to nail, initial encounter  Laceration of left ring finger without foreign body without damage to nail, initial encounter  Laceration of left index finger without foreign body without damage to nail, initial encounter    Rx / DC Orders ED Discharge Orders     None         Levon Boettcher A, PA-C 05/05/22 2248    Mancel Bale, MD 05/07/22 [phone]

## 2022-05-06 ENCOUNTER — Telehealth: Payer: Self-pay

## 2022-05-06 NOTE — Telephone Encounter (Signed)
Transition Care Management Follow-up Telephone Call Date of discharge and from where: 05/05/2022-[facility] How have you been since you were released from the hospital? Pt stated he is doing fine. Any questions or concerns? No  Items Reviewed: Did the pt receive and understand the discharge instructions provided? Yes  Medications obtained and verified?  No meds given at discharge Other? No  Any new allergies since your discharge? No  Dietary orders reviewed? No Do you have support at home? Yes   Home Care and Equipment/Supplies: Were home health services ordered? not applicable If so, what is the name of the agency? N/A  Has the agency set up a time to come to the patient's home? not applicable Were any new equipment or medical supplies ordered?  No What is the name of the medical supply agency? N/A Were you able to get the supplies/equipment? not applicable Do you have any questions related to the use of the equipment or supplies? No  Functional Questionnaire: (I = Independent and D = Dependent) ADLs: I  Bathing/Dressing- I  Meal Prep- I  Eating- I  Maintaining continence- I  Transferring/Ambulation- I  Managing Meds- I  Follow up appointments reviewed:  PCP Hospital f/u appt confirmed? No   Specialist Hospital f/u appt confirmed? No   Are transportation arrangements needed? No  If their condition worsens, is the pt aware to call PCP or go to the Emergency Dept.? Yes Was the patient provided with contact information for the PCP's office or ED? Yes Was to pt encouraged to call back with questions or concerns? Yes

## 2022-05-14 ENCOUNTER — Ambulatory Visit: Payer: Medicaid Other | Admitting: Medical

## 2022-05-16 ENCOUNTER — Ambulatory Visit (INDEPENDENT_AMBULATORY_CARE_PROVIDER_SITE_OTHER): Payer: Medicaid Other | Admitting: Medical

## 2022-05-16 ENCOUNTER — Encounter: Payer: Self-pay | Admitting: Medical

## 2022-05-16 VITALS — BP 130/80 | HR 53 | Wt 230.4 lb

## 2022-05-16 DIAGNOSIS — Z79899 Other long term (current) drug therapy: Secondary | ICD-10-CM

## 2022-05-16 DIAGNOSIS — Z4802 Encounter for removal of sutures: Secondary | ICD-10-CM | POA: Diagnosis not present

## 2022-05-16 DIAGNOSIS — S61219D Laceration without foreign body of unspecified finger without damage to nail, subsequent encounter: Secondary | ICD-10-CM

## 2022-06-02 ENCOUNTER — Other Ambulatory Visit: Payer: Self-pay

## 2022-06-02 MED ORDER — HYDROCHLOROTHIAZIDE 25 MG PO TABS: 25.0000 mg | ORAL_TABLET | Freq: Every day | ORAL | 8 refills | Status: DC

## 2022-06-17 ENCOUNTER — Other Ambulatory Visit: Payer: Medicaid Other

## 2022-06-17 DIAGNOSIS — I25119 Atherosclerotic heart disease of native coronary artery with unspecified angina pectoris: Secondary | ICD-10-CM

## 2022-06-17 DIAGNOSIS — I10 Essential (primary) hypertension: Secondary | ICD-10-CM | POA: Diagnosis not present

## 2022-06-17 DIAGNOSIS — E782 Mixed hyperlipidemia: Secondary | ICD-10-CM

## 2022-06-17 LAB — LIPID PANEL
Chol/HDL Ratio: 4.4 ratio (ref 0.0–5.0)
Cholesterol, Total: 132 mg/dL (ref 100–199)
HDL: 30 mg/dL — ABNORMAL LOW (ref >39)
LDL Chol Calc (NIH): 55 mg/dL (ref 0–99)
Triglycerides: 302 mg/dL — ABNORMAL HIGH (ref 0–149)
VLDL Cholesterol Cal: 47 mg/dL — ABNORMAL HIGH (ref 5–40)

## 2022-06-17 LAB — HEPATIC FUNCTION PANEL
ALT: 37 IU/L (ref 0–44)
AST: 34 IU/L (ref 0–40)
Albumin: 4.8 g/dL (ref 4.1–5.1)
Alkaline Phosphatase: 70 IU/L (ref 44–121)
Bilirubin Total: 0.5 mg/dL (ref 0.0–1.2)
Bilirubin, Direct: 0.17 mg/dL (ref 0.00–0.40)
Total Protein: 7.1 g/dL (ref 6.0–8.5)

## 2022-06-23 ENCOUNTER — Encounter: Payer: Self-pay | Admitting: Cardiovascular Disease

## 2022-07-30 ENCOUNTER — Encounter: Payer: Self-pay | Admitting: Internal Medicine

## 2022-08-01 ENCOUNTER — Encounter: Payer: Self-pay | Admitting: Cardiovascular Disease

## 2022-08-01 ENCOUNTER — Ambulatory Visit: Payer: Medicaid Other | Admitting: Cardiovascular Disease

## 2022-08-13 ENCOUNTER — Ambulatory Visit: Payer: Medicaid Other | Attending: Cardiovascular Disease | Admitting: Cardiovascular Disease

## 2022-08-13 ENCOUNTER — Encounter: Payer: Self-pay | Admitting: Cardiovascular Disease

## 2022-08-13 VITALS — BP 144/86 | HR 57 | Ht 69.5 in | Wt 235.0 lb

## 2022-08-13 DIAGNOSIS — E782 Mixed hyperlipidemia: Secondary | ICD-10-CM

## 2022-08-13 DIAGNOSIS — I10 Essential (primary) hypertension: Secondary | ICD-10-CM

## 2022-08-13 DIAGNOSIS — I25119 Atherosclerotic heart disease of native coronary artery with unspecified angina pectoris: Secondary | ICD-10-CM

## 2022-08-13 MED ORDER — SIMVASTATIN 40 MG PO TABS: 40.0000 mg | ORAL_TABLET | Freq: Every day | ORAL | 3 refills | Status: DC

## 2022-08-13 NOTE — Progress Notes (Signed)
Cardiology Office Note:    Date:  08/13/2022   ID:  Monika Salk, DOB [birth year], MRN [id number]  PCP:  Carlena Hurl, PA-C   [facility] Providers Cardiologist:  Sherren Mocha, MD Cardiology APP:  [facility]     Referring MD: Carlena Hurl, PA-C   Chief Complaint  Patient presents with   Coronary Artery Disease    History of Present Illness:    Hunter Gomez is a 51 y.o. male with a hx of: Coronary artery disease S/p NSTEMI in 2016 >> s/p CABG Myoview 5/21: low risk  S/p DES to [facility] 01/2021 Cath: LM 100, oLAD 80, RI 67, pLCx 77, dRCA 90, RPDA 13, RPAV 55; L-LAD ok, S-D1 ok; S-OM 100, S-RI 100, RIMA-RCA 100 Hx of gallstone pancreatitis s/p ERCP, cholecystectomy Hyperlipidemia Hypertension  Echocardiogram 2/16: EF 55-60, Gr 2 DD  The patient is here alone today.  He is doing well from a symptomatic perspective.  He is walking with no exertional chest pain or pressure.  No dyspnea, heart palpitations, orthopnea, or PND.  He plans to make a change in his insurance, going from Medicaid to some type of commercial plan.  As part of his transition to the commercial plan.  He has researched medication cost.  He is no longer going to be able to take a PCSK9 inhibitor because it would cost him nearly $1500.  We spent a good bit of time talking about potential medication changes today.  He has previously been intolerant to rosuvastatin and atorvastatin because of diarrhea.  Past Medical History:  Diagnosis Date   CAD (coronary artery disease)    a.  NSTEMI (1/16):  LHC - dLM 95, oLAD 90, mLAD 30, oCFX 80-90, pCFX 50, oPDA 80, oPLA 75, EF 55-65% >> urgent CABG// Myoview 03/2020: EF 54, no ischemia or infarction; low risk   Hepatic steatosis    HLD (hyperlipidemia)    Hx of echocardiogram    Echo (2/16):  Mild LVH, EF 55-60%, Gr 2 DD, trivial AI/TR, no effusion   Hypertension 10/2012   Muscle weakness 1997   prior seizure like activity, but none since; prior  neurology eval, prior normal EEG 1997   Obesity    s/p Lap Band surgery   Vision problem    prior use of glasses, not currently    Past Surgical History:  Procedure Laterality Date   CARDIAC CATHETERIZATION     CHOLECYSTECTOMY N/A 12/31/2016   Procedure: LAPAROSCOPIC CHOLECYSTECTOMY;  Surgeon: Erroll Luna, MD;  Location: [facility];  Service: General;  Laterality: N/A;   CORONARY ARTERY BYPASS GRAFT N/A 12/18/2014   Procedure: CORONARY ARTERY BYPASS GRAFTING (CABG), ON PUMP, TIMES FIVE, USING BILATERAL INTERNAL MAMMARY ARTERIES, RIGHT GREATER SAPHENOUS VEIN HARVESTED ENDOSCOPICALLY;  Surgeon: Ivin Poot, MD;  Location: [facility];  Service: Open Heart Surgery;  Laterality: N/A;  LIMA-LAD, SVG-D, SVG-OM, Free RIMA-[place], SVG-PD   CORONARY STENT INTERVENTION N/A 02/13/2021   Procedure: CORONARY STENT INTERVENTION;  Surgeon: Sherren Mocha, MD;  Location: [facility] [place] [facility];  Service: Cardiovascular;  Laterality: N/A;   ENDOSCOPIC RETROGRADE CHOLANGIOPANCREATOGRAPHY (ERCP) WITH PROPOFOL N/A 12/29/2016   Procedure: ENDOSCOPIC RETROGRADE CHOLANGIOPANCREATOGRAPHY (ERCP) WITH PROPOFOL;  Surgeon: Milus Banister, MD;  Location: [facility];  Service: Endoscopy;  Laterality: N/A;   INTRAOPERATIVE TRANSESOPHAGEAL ECHOCARDIOGRAM N/A 12/18/2014   Procedure: INTRAOPERATIVE TRANSESOPHAGEAL ECHOCARDIOGRAM;  Surgeon: Ivin Poot, MD;  Location: [facility];  Service: Open Heart Surgery;  Laterality: N/A;   LAPAROSCOPIC GASTRIC BANDING  2007   initial procedure Dr. Olevia Bowens in [place], adjustments in [place], [employer]  2006   LEFT HEART CATH AND CORS/GRAFTS ANGIOGRAPHY N/A 02/13/2021   Procedure: LEFT HEART CATH AND CORS/GRAFTS ANGIOGRAPHY;  Surgeon: Sherren Mocha, MD;  Location: [place] [place] [facility];  Service: Cardiovascular;  Laterality: N/A;   LEFT HEART CATHETERIZATION WITH CORONARY ANGIOGRAM N/A 12/18/2014   Procedure: LEFT HEART CATHETERIZATION WITH CORONARY ANGIOGRAM;  Surgeon:  Blane Ohara, MD;  Location: [facility] CATH LAB;  Service: Cardiovascular;  Laterality: N/A;   TONSILLECTOMY     WISDOM TOOTH EXTRACTION      Current Medications: Current Meds  Medication Sig   aspirin 81 MG tablet Take 81 mg by mouth daily.   Bempedoic Acid (NEXLETOL) 180 MG TABS Take 1 tablet by mouth daily.   clopidogrel (PLAVIX) 75 MG tablet TAKE ONE TABLET BY MOUTH DAILY   hydrochlorothiazide (HYDRODIURIL) 25 MG tablet Take 1 tablet (25 mg total) by mouth daily.   isosorbide mononitrate (IMDUR) 30 MG 24 hr tablet TAKE 1/2 TABLET BY MOUTH DAILY   lisinopril (ZESTRIL) 20 MG tablet TAKE ONE TABLET BY MOUTH DAILY   metoprolol tartrate (LOPRESSOR) 50 MG tablet TAKE ONE TABLET BY MOUTH TWICE A DAY WITH A MEAL   nitroGLYCERIN (NITROSTAT) 0.4 MG SL tablet Place 1 tablet (0.4 mg total) under the tongue every 5 (five) minutes as needed for chest pain.   simvastatin (ZOCOR) 40 MG tablet Take 1 tablet (40 mg total) by mouth at bedtime.   tamsulosin (FLOMAX) 0.4 MG CAPS capsule TAKE 1 CAPSULE(0.4 MG) BY MOUTH DAILY AFTER BREAKFAST   [DISCONTINUED] Evolocumab (REPATHA SURECLICK) 409 MG/ML SOAJ Inject 140 mg into the skin every 14 (fourteen) days.     Allergies:   Atorvastatin   Social History   Socioeconomic History   Marital status: Married    Spouse name: Not on file   Number of children: 3   Years of education: 16   Highest education level: Not on file  Occupational History   Not on file  Tobacco Use   Smoking status: Never   Smokeless tobacco: Never  Vaping Use   Vaping Use: Never used  Substance and Sexual Activity   Alcohol use: No   Drug use: No   Sexual activity: Not on file  Other Topics Concern   Not on file  Social History Narrative   Married now.  Prior separated, 3 children, limited exercise, not currently working.   Stopped working 2020. Lives off anxiety.   As of 06/2021.     Social Determinants of Health   Financial Resource Strain: Not on file  Food Insecurity:  Not on file  Transportation Needs: Not on file  Physical Activity: Not on file  Stress: Not on file  Social Connections: Not on file     Family History: The patient's family history includes Cancer in his maternal grandfather, mother, and paternal grandmother; Diabetes in his mother; Heart disease (age of onset: 56) in his father; Heart disease (age of onset: 82) in his mother; Hyperlipidemia in his brother, father, and mother; Hypertension in his brother, father, and mother; Peripheral vascular disease in his mother; Stroke in his father. There is no history of Heart attack.  ROS:   Please see the history of present illness.    All other systems reviewed and are negative.  EKGs/Labs/Other Studies Reviewed:    The following studies were reviewed today: Cardiac catheterization 02/13/2021: 1.  Severe native vessel  coronary artery disease with total occlusion of the left main and severe stenosis of the distal RCA 2.  Status post aortocoronary bypass surgery with continued patency of the LIMA to LAD and saphenous vein graft to diagonal.  The saphenous vein graft to diagonal supplies the ramus intermedius and native left circumflex in retrograde fashion.  Those vessels both have significant obstructive disease that is unapproachable because of left main total occlusion 3.  Total occlusion of the saphenous vein graft OM, saphenous vein graft to ramus intermedius, and free RIMA to RCA 4.  Successful PCI of the distal RCA, reducing 90% stenosis to 0% with a 2.5 x 22 mm resolute Onyx DES   Recommendations: Same-day PCI protocol if criteria met, ongoing aggressive medical therapy, dual antiplatelet therapy with aspirin and clopidogrel at least 6 months (favor long-term if tolerated in this young patient with bypass graft failure and extensive CAD)  EKG:  EKG is not ordered today.    Recent Labs: 06/17/2022: ALT 37  Recent Lipid Panel    Component Value Date/Time   CHOL 132 06/17/2022 0810   TRIG  302 (H) 06/17/2022 0810   HDL 30 (L) 06/17/2022 0810   CHOLHDL 4.4 06/17/2022 0810   CHOLHDL 3.0 09/09/2017 1104   VLDL 31 12/27/2016 0912   LDLCALC 55 06/17/2022 0810   LDLCALC 59 09/09/2017 1104     Risk Assessment/Calculations:      HYPERTENSION CONTROL Vitals:   08/13/22 0917 08/13/22 0958  BP: (!) 130/90 (!) 144/86    The patient's blood pressure is elevated above target today.  In order to address the patient's elevated BP: Blood pressure will be monitored at home to determine if medication changes need to be made.            Physical Exam:    VS:  BP (!) 144/86   Pulse (!) 57   Ht 5' 9.5" (1.765 m)   Wt 235 lb (106.6 kg)   SpO2 96%   BMI 34.21 kg/m     Wt Readings from Last 3 Encounters:  08/13/22 235 lb (106.6 kg)  05/16/22 230 lb 6.4 oz (104.5 kg)  05/05/22 230 lb (104.3 kg)     GEN:  Well nourished, well developed in no acute distress HEENT: Normal NECK: No JVD; No carotid bruits LYMPHATICS: No lymphadenopathy CARDIAC: RRR, no murmurs, rubs, gallops RESPIRATORY:  Clear to auscultation without rales, wheezing or rhonchi  ABDOMEN: Soft, non-tender, non-distended MUSCULOSKELETAL:  No edema; No deformity  SKIN: Warm and dry NEUROLOGIC:  Alert and oriented x 3 PSYCHIATRIC:  Normal affect   ASSESSMENT:    1. Coronary artery disease involving native coronary artery of native heart with angina pectoris ([place])   2. Essential hypertension   3. Mixed hyperlipidemia    PLAN:    In order of problems listed above:  The patient is stable with no symptoms of angina since undergoing PCI last year.  He remains on dual antiplatelet therapy with aspirin and clopidogrel and I am inclined to keep him on aggressive treatment considering his young age, early bypass graft failure, and history of PCI. Blood pressure mildly elevated.  Patient is under a lot of stress with issues related to his son.  We will keep an eye on things for now.  Continue hydrochlorothiazide,  isosorbide, lisinopril, and metoprolol.  Check labs in 3 to 4 months. The patient has been intolerant to atorvastatin and rosuvastatin because of diarrhea.  He is making a change in his insurance and  he is no longer going to be able to afford PCSK9 inhibitors.  He would like to trial a different statin drug.  We will try him on simvastatin 40 mg daily with lipids and LFTs in 3 to 4 months.  He will follow-up in 1 year.  Depending on his response and tolerance to simvastatin, he may require referral back to the lipid clinic in the future.           Medication Adjustments/Labs and Tests Ordered: Current medicines are reviewed at length with the patient today.  Concerns regarding medicines are outlined above.  Orders Placed This Encounter  Procedures   CBC   Comprehensive metabolic panel   Lipid panel   Meds ordered this encounter  Medications   simvastatin (ZOCOR) 40 MG tablet    Sig: Take 1 tablet (40 mg total) by mouth at bedtime.    Dispense:  90 tablet    Refill:  3    Replaces Repatha d/t insurance    Patient Instructions  Medication Instructions:  STOP Repatha START Simvastatin (Zocor) 46m daily *If you need a refill on your cardiac medications before your next appointment, please call your pharmacy*   Lab Work: CBC, CMET, LIPIDS in 4 months If you have labs (blood work) drawn today and your tests are completely normal, you will receive your results only by: [facility](if you have MyChart) OR A paper copy in the mail If you have any lab test that is abnormal or we need to change your treatment, we will call you to review the results.   Testing/Procedures: NONE   Follow-Up: [facility] and your [facility] needs are our [facility].  As part of our continuing mission to provide you with exceptional heart care, we have created designated Provider Care Teams.  These Care Teams include your primary Cardiologist (physician) and Advanced Practice Providers  (APPs -  Physician Assistants and Nurse Practitioners) who all work together to provide you with the care you need, when you need it.  Your next appointment:   1 year(s)  The format for your next appointment:   In Person  Provider:   MSherren Mocha MD   or APP    Important Information About Sugar         Signed, MSherren Mocha MD  08/13/2022 1:24 PM    [place]

## 2022-08-13 NOTE — Patient Instructions (Signed)
Medication Instructions:  STOP Repatha START Simvastatin (Zocor) 40mg  daily *If you need a refill on your cardiac medications before your next appointment, please call your pharmacy*   Lab Work: CBC, CMET, LIPIDS in 4 months If you have labs (blood work) drawn today and your tests are completely normal, you will receive your results only by: [place] (if you have MyChart) OR A paper copy in the mail If you have any lab test that is abnormal or we need to change your treatment, we will call you to review the results.   Testing/Procedures: NONE   Follow-Up: At [facility], you and your health needs are our priority.  As part of our continuing mission to provide you with exceptional heart care, we have created designated Provider Care Teams.  These Care Teams include your primary Cardiologist (physician) and Advanced Practice Providers (APPs -  Physician Assistants and Nurse Practitioners) who all work together to provide you with the care you need, when you need it.  Your next appointment:   1 year(s)  The format for your next appointment:   In Person  Provider:   Sherren Mocha, MD   or APP    Important Information About Sugar

## 2022-08-20 ENCOUNTER — Other Ambulatory Visit: Payer: Self-pay | Admitting: *Deleted

## 2022-08-20 MED ORDER — LISINOPRIL 20 MG PO TABS: 20.0000 mg | ORAL_TABLET | Freq: Every day | ORAL | 3 refills | Status: DC

## 2022-08-20 MED ORDER — ISOSORBIDE MONONITRATE ER 30 MG PO TB24: 15.0000 mg | ORAL_TABLET | Freq: Every day | ORAL | 3 refills | Status: DC

## 2022-08-22 ENCOUNTER — Other Ambulatory Visit: Payer: Self-pay | Admitting: *Deleted

## 2022-08-22 MED ORDER — CLOPIDOGREL BISULFATE 75 MG PO TABS: 75.0000 mg | ORAL_TABLET | Freq: Every day | ORAL | 3 refills | Status: DC

## 2022-09-02 ENCOUNTER — Encounter: Payer: Self-pay | Admitting: Internal Medicine

## 2022-10-14 ENCOUNTER — Encounter: Payer: Self-pay | Admitting: Cardiovascular Disease

## 2022-10-14 MED ORDER — METOPROLOL TARTRATE 50 MG PO TABS
ORAL_TABLET | ORAL | 3 refills | Status: DC
Start: 2022-10-14 — End: 2023-04-28

## 2022-10-15 ENCOUNTER — Telehealth: Payer: 59 | Admitting: Medical

## 2022-10-15 ENCOUNTER — Encounter: Payer: Self-pay | Admitting: Medical

## 2022-10-15 VITALS — Temp 98.1°F

## 2022-10-15 DIAGNOSIS — R051 Acute cough: Secondary | ICD-10-CM | POA: Diagnosis not present

## 2022-10-15 DIAGNOSIS — J0110 Acute frontal sinusitis, unspecified: Secondary | ICD-10-CM | POA: Diagnosis not present

## 2022-10-15 MED ORDER — HYDROCOD POLI-CHLORPHE POLI ER 10-8 MG/5ML PO SUER: 5.0000 mL | Freq: Two times a day (BID) | ORAL | 0 refills | Status: DC

## 2022-10-15 MED ORDER — AMOXICILLIN 875 MG PO TABS: 875.0000 mg | ORAL_TABLET | Freq: Two times a day (BID) | ORAL | 0 refills | Status: DC

## 2022-10-15 MED ORDER — AMOXICILLIN 875 MG PO TABS: 875.0000 mg | ORAL_TABLET | Freq: Two times a day (BID) | ORAL | 0 refills | Status: AC

## 2022-10-15 NOTE — Progress Notes (Signed)
Subjective:     Patient ID: Hunter Gomez, male   DOB: 1971/04/23, 51 y.o.   MRN: [id number]  This visit type was conducted due to national recommendations for restrictions regarding the COVID-19 Pandemic (e.g. social distancing) in an effort to limit this patient's exposure and mitigate transmission in our community.  Due to their co-morbid illnesses, this patient is at least at moderate risk for complications without adequate follow up.  This format is felt to be most appropriate for this patient at this time.    Documentation for virtual audio and video telecommunications through [place] encounter:  The patient was located at home. The provider was located in the office. The patient did consent to this visit and is aware of possible charges through their insurance for this visit.  The other persons participating in this telemedicine service were none. Time spent on call was 20 minutes and in review of previous records 20 minutes total.  This virtual service is not related to other E/M service within previous 7 days.   HPI Chief Complaint  Patient presents with   sinus infection    Symptoms for x 4 days. Had hard time swallowing- no sore throat, drainage- eye sockets nasal and ear pain- coughing a lot    Virtual consult for illness.   He reports several day hx/o hard time swallowing, but no sore throat.  Clogged up in head and nose, some sinus pressure, ears pressure.  Coughing a lot, worse at night.  No NVD, no fever, negative covid test at home.  Using some tylenol, sudafed.  Tried some decongestant spray as well.  No sick contacts.  No other aggravating or relieving factors. No other complaint.  Past Medical History:  Diagnosis Date   CAD (coronary artery disease)    a.  NSTEMI (1/16):  LHC - dLM 95, oLAD 90, mLAD 30, oCFX 80-90, pCFX 50, oPDA 80, oPLA 75, EF 55-65% >> urgent CABG// Myoview 03/2020: EF 54, no ischemia or infarction; low risk   Hepatic steatosis    HLD  (hyperlipidemia)    Hx of echocardiogram    Echo (2/16):  Mild LVH, EF 55-60%, Gr 2 DD, trivial AI/TR, no effusion   Hypertension 10/2012   Muscle weakness 1997   prior seizure like activity, but none since; prior neurology eval, prior normal EEG 1997   Obesity    s/p Lap Band surgery   Vision problem    prior use of glasses, not currently   Current Outpatient Medications on File Prior to Visit  Medication Sig Dispense Refill   aspirin 81 MG tablet Take 81 mg by mouth daily.     Bempedoic Acid (NEXLETOL) 180 MG TABS Take 1 tablet by mouth daily. 90 tablet 3   clopidogrel (PLAVIX) 75 MG tablet Take 1 tablet (75 mg total) by mouth daily. 90 tablet 3   hydrochlorothiazide (HYDRODIURIL) 25 MG tablet Take 1 tablet (25 mg total) by mouth daily. 30 tablet 8   isosorbide mononitrate (IMDUR) 30 MG 24 hr tablet Take 0.5 tablets (15 mg total) by mouth daily. 45 tablet 3   lisinopril (ZESTRIL) 20 MG tablet Take 1 tablet (20 mg total) by mouth daily. 90 tablet 3   metoprolol tartrate (LOPRESSOR) 50 MG tablet TAKE ONE TABLET BY MOUTH TWICE A DAY WITH A MEAL 180 tablet 3   nitroGLYCERIN (NITROSTAT) 0.4 MG SL tablet Place 1 tablet (0.4 mg total) under the tongue every 5 (five) minutes as needed for chest pain. 30 tablet 6  simvastatin (ZOCOR) 40 MG tablet Take 1 tablet (40 mg total) by mouth at bedtime. 90 tablet 3   tamsulosin (FLOMAX) 0.4 MG CAPS capsule TAKE 1 CAPSULE(0.4 MG) BY MOUTH DAILY AFTER BREAKFAST 90 capsule 3   No current facility-administered medications on file prior to visit.       Review of Systems As in subjective    Objective:   Physical Exam Due to coronavirus pandemic stay at home measures, patient visit was virtual and they were not examined in person.   Temp 98.1 F (36.7 C)   Gen: wd, wn ,nad No obvious wheezing or labored breathing, seems congested       Assessment:     Encounter Diagnoses  Name Primary?   Acute non-recurrent frontal sinusitis Yes   Acute  cough        Plan:     Advised rest, hydration, nasal saline flush, begin antibiotic as below.  Can use over-the-counter Mucinex DM or can use the cough syrup I prescribed/next to help with cough and congestion.  This can cause sedation.  If not much improved in the next 3 to 4 days then call or recheck.  Advised to abstain from Sudafed or other oral or nasal decongestants given his other medications and heart condition.  Haitham was seen today for sinus infection.  Diagnoses and all orders for this visit:  Acute non-recurrent frontal sinusitis  Acute cough  Other orders -     Discontinue: amoxicillin (AMOXIL) 875 MG tablet; Take 1 tablet (875 mg total) by mouth 2 (two) times daily for 10 days. -     Discontinue: chlorpheniramine-HYDROcodone (TUSSIONEX) 10-8 MG/5ML; Take 5 mLs by mouth 2 (two) times daily. -     chlorpheniramine-HYDROcodone (TUSSIONEX) 10-8 MG/5ML; Take 5 mLs by mouth 2 (two) times daily. -     amoxicillin (AMOXIL) 875 MG tablet; Take 1 tablet (875 mg total) by mouth 2 (two) times daily for 10 days.  F/u prn

## 2022-11-04 ENCOUNTER — Encounter: Payer: Self-pay | Admitting: Cardiovascular Disease

## 2022-12-16 ENCOUNTER — Ambulatory Visit: Payer: 59 | Attending: Cardiovascular Disease

## 2022-12-16 DIAGNOSIS — E782 Mixed hyperlipidemia: Secondary | ICD-10-CM

## 2022-12-16 DIAGNOSIS — I25119 Atherosclerotic heart disease of native coronary artery with unspecified angina pectoris: Secondary | ICD-10-CM

## 2022-12-16 DIAGNOSIS — I10 Essential (primary) hypertension: Secondary | ICD-10-CM

## 2022-12-16 LAB — CBC

## 2022-12-17 LAB — LIPID PANEL
Chol/HDL Ratio: 4.8 ratio (ref 0.0–5.0)
Cholesterol, Total: 176 mg/dL (ref 100–199)
HDL: 37 mg/dL — ABNORMAL LOW (ref >39)
LDL Chol Calc (NIH): 103 mg/dL — ABNORMAL HIGH (ref 0–99)
Triglycerides: 206 mg/dL — ABNORMAL HIGH (ref 0–149)
VLDL Cholesterol Cal: 36 mg/dL (ref 5–40)

## 2022-12-17 LAB — COMPREHENSIVE METABOLIC PANEL
ALT: 21 IU/L (ref 0–44)
AST: 18 IU/L (ref 0–40)
Albumin/Globulin Ratio: 2.2 (ref 1.2–2.2)
Albumin: 4.8 g/dL (ref 3.8–4.9)
Alkaline Phosphatase: 87 IU/L (ref 44–121)
BUN/Creatinine Ratio: 16 (ref 9–20)
BUN: 14 mg/dL (ref 6–24)
Bilirubin Total: 0.5 mg/dL (ref 0.0–1.2)
CO2: 26 mmol/L (ref 20–29)
Calcium: 9.9 mg/dL (ref 8.7–10.2)
Chloride: 102 mmol/L (ref 96–106)
Creatinine, Ser: 0.89 mg/dL (ref 0.76–1.27)
Globulin, Total: 2.2 g/dL (ref 1.5–4.5)
Glucose: 128 mg/dL — ABNORMAL HIGH (ref 70–99)
Potassium: 4.7 mmol/L (ref 3.5–5.2)
Sodium: 142 mmol/L (ref 134–144)
Total Protein: 7 g/dL (ref 6.0–8.5)
eGFR: 104 mL/min/{1.73_m2} (ref >59)

## 2022-12-17 LAB — CBC
Hematocrit: 43.7 % (ref 37.5–51.0)
Hemoglobin: 14.5 g/dL (ref 13.0–17.7)
MCH: 28.3 pg (ref 26.6–33.0)
MCHC: 33.2 g/dL (ref 31.5–35.7)
MCV: 85 fL (ref 79–97)
Platelets: 315 10*3/uL (ref 150–450)
RBC: 5.12 x10E6/uL (ref 4.14–5.80)
RDW: 12.8 % (ref 11.6–15.4)
WBC: 9.2 10*3/uL (ref 3.4–10.8)

## 2022-12-19 ENCOUNTER — Telehealth: Payer: Self-pay | Admitting: Cardiovascular Disease

## 2022-12-19 DIAGNOSIS — E782 Mixed hyperlipidemia: Secondary | ICD-10-CM

## 2022-12-19 NOTE — Telephone Encounter (Signed)
-----  Message from Sherren Mocha, MD sent at 12/19/2022  6:35 AM EST ----- Notes reviewed. Issues with lipid lowering Rx because of cost and intolerances. Please refer to Lipid clinic for review of treatment options. thanks

## 2022-12-19 NOTE — Telephone Encounter (Signed)
Left detailed message per DPR letting him know referral placed to lipid clinic based on lab results and he will be called to schedule.

## 2022-12-24 ENCOUNTER — Other Ambulatory Visit (HOSPITAL_COMMUNITY): Payer: Self-pay

## 2023-02-16 ENCOUNTER — Telehealth: Payer: Self-pay | Admitting: Medical

## 2023-02-16 MED ORDER — TAMSULOSIN HCL 0.4 MG PO CAPS: ORAL_CAPSULE | ORAL | 0 refills | Status: DC

## 2023-02-16 NOTE — Telephone Encounter (Signed)
Pt called back and scheduled cpe for 03/25/23

## 2023-02-16 NOTE — Telephone Encounter (Signed)
Pharmacy sent refill request for flomax please send to the [facility]

## 2023-02-16 NOTE — Telephone Encounter (Signed)
Left message for pt to call back to schedule a visit as he is overdue 

## 2023-02-16 NOTE — Telephone Encounter (Signed)
refilled 

## 2023-03-25 ENCOUNTER — Ambulatory Visit (INDEPENDENT_AMBULATORY_CARE_PROVIDER_SITE_OTHER): Payer: 59 | Admitting: Medical

## 2023-03-25 ENCOUNTER — Encounter: Payer: Self-pay | Admitting: Medical

## 2023-03-25 VITALS — BP 120/72 | HR 69 | Ht 69.0 in | Wt 243.6 lb

## 2023-03-25 DIAGNOSIS — Z8249 Family history of ischemic heart disease and other diseases of the circulatory system: Secondary | ICD-10-CM

## 2023-03-25 DIAGNOSIS — Z125 Encounter for screening for malignant neoplasm of prostate: Secondary | ICD-10-CM | POA: Insufficient documentation

## 2023-03-25 DIAGNOSIS — R7301 Impaired fasting glucose: Secondary | ICD-10-CM | POA: Diagnosis not present

## 2023-03-25 DIAGNOSIS — Z0000 Encounter for general adult medical examination without abnormal findings: Secondary | ICD-10-CM | POA: Diagnosis not present

## 2023-03-25 DIAGNOSIS — I252 Old myocardial infarction: Secondary | ICD-10-CM

## 2023-03-25 DIAGNOSIS — Z87442 Personal history of urinary calculi: Secondary | ICD-10-CM

## 2023-03-25 DIAGNOSIS — I10 Essential (primary) hypertension: Secondary | ICD-10-CM

## 2023-03-25 DIAGNOSIS — I25119 Atherosclerotic heart disease of native coronary artery with unspecified angina pectoris: Secondary | ICD-10-CM

## 2023-03-25 DIAGNOSIS — Z951 Presence of aortocoronary bypass graft: Secondary | ICD-10-CM | POA: Diagnosis not present

## 2023-03-25 DIAGNOSIS — K760 Fatty (change of) liver, not elsewhere classified: Secondary | ICD-10-CM

## 2023-03-25 DIAGNOSIS — E785 Hyperlipidemia, unspecified: Secondary | ICD-10-CM | POA: Diagnosis not present

## 2023-03-25 LAB — POCT URINALYSIS DIP (PROADVANTAGE DEVICE)
Bilirubin, UA: NEGATIVE
Blood, UA: NEGATIVE
Glucose, UA: NEGATIVE mg/dL
Leukocytes, UA: NEGATIVE
Nitrite, UA: NEGATIVE
Protein Ur, POC: NEGATIVE mg/dL
Specific Gravity, Urine: 1.02
Urobilinogen, Ur: NEGATIVE
pH, UA: 6 (ref 5.0–8.0)

## 2023-03-25 NOTE — Progress Notes (Signed)
Subjective:   HPI  Hunter Gomez is a 52 y.o. male who presents for Chief Complaint  Patient presents with   fasting cpe    Fasting cpe, no concerns    Patient Care Team: Sharnell Knight, Kermit Balo, PA-C as PCP - General (Family Medicine) Tonny Bollman, MD as PCP - Cardiology (Cardiology) Tonny Bollman, MD as Consulting Physician (Cardiology) Kennon Rounds as Physician Assistant (Cardiology)   Concerns: Here for physical.  No recent issues  Reviewed their medical, surgical, family, social, medication, and allergy history and updated chart as appropriate.  Allergies  Allergen Reactions   Atorvastatin Diarrhea    Rosuvastatin also     Past Medical History:  Diagnosis Date   CAD (coronary artery disease)    a.  NSTEMI (1/16):  LHC - dLM 95, oLAD 90, mLAD 30, oCFX 80-90, pCFX 50, oPDA 80, oPLA 75, EF 55-65% >> urgent CABG// Myoview 03/2020: EF 54, no ischemia or infarction; low risk   Hepatic steatosis    HLD (hyperlipidemia)    Hx of echocardiogram    Echo (2/16):  Mild LVH, EF 55-60%, Gr 2 DD, trivial AI/TR, no effusion   Hypertension 10/2012   Muscle weakness 1997   prior seizure like activity, but none since; prior neurology eval, prior normal EEG 1997   Obesity    s/p Lap Band surgery   Vision problem    prior use of glasses, not currently    Current Outpatient Medications:    clopidogrel (PLAVIX) 75 MG tablet, Take 1 tablet (75 mg total) by mouth daily., Disp: 90 tablet, Rfl: 3   hydrochlorothiazide (HYDRODIURIL) 25 MG tablet, Take 1 tablet (25 mg total) by mouth daily., Disp: 30 tablet, Rfl: 8   isosorbide mononitrate (IMDUR) 30 MG 24 hr tablet, Take 0.5 tablets (15 mg total) by mouth daily., Disp: 45 tablet, Rfl: 3   lisinopril (ZESTRIL) 20 MG tablet, Take 1 tablet (20 mg total) by mouth daily., Disp: 90 tablet, Rfl: 3   metoprolol tartrate (LOPRESSOR) 50 MG tablet, TAKE ONE TABLET BY MOUTH TWICE A DAY WITH A MEAL, Disp: 180 tablet, Rfl: 3   nitroGLYCERIN  (NITROSTAT) 0.4 MG SL tablet, Place 1 tablet (0.4 mg total) under the tongue every 5 (five) minutes as needed for chest pain., Disp: 30 tablet, Rfl: 6   simvastatin (ZOCOR) 40 MG tablet, Take 1 tablet (40 mg total) by mouth at bedtime., Disp: 90 tablet, Rfl: 3   tamsulosin (FLOMAX) 0.4 MG CAPS capsule, TAKE 1 CAPSULE(0.4 MG) BY MOUTH DAILY AFTER BREAKFAST, Disp: 90 capsule, Rfl: 0   aspirin 81 MG tablet, Take 81 mg by mouth daily. (Patient not taking: Reported on 03/25/2023), Disp: , Rfl:   Family History  Problem Relation Age of Onset   Cancer Mother        lung   Diabetes Mother    Hypertension Mother    Hyperlipidemia Mother    Heart disease Mother 80       CABG   Peripheral vascular disease Mother    Hypertension Father    Hyperlipidemia Father    Heart disease Father 15       CABG   Stroke Father    Hypertension Brother    Hyperlipidemia Brother    Cancer Maternal Grandfather        stomach   Cancer Paternal Grandmother    Heart attack Neg Hx     Past Surgical History:  Procedure Laterality Date   CARDIAC CATHETERIZATION  CHOLECYSTECTOMY N/A 12/31/2016   Procedure: LAPAROSCOPIC CHOLECYSTECTOMY;  Surgeon: Harriette Bouillon, MD;  Location: [facility] OR;  Service: General;  Laterality: N/A;   CORONARY ARTERY BYPASS GRAFT N/A 12/18/2014   Procedure: CORONARY ARTERY BYPASS GRAFTING (CABG), ON PUMP, TIMES FIVE, USING BILATERAL INTERNAL MAMMARY ARTERIES, RIGHT GREATER SAPHENOUS VEIN HARVESTED ENDOSCOPICALLY;  Surgeon: Kerin Perna, MD;  Location: [facility] OR;  Service: Open Heart Surgery;  Laterality: N/A;  LIMA-LAD, SVG-D, SVG-OM, Free RIMA-Ramus, SVG-PD   CORONARY STENT INTERVENTION N/A 02/13/2021   Procedure: CORONARY STENT INTERVENTION;  Surgeon: Tonny Bollman, MD;  Location: [facility] INVASIVE CV LAB;  Service: Cardiovascular;  Laterality: N/A;   ENDOSCOPIC RETROGRADE CHOLANGIOPANCREATOGRAPHY (ERCP) WITH PROPOFOL N/A 12/29/2016   Procedure: ENDOSCOPIC RETROGRADE CHOLANGIOPANCREATOGRAPHY (ERCP) WITH  PROPOFOL;  Surgeon: Rachael Fee, MD;  Location: [facility] ENDOSCOPY;  Service: Endoscopy;  Laterality: N/A;   INTRAOPERATIVE TRANSESOPHAGEAL ECHOCARDIOGRAM N/A 12/18/2014   Procedure: INTRAOPERATIVE TRANSESOPHAGEAL ECHOCARDIOGRAM;  Surgeon: Kerin Perna, MD;  Location: [facility] OR;  Service: Open Heart Surgery;  Laterality: N/A;   LAPAROSCOPIC GASTRIC BANDING  2007   initial procedure Dr. Robb Matar in [place], adjustments in [place], [place]   LAPAROSCOPIC GASTRIC BANDING  2006   LEFT HEART CATH AND CORS/GRAFTS ANGIOGRAPHY N/A 02/13/2021   Procedure: LEFT HEART CATH AND CORS/GRAFTS ANGIOGRAPHY;  Surgeon: Tonny Bollman, MD;  Location: [facility] INVASIVE CV LAB;  Service: Cardiovascular;  Laterality: N/A;   LEFT HEART CATHETERIZATION WITH CORONARY ANGIOGRAM N/A 12/18/2014   Procedure: LEFT HEART CATHETERIZATION WITH CORONARY ANGIOGRAM;  Surgeon: Micheline Chapman, MD;  Location: [facility] CATH LAB;  Service: Cardiovascular;  Laterality: N/A;   TONSILLECTOMY     WISDOM TOOTH EXTRACTION      Review of Systems  Constitutional:  Negative for chills, fever, malaise/fatigue and weight loss.  HENT:  Negative for congestion, ear pain, hearing loss, sore throat and tinnitus.   Eyes:  Negative for blurred vision, pain and redness.  Respiratory:  Negative for cough, hemoptysis and shortness of breath.   Cardiovascular:  Negative for chest pain, palpitations, orthopnea, claudication and leg swelling.  Gastrointestinal:  Negative for abdominal pain, blood in stool, constipation, diarrhea, nausea and vomiting.  Genitourinary:  Negative for dysuria, flank pain, frequency, hematuria and urgency.  Musculoskeletal:  Negative for falls, joint pain and myalgias.  Skin:  Negative for itching and rash.  Neurological:  Negative for dizziness, tingling, speech change, weakness and headaches.  Endo/Heme/Allergies:  Negative for polydipsia. Does not bruise/bleed easily.  Psychiatric/Behavioral:  Negative for depression and memory loss. The  patient is not nervous/anxious and does not have insomnia.         Objective:  BP 120/72   Pulse 69   Ht 5\' 9"  (1.753 m)   Wt 243 lb 9.6 oz (110.5 kg)   BMI 35.97 kg/m   General appearance: alert, no distress, WD/WN, Caucasian male Skin: Unremarkable HEENT: normocephalic, conjunctiva/corneas normal, sclerae anicteric, PERRLA, EOMi, nares patent, no discharge or erythema, pharynx normal Oral cavity: MMM, tongue normal, teeth in good repair Neck: supple, no lymphadenopathy, no thyromegaly, no masses, normal ROM, no bruits Chest: Anterior surgical scars, non tender, normal shape and expansion Heart: RRR, normal S1, S2, no murmurs Lungs: CTA bilaterally, no wheezes, rhonchi, or rales Abdomen: +bs, soft, upper abdomen port surgical scars, non tender, non distended, no masses, no hepatomegaly, no splenomegaly, no bruits Back: non tender, normal ROM, no scoliosis Musculoskeletal: upper extremities non tender, no obvious deformity, normal ROM throughout, lower extremities non tender, no obvious deformity, normal ROM throughout  Extremities: no edema, no cyanosis, no clubbing Pulses: 2+ symmetric, upper and lower extremities, normal cap refill Neurological: alert, oriented x 3, CN2-12 intact, strength normal upper extremities and lower extremities, sensation normal throughout, DTRs 2+ throughout, no cerebellar signs, gait normal Psychiatric: normal affect, behavior normal, pleasant  GU/rectal-declined     Assessment and Plan :   Encounter Diagnoses  Name Primary?   Encounter for health maintenance examination in adult Yes   Coronary artery disease involving native coronary artery of native heart with angina pectoris (HCC)    Essential hypertension, benign    Hx of NSTEMI followed by CABG in 2016    Impaired fasting blood sugar    Dyslipidemia    S/P CABG x 5    Hepatic steatosis    Family history of peripheral arterial disease    History of renal stone    Screening for prostate  cancer     This visit was a preventative care visit, also known as wellness visit or routine physical.   Topics typically include healthy lifestyle, diet, exercise, preventative care, vaccinations, sick and well care, proper use of emergency dept and after hours care, as well as other concerns.     Separate significant issues discussed: Hypertension, hx/o NSTEMI, hx/o CABG - sees cardiology, compliant with medication  CAD, hyperlipidemia - compliant with medication  Impaired glucose - updated labs today    General Recommendations: Continue to return yearly for your annual wellness and preventative care visits.  This gives [place] a chance to discuss healthy lifestyle, exercise, vaccinations, review your chart record, and perform screenings where appropriate.  I recommend you see your eye doctor yearly for routine vision care.  I recommend you see your dentist yearly for routine dental care including hygiene visits twice yearly.   Vaccination  Immunization History  Administered Date(s) Administered   Influenza,inj,Quad PF,6+ Mos 09/09/2017   PFIZER(Purple Top)SARS-COV-2 Vaccination 02/02/2020, 02/23/2020, 10/25/2020   Pneumococcal Polysaccharide-23 09/09/2017   Tdap 10/25/2012, 05/05/2022    Vaccine recommendations: Shingrix, and he will consider   Screening for cancer: Colon cancer screening: Cologuard negative 2023  Prostate Cancer screening: The recommended prostate cancer screening test is a blood test called the prostate-specific antigen (PSA) test. PSA is a protein that is made in the prostate. As you age, your prostate naturally produces more PSA. Abnormally high PSA levels may be caused by: Prostate cancer. An enlarged prostate that is not caused by cancer (benign prostatic hyperplasia, or BPH). This condition is very common in older men. A prostate gland infection (prostatitis) or urinary tract infection. Certain medicines such as male hormones (like testosterone) or  other medicines that raise testosterone levels. A rectal exam may be done as part of prostate cancer screening to help provide information about the size of your prostate gland. When a rectal exam is performed, it should be done after the PSA level is drawn to avoid any effect on the results.   Skin cancer screening: Check your skin regularly for new changes, growing lesions, or other lesions of concern Come in for evaluation if you have skin lesions of concern.   Lung cancer screening: If you have a greater than 20 pack year history of tobacco use, then you may qualify for lung cancer screening with a chest CT scan.   Please call your insurance company to inquire about coverage for this test.   Pancreatic cancer:  no current screening test is available or routinely recommended. (risk factors: smoking, overweight or obese, diabetes, chronic pancreatitis,  work exposure - dry cleaning, metal working, 52yo>, M>F, Tree surgeon, family hx/o, hereditary breast, ovarian, melanoma, lynch, peutz-jeghers).  Symptoms: jaundice, dark urine, light color or greasy stools, itchy skin, belly or back pain, weight loss, poor appetite, nausea, vomiting, liver enlargement, DVT/blood clots.   We currently don't have screenings for other cancers besides breast, cervical, colon, and lung cancers.  If you have a strong family history of cancer or have other cancer screening concerns, please let me know.  Genetic testing referral is an option for individuals with high cancer risk in the family.  There are some other cancer screenings in development currently.   Bone health: Get at least 150 minutes of aerobic exercise weekly Get weight bearing exercise at least once weekly Bone density test:  A bone density test is an imaging test that uses a type of X-ray to measure the amount of calcium and other minerals in your bones. The test may be used to diagnose or screen you for a condition that causes weak or thin  bones (osteoporosis), predict your risk for a broken bone (fracture), or determine how well your osteoporosis treatment is working. The bone density test is recommended for females 65 and older, or females or males <65 if certain risk factors such as thyroid disease, long term use of steroids such as for asthma or rheumatological issues, vitamin D deficiency, estrogen deficiency, family history of osteoporosis, self or family history of fragility fracture in first degree relative.    Heart health: Get at least 150 minutes of aerobic exercise weekly Limit alcohol It is important to maintain a healthy blood pressure and healthy cholesterol numbers  Heart disease screening: Screening for heart disease includes screening for blood pressure, fasting lipids, glucose/diabetes screening, BMI height to weight ratio, reviewed of smoking status, physical activity, and diet.    Goals include blood pressure 120/80 or less, maintaining a healthy lipid/cholesterol profile, preventing diabetes or keeping diabetes numbers under good control, not smoking or using tobacco products, exercising most days per week or at least 150 minutes per week of exercise, and eating healthy variety of fruits and vegetables, healthy oils, and avoiding unhealthy food choices like fried food, fast food, high sugar and high cholesterol foods.      Vascular disease screening: For higher risk individuals including smokers, diabetics, patients with known heart disease or high blood pressure, kidney disease, and others, screening for vascular disease or atherosclerosis of the arteries is available.  Examples may include carotid ultrasound, abdominal aortic ultrasound, ABI blood flow screening in the legs, thoracic aorta screening.   Medical care options: I recommend you continue to seek care here first for routine care.  We try really hard to have available appointments Monday through Friday daytime hours for sick visits, acute visits,  and physicals.  Urgent care should be used for after hours and weekends for significant issues that cannot wait till the next day.  The emergency department should be used for significant potentially life-threatening emergencies.  The emergency department is expensive, can often have long wait times for less significant concerns, so try to utilize primary care, urgent care, or telemedicine when possible to avoid unnecessary trips to the emergency department.  Virtual visits and telemedicine have been introduced since the pandemic started in 2020, and can be convenient ways to receive medical care.  We offer virtual appointments as well to assist you in a variety of options to seek medical care.   Legal  Take the time to do a  last will and testament, Advanced Directives including Health Care Power of Attorney and Living Will documents.  Don't leave your family with burdens that can be handled ahead of time.   Advanced Directives: I recommend you consider completing a Health Care Power of Attorney and Living Will.   These documents respect your wishes and help alleviate burdens on your loved ones if you were to become terminally ill or be in a position to need those documents enforced.    You can complete Advanced Directives yourself, have them notarized, then have copies made for our office, for you and for anybody you feel should have them in safe keeping.  Or, you can have an attorney prepare these documents.   If you haven't updated your Last Will and Testament in a while, it may be worthwhile having an attorney prepare these documents together and save on some costs.       Spiritual and Emotional Health Keeping a healthy spiritual life can help you better manage your physical health. Your spiritual life can help you to cope with any issues that may arise with your physical health.  Balance can keep [place] healthy and help [place] to recover.  If you are struggling with your spiritual health there are  questions that you may want to ask yourself:  What makes me feel most complete? When do I feel most connected to the rest of the world? Where do I find the most inner strength? What am I doing when I feel whole?  Helpful tips: Being in nature. Some people feel very connected and at peace when they are walking outdoors or are outside. Helping others. Some feel the largest sense of wellbeing when they are of service to others. Being of service can take on many forms. It can be doing volunteer work, being kind to strangers, or offering a hand to a friend in need. Gratitude. Some people find they feel the most connected when they remain grateful. They may make lists of all the things they are grateful for or say a thank you out loud for all they have.    Emotional Health Are you in tune with your emotional health?  Check out this link: http://www.marquez-love.com/    Financial Health Make sure you use a budget for your personal finances Make sure you are insured against risks (health insurance, life insurance, auto insurance, etc) Save more, spend less Set financial goals If you need help in this area, good resources include counseling through [employer] or other community resources, have a meeting with a Social research officer, government, and a good resource is the [employer]    Corney was seen today for fasting cpe.  Diagnoses and all orders for this visit:  Encounter for health maintenance examination in adult -     PSA -     Hemoglobin A1c -     POCT Urinalysis DIP (Proadvantage Device)  Coronary artery disease involving native coronary artery of native heart with angina pectoris (HCC)  Essential hypertension, benign  Hx of NSTEMI followed by CABG in 2016  Impaired fasting blood sugar -     Hemoglobin A1c  Dyslipidemia  S/P CABG x 5  Hepatic steatosis  Family history of peripheral arterial disease  History of renal stone  Screening for prostate  cancer -     PSA     Follow-up pending labs, yearly for physical

## 2023-03-26 ENCOUNTER — Other Ambulatory Visit: Payer: Self-pay | Admitting: Medical

## 2023-03-26 LAB — HEMOGLOBIN A1C
Est. average glucose Bld gHb Est-mCnc: 163 mg/dL
Hgb A1c MFr Bld: 7.3 % — ABNORMAL HIGH (ref 4.8–5.6)

## 2023-03-26 LAB — PSA: Prostate Specific Ag, Serum: 1.2 ng/mL (ref 0.0–4.0)

## 2023-03-26 MED ORDER — TAMSULOSIN HCL 0.4 MG PO CAPS
ORAL_CAPSULE | ORAL | 3 refills | Status: DC
Start: 2023-03-26 — End: 2023-08-19

## 2023-03-26 MED ORDER — METFORMIN HCL 500 MG PO TABS: 500.0000 mg | ORAL_TABLET | Freq: Every day | ORAL | 1 refills | Status: DC

## 2023-03-26 NOTE — Progress Notes (Signed)
Results sent through MyChart

## 2023-04-27 ENCOUNTER — Encounter: Payer: Self-pay | Admitting: Cardiovascular Disease

## 2023-04-28 MED ORDER — HYDROCHLOROTHIAZIDE 25 MG PO TABS: 25.0000 mg | ORAL_TABLET | Freq: Every day | ORAL | 1 refills | Status: DC

## 2023-04-28 MED ORDER — METOPROLOL TARTRATE 50 MG PO TABS: ORAL_TABLET | ORAL | 1 refills | Status: DC

## 2023-04-28 MED ORDER — LISINOPRIL 20 MG PO TABS: 20.0000 mg | ORAL_TABLET | Freq: Every day | ORAL | 1 refills | Status: DC

## 2023-04-28 MED ORDER — ISOSORBIDE MONONITRATE ER 30 MG PO TB24: 15.0000 mg | ORAL_TABLET | Freq: Every day | ORAL | 1 refills | Status: DC

## 2023-04-28 NOTE — Telephone Encounter (Signed)
Per OV note by Excell Seltzer on 08/23/22: Blood pressure mildly elevated.  Patient is under a lot of stress with issues related to his son.  We will keep an eye on things for now.  Continue hydrochlorothiazide, isosorbide, lisinopril, and metoprolol.  Check labs in 3 to 4 months. The patient has been intolerant to atorvastatin and rosuvastatin because of diarrhea.  He is making a change in his insurance and he is no longer going to be able to afford PCSK9 inhibitors.  He would like to trial a different statin drug.  We will try him on simvastatin 40 mg daily with lipids and LFTs in 3 to 4 months.  He will follow-up in 1 year.    Sent refills to mail order pharmacy as requested to carry through until upcoming yearly appt scheduled in September. Pt made aware via MyChart.

## 2023-04-30 ENCOUNTER — Other Ambulatory Visit: Payer: Self-pay

## 2023-04-30 DIAGNOSIS — I25119 Atherosclerotic heart disease of native coronary artery with unspecified angina pectoris: Secondary | ICD-10-CM

## 2023-04-30 DIAGNOSIS — E782 Mixed hyperlipidemia: Secondary | ICD-10-CM

## 2023-04-30 DIAGNOSIS — I10 Essential (primary) hypertension: Secondary | ICD-10-CM

## 2023-04-30 MED ORDER — SIMVASTATIN 40 MG PO TABS: 40.0000 mg | ORAL_TABLET | Freq: Every day | ORAL | 0 refills | Status: DC

## 2023-05-01 ENCOUNTER — Other Ambulatory Visit: Payer: Self-pay

## 2023-05-01 ENCOUNTER — Other Ambulatory Visit: Payer: Self-pay | Admitting: Cardiovascular Disease

## 2023-05-01 DIAGNOSIS — I10 Essential (primary) hypertension: Secondary | ICD-10-CM

## 2023-05-01 DIAGNOSIS — E782 Mixed hyperlipidemia: Secondary | ICD-10-CM

## 2023-05-01 DIAGNOSIS — I25119 Atherosclerotic heart disease of native coronary artery with unspecified angina pectoris: Secondary | ICD-10-CM

## 2023-05-01 MED ORDER — CLOPIDOGREL BISULFATE 75 MG PO TABS: 75.0000 mg | ORAL_TABLET | Freq: Every day | ORAL | 0 refills | Status: DC

## 2023-05-07 ENCOUNTER — Telehealth: Payer: Self-pay | Admitting: Cardiovascular Disease

## 2023-05-07 NOTE — Telephone Encounter (Signed)
Called pt's pharmacy [facility] to inform them that pt has an allergic reaction to Atovastatin and that pt is taking Simvastatin to replace Repatha and to please refill pt's medication. Pharmacist verbalized understanding.

## 2023-05-07 NOTE — Telephone Encounter (Signed)
New Message:     Reference# [id number]    She called and wanted you to know that patient has an allergy to Simvastatin.

## 2023-08-19 ENCOUNTER — Encounter: Payer: Self-pay | Admitting: Cardiovascular Disease

## 2023-08-19 DIAGNOSIS — I25119 Atherosclerotic heart disease of native coronary artery with unspecified angina pectoris: Secondary | ICD-10-CM

## 2023-08-19 DIAGNOSIS — E782 Mixed hyperlipidemia: Secondary | ICD-10-CM

## 2023-08-19 DIAGNOSIS — I10 Essential (primary) hypertension: Secondary | ICD-10-CM

## 2023-08-19 MED ORDER — TAMSULOSIN HCL 0.4 MG PO CAPS: 0.4000 mg | ORAL_CAPSULE | Freq: Every day | ORAL | 0 refills | Status: DC

## 2023-08-19 MED ORDER — TAMSULOSIN HCL 0.4 MG PO CAPS
ORAL_CAPSULE | ORAL | 0 refills | Status: DC
Start: 2023-08-19 — End: 2023-10-28

## 2023-08-19 MED ORDER — SIMVASTATIN 40 MG PO TABS: 40.0000 mg | ORAL_TABLET | Freq: Every day | ORAL | 0 refills | Status: DC

## 2023-08-19 MED ORDER — CLOPIDOGREL BISULFATE 75 MG PO TABS: 75.0000 mg | ORAL_TABLET | Freq: Every day | ORAL | 0 refills | Status: DC

## 2023-08-24 ENCOUNTER — Other Ambulatory Visit (HOSPITAL_COMMUNITY): Payer: Self-pay

## 2023-08-24 ENCOUNTER — Telehealth: Payer: Self-pay | Admitting: Pharmacy Technician

## 2023-08-24 ENCOUNTER — Ambulatory Visit: Payer: 59 | Attending: Cardiovascular Disease | Admitting: Cardiovascular Disease

## 2023-08-24 ENCOUNTER — Encounter: Payer: Self-pay | Admitting: Pharmacist

## 2023-08-24 ENCOUNTER — Encounter: Payer: Self-pay | Admitting: Cardiovascular Disease

## 2023-08-24 VITALS — BP 114/76 | HR 57 | Ht 69.0 in | Wt 241.6 lb

## 2023-08-24 DIAGNOSIS — E782 Mixed hyperlipidemia: Secondary | ICD-10-CM | POA: Diagnosis not present

## 2023-08-24 DIAGNOSIS — I25119 Atherosclerotic heart disease of native coronary artery with unspecified angina pectoris: Secondary | ICD-10-CM

## 2023-08-24 DIAGNOSIS — I10 Essential (primary) hypertension: Secondary | ICD-10-CM

## 2023-08-24 MED ORDER — CLOPIDOGREL BISULFATE 75 MG PO TABS: 75.0000 mg | ORAL_TABLET | Freq: Every day | ORAL | 3 refills | Status: DC

## 2023-08-24 MED ORDER — SIMVASTATIN 40 MG PO TABS: 40.0000 mg | ORAL_TABLET | Freq: Every day | ORAL | 3 refills | Status: DC

## 2023-08-24 NOTE — Telephone Encounter (Signed)
Pharmacy Patient Advocate Encounter  Received notification from [facility] that Prior Authorization for repatha has been APPROVED from 08/24/23 to 08/23/24. Ran test claim, Copay is $35.00. This test claim was processed through [facility]- copay amounts may vary at other pharmacies due to pharmacy/plan contracts, or as the patient moves through the different stages of their insurance plan.   PA #/Case ID/Reference #: [id number]

## 2023-08-24 NOTE — Telephone Encounter (Signed)
-----   Message from Olene Floss sent at 08/24/2023 11:18 AM EDT ----- Regarding: FW: Repatha Can you all do a PR for Repatha with his new insurance? thanks ----- Message ----- From: Lars Mage, RN Sent: 08/24/2023  10:55 AM EDT To: [facility] Subject: Repatha                                        Saw this patient in clinic today with Dr Excell Seltzer. He was previously on Repatha, but had stopped it due to cost issues-has now changed insurances and wants to get back on it. Can you guys reach out to him to arrange this? I wasn't sure if it would require a lipid clinic visit to reinstate it.  Thank you for your help! Maralyn Sago

## 2023-08-24 NOTE — Telephone Encounter (Signed)
Pharmacy Patient Advocate Encounter   Received notification from Pt Calls Messages that prior authorization for repatha is required/requested.   Insurance verification completed.   The patient is insured through [facility] .   Per test claim: PA required; PA submitted to [facility] via CoverMyMeds Key/confirmation #/EOC [id number] Status is pending

## 2023-08-24 NOTE — Telephone Encounter (Signed)
Pharmacy Patient Advocate Encounter   Received notification from Pt Calls Messages that prior authorization for repatha is required/requested.   Insurance verification completed.   The patient is insured through [employer] [facility] .   Per test claim: PA required; PA started via CoverMyMeds. KEY [id number] . Waiting for clinical questions to populate.

## 2023-08-24 NOTE — Telephone Encounter (Signed)
Called and left detailed message per DPR.

## 2023-08-24 NOTE — Patient Instructions (Signed)
Medication Instructions:  Your physician recommends that you continue on your current medications as directed. Please refer to the Current Medication list given to you today.  *If you need a refill on your cardiac medications before your next appointment, please call your pharmacy*   Lab Work: NONE If you have labs (blood work) drawn today and your tests are completely normal, you will receive your results only by: MyChart Message (if you have MyChart) OR A paper copy in the mail If you have any lab test that is abnormal or we need to change your treatment, we will call you to review the results.   Testing/Procedures: PharmD will contact you regarding Repatha   Follow-Up: At [facility], you and your health needs are our priority.  As part of our continuing mission to provide you with exceptional heart care, we have created designated Provider Care Teams.  These Care Teams include your primary Cardiologist (physician) and Advanced Practice Providers (APPs -  Physician Assistants and Nurse Practitioners) who all work together to provide you with the care you need, when you need it.  Your next appointment:   1 year(s)  Provider:   Tonny Bollman, MD

## 2023-08-24 NOTE — Progress Notes (Signed)
Cardiology Office Note:    Date:  08/24/2023   ID:  [name], DOB 04-13-1971, MRN [id number]  PCP:  Jac Canavan, PA-C   [facility] Providers Cardiologist:  Tonny Bollman, MD Cardiology APP:  Kennon Rounds     Referring MD: Jac Canavan, PA-C   Chief Complaint  Patient presents with   Coronary Artery Disease    History of Present Illness:    Jaray Hesse is a 52 y.o. male with a hx ofL  Coronary artery disease S/p NSTEMI in 2016 >> s/p CABG Myoview 5/21: low risk  S/p DES to [facility] 01/2021 Cath: LM 100, oLAD 80, RI 60, pLCx 90, dRCA 90, RPDA 50, RPAV 50; L-LAD ok, S-D1 ok; S-OM 100, S-RI 100, RIMA-RCA 100 Hx of gallstone pancreatitis s/p ERCP, cholecystectomy Hyperlipidemia Hypertension  Echocardiogram 2/16: EF 55-60, Gr 2 DD  He's here alone today. Reports what he calls 'moderate angina' several months ago but this went away and he reports no recent problems. He is off of ASA and continues to take clopidogrel every day. Other medications are unchanged. He is tolerating simvastatin better than rosuvastatin or atorvastatin.  He denies shortness of breath, heart palpitations, edema, orthopnea, or PND.  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  Past Medical History:  Diagnosis Date   CAD (coronary artery disease)    a.  NSTEMI (1/16):  LHC - dLM 95, oLAD 90, mLAD 30, oCFX 80-90, pCFX 50, oPDA 80, oPLA 75, EF 55-65% >> urgent CABG// Myoview 03/2020: EF 54, no ischemia or infarction; low risk   Hepatic steatosis    HLD (hyperlipidemia)    Hx of echocardiogram    Echo (2/16):  Mild LVH, EF 55-60%, Gr 2 DD, trivial AI/TR, no effusion   Hypertension 10/2012   Muscle weakness 1997   prior seizure like activity, but none since; prior neurology eval, prior normal EEG 1997   Obesity    s/p Lap Band surgery   Vision problem    prior use of glasses, not currently    Past Surgical History:  Procedure Laterality Date   CARDIAC  CATHETERIZATION     CHOLECYSTECTOMY N/A 12/31/2016   Procedure: LAPAROSCOPIC CHOLECYSTECTOMY;  Surgeon: Harriette Bouillon, MD;  Location: [facility] OR;  Service: General;  Laterality: N/A;   CORONARY ARTERY BYPASS GRAFT N/A 12/18/2014   Procedure: CORONARY ARTERY BYPASS GRAFTING (CABG), ON PUMP, TIMES FIVE, USING BILATERAL INTERNAL MAMMARY ARTERIES, RIGHT GREATER SAPHENOUS VEIN HARVESTED ENDOSCOPICALLY;  Surgeon: Kerin Perna, MD;  Location: [facility] OR;  Service: Open Heart Surgery;  Laterality: N/A;  LIMA-LAD, SVG-D, SVG-OM, Free RIMA-Ramus, SVG-PD   CORONARY STENT INTERVENTION N/A 02/13/2021   Procedure: CORONARY STENT INTERVENTION;  Surgeon: Tonny Bollman, MD;  Location: [facility] INVASIVE CV LAB;  Service: Cardiovascular;  Laterality: N/A;   ENDOSCOPIC RETROGRADE CHOLANGIOPANCREATOGRAPHY (ERCP) WITH PROPOFOL N/A 12/29/2016   Procedure: ENDOSCOPIC RETROGRADE CHOLANGIOPANCREATOGRAPHY (ERCP) WITH PROPOFOL;  Surgeon: Rachael Fee, MD;  Location: [facility] ENDOSCOPY;  Service: Endoscopy;  Laterality: N/A;   INTRAOPERATIVE TRANSESOPHAGEAL ECHOCARDIOGRAM N/A 12/18/2014   Procedure: INTRAOPERATIVE TRANSESOPHAGEAL ECHOCARDIOGRAM;  Surgeon: Kerin Perna, MD;  Location: [facility] OR;  Service: Open Heart Surgery;  Laterality: N/A;   LAPAROSCOPIC GASTRIC BANDING  2007   initial procedure Dr. Robb Matar in [place], adjustments in [place], [place]   LAPAROSCOPIC GASTRIC BANDING  2006   LEFT HEART CATH AND CORS/GRAFTS ANGIOGRAPHY N/A 02/13/2021   Procedure: LEFT HEART CATH AND CORS/GRAFTS ANGIOGRAPHY;  Surgeon: Tonny Bollman, MD;  Location: [facility] INVASIVE CV LAB;  Service: Cardiovascular;  Laterality: N/A;   LEFT HEART CATHETERIZATION WITH CORONARY ANGIOGRAM N/A 12/18/2014   Procedure: LEFT HEART CATHETERIZATION WITH CORONARY ANGIOGRAM;  Surgeon: Micheline Chapman, MD;  Location: [facility] CATH LAB;  Service: Cardiovascular;  Laterality: N/A;   TONSILLECTOMY     WISDOM TOOTH EXTRACTION      Current Medications: Current Meds  Medication Sig    clopidogrel (PLAVIX) 75 MG tablet Take 1 tablet (75 mg total) by mouth daily.   hydrochlorothiazide (HYDRODIURIL) 25 MG tablet Take 1 tablet (25 mg total) by mouth daily.   isosorbide mononitrate (IMDUR) 30 MG 24 hr tablet Take 0.5 tablets (15 mg total) by mouth daily.   lisinopril (ZESTRIL) 20 MG tablet Take 1 tablet (20 mg total) by mouth daily.   metFORMIN (GLUCOPHAGE) 500 MG tablet Take 1 tablet (500 mg total) by mouth daily with breakfast.   metoprolol tartrate (LOPRESSOR) 50 MG tablet TAKE ONE TABLET BY MOUTH TWICE A DAY WITH A MEAL   nitroGLYCERIN (NITROSTAT) 0.4 MG SL tablet Place 1 tablet (0.4 mg total) under the tongue every 5 (five) minutes as needed for chest pain.   simvastatin (ZOCOR) 40 MG tablet Take 1 tablet (40 mg total) by mouth at bedtime.   tamsulosin (FLOMAX) 0.4 MG CAPS capsule TAKE 1 CAPSULE(0.4 MG) BY MOUTH DAILY AFTER BREAKFAST     Allergies:   Atorvastatin   Social History   Socioeconomic History   Marital status: Married    Spouse name: Not on file   Number of children: 3   Years of education: 16   Highest education level: Not on file  Occupational History   Not on file  Tobacco Use   Smoking status: Never   Smokeless tobacco: Never  Vaping Use   Vaping status: Never Used  Substance and Sexual Activity   Alcohol use: No   Drug use: No   Sexual activity: Not on file  Other Topics Concern   Not on file  Social History Narrative   Married now.  Prior separated, 3 children.   As of 03/2023   Social Determinants of Health   Financial Resource Strain: Not on file  Food Insecurity: Not on file  Transportation Needs: Not on file  Physical Activity: Not on file  Stress: Not on file  Social Connections: Not on file     Family History: The patient's family history includes Cancer in his maternal grandfather, mother, and paternal grandmother; Diabetes in his mother; Heart disease (age of onset: 72) in his father; Heart disease (age of onset: 30) in his  mother; Hyperlipidemia in his brother, father, and mother; Hypertension in his brother, father, and mother; Peripheral vascular disease in his mother; Stroke in his father. There is no history of Heart attack.  ROS:   Please see the history of present illness.    All other systems reviewed and are negative.  EKGs/Labs/Other Studies Reviewed:    The following studies were reviewed today: EKG Interpretation Date/Time:  Monday August 24 2023 10:07:54 EDT Ventricular Rate:  53 PR Interval:  172 QRS Duration:  96 QT Interval:  406 QTC Calculation: 380 R Axis:   37  Text Interpretation: Sinus bradycardia Possible Inferior infarct (cited on or before 13-Feb-2021) When compared with ECG of 13-Feb-2021 15:23, QT has shortened Otherwise no significant change Confirmed by Tonny Bollman [phone]) on 08/24/2023 10:19:00 AM    Recent Labs: 12/16/2022: ALT 21; BUN 14; Creatinine, Ser 0.89; Hemoglobin 14.5; Platelets 315;  Potassium 4.7; Sodium 142  Recent Lipid Panel    Component Value Date/Time   CHOL 176 12/16/2022 0811   TRIG 206 (H) 12/16/2022 0811   HDL 37 (L) 12/16/2022 0811   CHOLHDL 4.8 12/16/2022 0811   CHOLHDL 3.0 09/09/2017 1104   VLDL 31 12/27/2016 0912   LDLCALC 103 (H) 12/16/2022 0811   LDLCALC 59 09/09/2017 1104   Cardiac Cath 02/13/2021: 1.  Severe native vessel coronary artery disease with total occlusion of the left main and severe stenosis of the distal RCA 2.  Status post aortocoronary bypass surgery with continued patency of the LIMA to LAD and saphenous vein graft to diagonal.  The saphenous vein graft to diagonal supplies the ramus intermedius and native left circumflex in retrograde fashion.  Those vessels both have significant obstructive disease that is unapproachable because of left main total occlusion 3.  Total occlusion of the saphenous vein graft OM, saphenous vein graft to ramus intermedius, and free RIMA to RCA 4.  Successful PCI of the distal RCA, reducing 90%  stenosis to 0% with a 2.5 x 22 mm resolute Onyx DES   Recommendations: Same-day PCI protocol if criteria met, ongoing aggressive medical therapy, dual antiplatelet therapy with aspirin and clopidogrel at least 6 months (favor long-term if tolerated in this young patient with bypass graft failure and extensive CAD)  Risk Assessment/Calculations:                Physical Exam:    VS:  BP 114/76   Pulse (!) 57   Ht 5\' 9"  (1.753 m)   Wt 241 lb 9.6 oz (109.6 kg)   SpO2 97%   BMI 35.68 kg/m     Wt Readings from Last 3 Encounters:  08/24/23 241 lb 9.6 oz (109.6 kg)  03/25/23 243 lb 9.6 oz (110.5 kg)  08/13/22 235 lb (106.6 kg)     GEN:  Well nourished, well developed in no acute distress HEENT: Normal NECK: No JVD; No carotid bruits LYMPHATICS: No lymphadenopathy CARDIAC: RRR, no murmurs, rubs, gallops RESPIRATORY:  Clear to auscultation without rales, wheezing or rhonchi  ABDOMEN: Soft, non-tender, non-distended MUSCULOSKELETAL:  No edema; No deformity  SKIN: Warm and dry NEUROLOGIC:  Alert and oriented x 3 PSYCHIATRIC:  Normal affect   ASSESSMENT:    1. Essential hypertension   2. Mixed hyperlipidemia   3. Coronary artery disease involving native coronary artery of native heart with angina pectoris (HCC)    PLAN:    In order of problems listed above:  Blood pressure under ideal control on hydrochlorothiazide, isosorbide, lisinopril, and metoprolol.  Continue current management.  Last labs reviewed with potassium of 4.7 and a creatinine of 0.89. LDL cholesterol is above goal.  He has not been able to tolerate atorvastatin or rosuvastatin because of diarrhea.  He seems to be doing okay on simvastatin.  He now has a different insurance coverage and I will refer him back to lipid clinic to resume a PCSK9 inhibitor.  He had tolerated this in the past but it became cost prohibitive for him. Appears clinically stable on clopidogrel for antiplatelet monotherapy, isosorbide and  metoprolol for antianginal therapy.  See above regarding lipid lowering.  I will have him follow-up in 1 year.  Last cath from 2022 reviewed as above with demonstration of early bypass graft failure and PCI of the native RCA.       Medication Adjustments/Labs and Tests Ordered: Current medicines are reviewed at length with the patient today.  Concerns  regarding medicines are outlined above.  Orders Placed This Encounter  Procedures   EKG 12-Lead   No orders of the defined types were placed in this encounter.   There are no Patient Instructions on file for this visit.   Signed, Tonny Bollman, MD  08/24/2023 10:23 AM    [facility]

## 2023-08-25 MED ORDER — CLOPIDOGREL BISULFATE 75 MG PO TABS: 75.0000 mg | ORAL_TABLET | Freq: Every day | ORAL | 0 refills | Status: DC

## 2023-08-25 MED ORDER — SIMVASTATIN 40 MG PO TABS: 40.0000 mg | ORAL_TABLET | Freq: Every day | ORAL | 0 refills | Status: DC

## 2023-08-25 MED ORDER — REPATHA SURECLICK 140 MG/ML ~~LOC~~ SOAJ: 1.0000 mL | SUBCUTANEOUS | 3 refills | Status: DC

## 2023-09-14 ENCOUNTER — Encounter: Payer: Self-pay | Admitting: Cardiovascular Disease

## 2023-09-15 ENCOUNTER — Other Ambulatory Visit: Payer: Self-pay

## 2023-09-15 MED ORDER — METFORMIN HCL 500 MG PO TABS: 500.0000 mg | ORAL_TABLET | Freq: Every day | ORAL | 1 refills | Status: DC

## 2023-10-26 ENCOUNTER — Other Ambulatory Visit: Payer: Self-pay | Admitting: Cardiovascular Disease

## 2023-10-26 ENCOUNTER — Encounter: Payer: Self-pay | Admitting: Cardiovascular Disease

## 2023-10-26 MED ORDER — LISINOPRIL 20 MG PO TABS: 20.0000 mg | ORAL_TABLET | Freq: Every day | ORAL | 3 refills | Status: DC

## 2023-10-26 MED ORDER — ISOSORBIDE MONONITRATE ER 30 MG PO TB24: 15.0000 mg | ORAL_TABLET | Freq: Every day | ORAL | 3 refills | Status: DC

## 2023-10-26 MED ORDER — HYDROCHLOROTHIAZIDE 25 MG PO TABS: 25.0000 mg | ORAL_TABLET | Freq: Every day | ORAL | 3 refills | Status: DC

## 2023-10-27 ENCOUNTER — Other Ambulatory Visit: Payer: Self-pay | Admitting: Medical

## 2023-10-30 NOTE — Telephone Encounter (Signed)
Called [facility] and spoke w pharmacist-- advised okay to split isosorbide per Dr. Excell Seltzer.   They will process the order now and get med ready to ship.

## 2024-01-03 ENCOUNTER — Other Ambulatory Visit: Payer: Self-pay | Admitting: Medical

## 2024-01-04 ENCOUNTER — Encounter: Payer: Self-pay | Admitting: Cardiovascular Disease

## 2024-01-04 MED ORDER — METOPROLOL TARTRATE 50 MG PO TABS: ORAL_TABLET | ORAL | 2 refills | Status: DC

## 2024-01-07 ENCOUNTER — Ambulatory Visit (INDEPENDENT_AMBULATORY_CARE_PROVIDER_SITE_OTHER): Payer: 59 | Admitting: Medical

## 2024-01-07 ENCOUNTER — Other Ambulatory Visit (HOSPITAL_COMMUNITY): Payer: Self-pay

## 2024-01-07 ENCOUNTER — Telehealth: Payer: Self-pay

## 2024-01-07 VITALS — BP 120/80 | HR 64 | Wt 249.4 lb

## 2024-01-07 DIAGNOSIS — Z951 Presence of aortocoronary bypass graft: Secondary | ICD-10-CM

## 2024-01-07 DIAGNOSIS — E785 Hyperlipidemia, unspecified: Secondary | ICD-10-CM | POA: Diagnosis not present

## 2024-01-07 DIAGNOSIS — I10 Essential (primary) hypertension: Secondary | ICD-10-CM

## 2024-01-07 DIAGNOSIS — E1165 Type 2 diabetes mellitus with hyperglycemia: Secondary | ICD-10-CM | POA: Diagnosis not present

## 2024-01-07 DIAGNOSIS — K760 Fatty (change of) liver, not elsewhere classified: Secondary | ICD-10-CM

## 2024-01-07 DIAGNOSIS — I25119 Atherosclerotic heart disease of native coronary artery with unspecified angina pectoris: Secondary | ICD-10-CM | POA: Diagnosis not present

## 2024-01-07 LAB — LIPID PANEL

## 2024-01-07 MED ORDER — OZEMPIC (1 MG/DOSE) 2 MG/1.5ML ~~LOC~~ SOPN: 1.0000 mg | PEN_INJECTOR | SUBCUTANEOUS | 0 refills | Status: DC

## 2024-01-07 MED ORDER — OZEMPIC (0.25 OR 0.5 MG/DOSE) 2 MG/1.5ML ~~LOC~~ SOPN: 0.2500 mg | PEN_INJECTOR | SUBCUTANEOUS | 0 refills | Status: DC

## 2024-01-07 MED ORDER — OZEMPIC (0.25 OR 0.5 MG/DOSE) 2 MG/3ML ~~LOC~~ SOPN: 0.5000 mg | PEN_INJECTOR | SUBCUTANEOUS | 0 refills | Status: DC

## 2024-01-07 NOTE — Progress Notes (Signed)
Subjective:  Hunter Gomez is a 53 y.o. male who presents for Chief Complaint  Patient presents with   Consult    Discuss diabetes medication options would like to try ozempic     Here for med check.  His brother has similar health issues and has done well on ozempic . He wants to try this.    Overall doing ok.  Compliant with metformin.  No polyuria, no polydipsia, no blurred vision.  No current glucometer.  Exercise - has a gym  in his house.  Tries to exercise regularly  Is trying to eat healthy choices.   Compliant with medications  Regarding his lipids he is on the Repatha injection.  He thinks he is not currently on simvastatin that he has to check with his medicines at home.  He has not done well with other statins.  He is compliant with Plavix.  He is compliant with his lisinopril and hydrochlorothiazide and metoprolol blood pressure medicines  Compliant with Flomax  Compliant with Imdur.  No other aggravating or relieving factors.    No other c/o.  Past Medical History:  Diagnosis Date   CAD (coronary artery disease)    a.  NSTEMI (1/16):  LHC - dLM 95, oLAD 90, mLAD 30, oCFX 80-90, pCFX 50, oPDA 80, oPLA 75, EF 55-65% >> urgent CABG// Myoview 03/2020: EF 54, no ischemia or infarction; low risk   Hepatic steatosis    HLD (hyperlipidemia)    Hx of echocardiogram    Echo (2/16):  Mild LVH, EF 55-60%, Gr 2 DD, trivial AI/TR, no effusion   Hypertension 10/2012   Muscle weakness 1997   prior seizure like activity, but none since; prior neurology eval, prior normal EEG 1997   Obesity    s/p Lap Band surgery   Vision problem    prior use of glasses, not currently   Current Outpatient Medications on File Prior to Visit  Medication Sig Dispense Refill   clopidogrel (PLAVIX) 75 MG tablet TAKE 1 TABLET BY MOUTH DAILY 90 tablet 3   Evolocumab (REPATHA SURECLICK) 140 MG/ML SOAJ Inject 140 mg into the skin every 14 (fourteen) days. 6 mL 3   hydrochlorothiazide (HYDRODIURIL)  25 MG tablet Take 1 tablet (25 mg total) by mouth daily. 90 tablet 3   isosorbide mononitrate (IMDUR) 30 MG 24 hr tablet Take 0.5 tablets (15 mg total) by mouth daily. 45 tablet 3   lisinopril (ZESTRIL) 20 MG tablet Take 1 tablet (20 mg total) by mouth daily. 90 tablet 3   metFORMIN (GLUCOPHAGE) 500 MG tablet TAKE 1 TABLET BY MOUTH DAILY  WITH BREAKFAST 90 tablet 0   metoprolol tartrate (LOPRESSOR) 50 MG tablet TAKE ONE TABLET BY MOUTH TWICE A DAY WITH A MEAL 180 tablet 2   simvastatin (ZOCOR) 40 MG tablet Take 1 tablet (40 mg total) by mouth at bedtime. 10 tablet 0   tamsulosin (FLOMAX) 0.4 MG CAPS capsule TAKE 1 CAPSULE BY MOUTH DAILY  AFTER BREAKFAST 90 capsule 1   nitroGLYCERIN (NITROSTAT) 0.4 MG SL tablet Place 1 tablet (0.4 mg total) under the tongue every 5 (five) minutes as needed for chest pain. 30 tablet 6   No current facility-administered medications on file prior to visit.     The following portions of the patient's history were reviewed and updated as appropriate: allergies, current medications, past family history, past medical history, past social history, past surgical history and problem list.  ROS Otherwise as in subjective above  Objective: BP 120/80   Pulse 64   Wt 249 lb 6.4 oz (113.1 kg)   BMI 36.83 kg/m   Wt Readings from Last 3 Encounters:  01/07/24 249 lb 6.4 oz (113.1 kg)  08/24/23 241 lb 9.6 oz (109.6 kg)  03/25/23 243 lb 9.6 oz (110.5 kg)   General appearance: alert, no distress, well developed, well nourished Neck: supple, no lymphadenopathy, no thyromegaly, no masses Heart: RRR, normal S1, S2, no murmurs Lungs: CTA bilaterally, no wheezes, rhonchi, or rales Pulses: 2+ radial pulses, 2+ pedal pulses, normal cap refill Ext: no edema   Assessment: Encounter Diagnoses  Name Primary?   Type 2 diabetes mellitus with hyperglycemia, without long-term current use of insulin (HCC) Yes   Coronary artery disease involving native coronary artery of  native heart with angina pectoris (HCC)    Dyslipidemia    Essential hypertension, benign    Hepatic steatosis    S/P CABG x 5      Plan: Diabetes Begin trial of Ozempic.  Begin 0.25 mg weekly for the first month, then increase to 0.5 mg weekly for a month, and then increase to 1 mg weekly for the third month. Start checking her blood sugars a couple days per week fasting in the morning.  The goal is to see between 80 and 130 fasting If you start seeing numbers below 80 as you start the Ozempic then you would need to discontinue metformin If you feel weak or clammy or lightheaded check your sugar to make sure you are not too low.  If you do see low readings drink some orange juice or some other quick source of sugar, wait 15 minutes and then check your sugar again to make sure it is increasing above 80 We discussed diet and exercise today   History of fatty liver Work on efforts to lose weight through healthy diet and exercise Begin Ozempic   Hypertension Continue lisinopril 20 mg daily continue hydrochlorothiazide 25 mg daily Continue metoprolol 50 mg twice daily  Hyperlipidemia Updated labs today Continue Repatha injection every 14 days He will double check his medicines at home but we think he is not currently on statin.  He has not tolerated other statins in the past  History of coronary artery disease, history of CABG Continue Plavix 75 mg daily Continue Imdur 15 mg daily Continue nitroglycerin as needed Ozempic which has a heart benefit use and risk of overall nonfatal MI and stroke    Hunter Gomez was seen today for consult.  Diagnoses and all orders for this visit:  Type 2 diabetes mellitus with hyperglycemia, without long-term current use of insulin (HCC) -     Lipid panel -     Hemoglobin A1c  Coronary artery disease involving native coronary artery of native heart with angina pectoris (HCC) -     Lipid panel -     Hemoglobin A1c  Dyslipidemia -     Lipid  panel -     Hemoglobin A1c  Essential hypertension, benign  Hepatic steatosis -     Lipid panel -     Hemoglobin A1c  S/P CABG x 5 -     Lipid panel -     Hemoglobin A1c  Other orders -     Semaglutide,0.25 or 0.5MG /DOS, (OZEMPIC, 0.25 OR 0.5 MG/DOSE,) 2 MG/1.5ML SOPN; Inject 0.25 mg into the skin once a week. -     Semaglutide,0.25 or 0.5MG /DOS, (OZEMPIC, 0.25 OR 0.5 MG/DOSE,) 2 MG/3ML SOPN; Inject 0.5 mg into  the skin once a week. -     Semaglutide, 1 MG/DOSE, (OZEMPIC, 1 MG/DOSE,) 2 MG/1.5ML SOPN; Inject 1 mg into the skin once a week.   Follow up: 2-3 months

## 2024-01-07 NOTE — Telephone Encounter (Signed)
Pharmacy Patient Advocate Encounter   Received notification from CoverMyMeds that prior authorization for Ozempic (0.25 or 0.5 MG/DOSE) 2MG /3ML pen-injectors is required/requested.   Insurance verification completed.   The patient is insured through [facility] .   Per test claim: PA required; PA submitted to above mentioned insurance via CoverMyMeds Key/confirmation #/EOC (Key: [id number])   Status is pending

## 2024-01-08 ENCOUNTER — Other Ambulatory Visit (HOSPITAL_COMMUNITY): Payer: Self-pay

## 2024-01-08 ENCOUNTER — Other Ambulatory Visit: Payer: Self-pay | Admitting: Internal Medicine

## 2024-01-08 LAB — HEMOGLOBIN A1C
Est. average glucose Bld gHb Est-mCnc: 148 mg/dL
Hgb A1c MFr Bld: 6.8 % — ABNORMAL HIGH (ref 4.8–5.6)

## 2024-01-08 LAB — LIPID PANEL
Cholesterol, Total: 180 mg/dL (ref 100–199)
HDL: 40 mg/dL (ref >39)
LDL CALC COMMENT:: 4.5 ratio (ref 0.0–5.0)
LDL Chol Calc (NIH): 95 mg/dL (ref 0–99)
Triglycerides: 264 mg/dL — ABNORMAL HIGH (ref 0–149)
VLDL Cholesterol Cal: 45 mg/dL — ABNORMAL HIGH (ref 5–40)

## 2024-01-08 NOTE — Progress Notes (Signed)
Results sent through MyChart

## 2024-01-08 NOTE — Telephone Encounter (Signed)
Pharmacy Patient Advocate Encounter  Received notification from [facility] that Prior Authorization for Ozempic (0.25 or 0.5 MG/DOSE) 2MG /3ML pen-injectors has been APPROVED from 2.13.25 to 2.13.26. Ran test claim, Copay is $RTS, RX will be payable again on/after 3.8.25. This test claim was processed through [facility] copay amounts may vary at other pharmacies due to pharmacy/plan contracts, or as the patient moves through the different stages of their insurance plan.   PA #/Case ID/Reference #: (Key: [id number])

## 2024-02-14 ENCOUNTER — Other Ambulatory Visit: Payer: Self-pay | Admitting: Medical

## 2024-03-13 ENCOUNTER — Other Ambulatory Visit: Payer: Self-pay | Admitting: Medical

## 2024-03-25 ENCOUNTER — Encounter: Payer: 59 | Admitting: Medical

## 2024-03-25 ENCOUNTER — Encounter: Payer: Self-pay | Admitting: Medical

## 2024-03-25 DIAGNOSIS — Z0000 Encounter for general adult medical examination without abnormal findings: Secondary | ICD-10-CM

## 2024-03-25 NOTE — Telephone Encounter (Unsigned)
 Copied from CRM [id number]. Topic: Clinical - Medication Question >> Mar 25, 2024  8:21 AM Hunter Gomez wrote: Reason for CRM: Patient had to reschedule his appointment from 03/25/2024 to 06/13/2024. This appointment was to check on the Ozempic  prescribed. Will you refill the Ozempic  until the new appointment date? Callback number is [phone]

## 2024-03-31 ENCOUNTER — Other Ambulatory Visit: Payer: Self-pay | Admitting: Medical

## 2024-03-31 MED ORDER — OZEMPIC (1 MG/DOSE) 2 MG/1.5ML ~~LOC~~ SOPN: 1.0000 mg | PEN_INJECTOR | SUBCUTANEOUS | 0 refills | Status: DC

## 2024-04-04 MED ORDER — OZEMPIC (1 MG/DOSE) 2 MG/1.5ML ~~LOC~~ SOPN: 1.0000 mg | PEN_INJECTOR | SUBCUTANEOUS | 0 refills | Status: DC

## 2024-04-09 ENCOUNTER — Other Ambulatory Visit: Payer: Self-pay | Admitting: Medical

## 2024-04-10 ENCOUNTER — Other Ambulatory Visit: Payer: Self-pay | Admitting: Medical

## 2024-04-19 ENCOUNTER — Ambulatory Visit (INDEPENDENT_AMBULATORY_CARE_PROVIDER_SITE_OTHER): Admitting: Medical

## 2024-04-19 VITALS — BP 108/64 | HR 85 | Temp 98.8°F | Wt 231.4 lb

## 2024-04-19 DIAGNOSIS — Z87442 Personal history of urinary calculi: Secondary | ICD-10-CM

## 2024-04-19 DIAGNOSIS — R350 Frequency of micturition: Secondary | ICD-10-CM

## 2024-04-19 DIAGNOSIS — K529 Noninfective gastroenteritis and colitis, unspecified: Secondary | ICD-10-CM | POA: Diagnosis not present

## 2024-04-19 DIAGNOSIS — I10 Essential (primary) hypertension: Secondary | ICD-10-CM | POA: Diagnosis not present

## 2024-04-19 DIAGNOSIS — R309 Painful micturition, unspecified: Secondary | ICD-10-CM

## 2024-04-19 LAB — POCT URINALYSIS DIP (PROADVANTAGE DEVICE)
Bilirubin, UA: NEGATIVE
Blood, UA: NEGATIVE
Glucose, UA: NEGATIVE mg/dL
Nitrite, UA: NEGATIVE
Protein Ur, POC: 30 mg/dL — AB
Specific Gravity, Urine: 1.02
Urobilinogen, Ur: NEGATIVE
pH, UA: 6 (ref 5.0–8.0)

## 2024-04-19 MED ORDER — SULFAMETHOXAZOLE-TRIMETHOPRIM 800-160 MG PO TABS: 1.0000 | ORAL_TABLET | Freq: Two times a day (BID) | ORAL | 0 refills | Status: DC

## 2024-04-19 MED ORDER — FREESTYLE LIBRE 3 SENSOR MISC: 1.0000 | 5 refills | Status: DC

## 2024-04-19 NOTE — Patient Instructions (Signed)
 We discussed symptoms and concerns and possible differential  Differential could include urinary tract infection, prostate infection, urinary retention, kidney stone, or other  Given the abrupt onset of symptoms lets begin Bactrim antibiotic for possible infection  We will send urine culture which takes about 3 days to get a result.  Given your weight loss on ozempic  and lower blood pressure, lets make some medication changes:  Temporarily stop Hydrochlorothiazide  25mg .   Temporarily stop Metformin  500mg  daily  Continue rest of medicaiton as usual  Continue good water intake  Monitor blood pressures.  Goal 120/70.   Low BP would be 108/60 or less  I sent new prescription for freestyle libre device to monitor glucose.

## 2024-04-19 NOTE — Progress Notes (Signed)
 Subjective:  Hunter Gomez is a 53 y.o. male who presents for Chief Complaint  Patient presents with   Acute Visit    Frequent urination, with painful urination. Every 10-15 minutes today but not having a lot of flow     Here for frequent urination.  Started yesterday morning.  Having urinary frequency, numerous times in past 24 hours, urgency, small volume when it comes out, and has pressure/pain with urination.  No pain in rectum.  No testicle pain or swelling, no back pain.   Has had kidney stone in the past.  No polydipsia.    Since beginning Flomax  in past, this resolved prior urinary concerns he had in the past  He notes years of chronic diarrhea  No hx/o urinary or prostate infection.  No concern for STD.    Married.  Separated.  No new partners.   No other aggravating or relieving factors.    No other c/o.   Past Medical History:  Diagnosis Date   CAD (coronary artery disease)    a.  NSTEMI (1/16):  LHC - dLM 95, oLAD 90, mLAD 30, oCFX 80-90, pCFX 50, oPDA 80, oPLA 75, EF 55-65% >> urgent CABG// Myoview  03/2020: EF 54, no ischemia or infarction; low risk   Hepatic steatosis    HLD (hyperlipidemia)    Hx of echocardiogram    Echo (2/16):  Mild LVH, EF 55-60%, Gr 2 DD, trivial AI/TR, no effusion   Hypertension 10/2012   Muscle weakness 1997   prior seizure like activity, but none since; prior neurology eval, prior normal EEG 1997   Obesity    s/p Lap Band surgery   Vision problem    prior use of glasses, not currently   Current Outpatient Medications on File Prior to Visit  Medication Sig Dispense Refill   clopidogrel  (PLAVIX ) 75 MG tablet TAKE 1 TABLET BY MOUTH DAILY 90 tablet 3   Evolocumab  (REPATHA  SURECLICK) 140 MG/ML SOAJ Inject 140 mg into the skin every 14 (fourteen) days. 6 mL 3   hydrochlorothiazide  (HYDRODIURIL ) 25 MG tablet Take 1 tablet (25 mg total) by mouth daily. 90 tablet 3   isosorbide  mononitrate (IMDUR ) 30 MG 24 hr tablet Take 0.5 tablets (15 mg  total) by mouth daily. 45 tablet 3   lisinopril  (ZESTRIL ) 20 MG tablet Take 1 tablet (20 mg total) by mouth daily. 90 tablet 3   metFORMIN  (GLUCOPHAGE ) 500 MG tablet TAKE 1 TABLET BY MOUTH DAILY  WITH BREAKFAST 90 tablet 0   metoprolol  tartrate (LOPRESSOR ) 50 MG tablet TAKE ONE TABLET BY MOUTH TWICE A DAY WITH A MEAL 180 tablet 2   Semaglutide , 1 MG/DOSE, (OZEMPIC , 1 MG/DOSE,) 2 MG/1.5ML SOPN Inject 1 mg into the skin once a week. 1.5 mL 0   tamsulosin  (FLOMAX ) 0.4 MG CAPS capsule TAKE 1 CAPSULE BY MOUTH DAILY  AFTER BREAKFAST 90 capsule 0   nitroGLYCERIN  (NITROSTAT ) 0.4 MG SL tablet Place 1 tablet (0.4 mg total) under the tongue every 5 (five) minutes as needed for chest pain. 30 tablet 6   No current facility-administered medications on file prior to visit.      The following portions of the patient's history were reviewed and updated as appropriate: allergies, current medications, past family history, past medical history, past social history, past surgical history and problem list.  ROS Otherwise as in subjective above  Objective: BP 108/64   Pulse 85   Temp 98.8 F (37.1 C)   Wt 231 lb 6.4 oz (105 kg)  BMI 34.17 kg/m   BP Readings from Last 3 Encounters:  04/19/24 108/64  01/07/24 120/80  08/24/23 114/76   Wt Readings from Last 3 Encounters:  04/19/24 231 lb 6.4 oz (105 kg)  01/07/24 249 lb 6.4 oz (113.1 kg)  08/24/23 241 lb 9.6 oz (109.6 kg)    General appearance: alert, no distress, well developed, well nourished Abdomen: +bs, soft, non tender, non distended, no masses, no hepatomegaly, no splenomegaly Back: nontender GU: normal male, circ, no mass, no lymphadenopathy, no swelling Pulses: 2+ radial pulses, 2+ pedal pulses, normal cap refill Ext: no edema   Assessment: Encounter Diagnoses  Name Primary?   Painful urination Yes   Frequent urination    Essential hypertension, benign    Chronic diarrhea    History of kidney stones      Plan: We discussed  symptoms and concerns and possible differential  Differential could include urinary tract infection, prostate infection, urinary retention, kidney stone, or other  Given the abrupt onset of symptoms lets begin Bactrim antibiotic for possible infection  We will send urine culture which takes about 3 days to get a result.  Given your weight loss on ozempic  and lower blood pressure, lets make some medication changes:  Temporarily stop Hydrochlorothiazide  25mg .   Temporarily stop Metformin  500mg  daily  Continue rest of medicaiton as usual  Continue good water intake  Monitor blood pressures.  Goal 120/70.   Low BP would be 108/60 or less  I sent new prescription for freestyle libre device to monitor glucose.    Clifford was seen today for acute visit.  Diagnoses and all orders for this visit:  Painful urination -     POCT Urinalysis DIP (Proadvantage Device) -     Urine Culture  Frequent urination -     POCT Urinalysis DIP (Proadvantage Device) -     Urine Culture  Essential hypertension, benign  Chronic diarrhea  History of kidney stones  Other orders -     sulfamethoxazole-trimethoprim (BACTRIM DS) 800-160 MG tablet; Take 1 tablet by mouth 2 (two) times daily. -     Continuous Glucose Sensor (FREESTYLE LIBRE 3 SENSOR) MISC; 1 each by Does not apply route every 14 (fourteen) days. Place 1 sensor on the skin every 14 days. Use to check glucose continuously    Follow up: pending culture

## 2024-04-20 ENCOUNTER — Telehealth: Payer: Self-pay

## 2024-04-20 ENCOUNTER — Other Ambulatory Visit (HOSPITAL_COMMUNITY): Payer: Self-pay

## 2024-04-20 LAB — URINE CULTURE: Organism ID, Bacteria: NO GROWTH

## 2024-04-20 NOTE — Telephone Encounter (Signed)
 Pharmacy Patient Advocate Encounter   Received notification from CoverMyMeds that prior authorization for FreeStyle Libre 3 Sensor is required/requested.   Insurance verification completed.   The patient is insured through [facility] .   Per test claim: PA required; PA submitted to above mentioned insurance via CoverMyMeds Key/confirmation #/EOC (Key: [id number])   Status is pending

## 2024-04-21 ENCOUNTER — Other Ambulatory Visit (HOSPITAL_COMMUNITY): Payer: Self-pay

## 2024-04-21 ENCOUNTER — Ambulatory Visit: Payer: Self-pay | Admitting: Medical

## 2024-04-21 NOTE — Progress Notes (Signed)
 Results sent through MyChart

## 2024-04-21 NOTE — Telephone Encounter (Signed)
 Pharmacy Patient Advocate Encounter  Received notification from [employer] that Prior Authorization for FreeStyle Libre 3 Sensor has been APPROVED from 5.28.25 to 5.28.26. Ran test claim, Copay is $39.99. This test claim was processed through [facility]- copay amounts may vary at other pharmacies due to pharmacy/plan contracts, or as the patient moves through the different stages of their insurance plan.   PA #/Case ID/Reference #: (Key: [id number])

## 2024-05-09 ENCOUNTER — Other Ambulatory Visit: Payer: Self-pay | Admitting: Medical

## 2024-05-09 NOTE — Telephone Encounter (Signed)
Dose increased to 2mg.

## 2024-06-01 ENCOUNTER — Other Ambulatory Visit: Payer: Self-pay | Admitting: Medical

## 2024-06-06 ENCOUNTER — Other Ambulatory Visit: Payer: Self-pay | Admitting: Medical

## 2024-06-13 ENCOUNTER — Encounter: Payer: Self-pay | Admitting: Medical

## 2024-06-13 ENCOUNTER — Ambulatory Visit: Admitting: Medical

## 2024-06-13 DIAGNOSIS — Z0000 Encounter for general adult medical examination without abnormal findings: Secondary | ICD-10-CM | POA: Diagnosis not present

## 2024-06-13 DIAGNOSIS — I25119 Atherosclerotic heart disease of native coronary artery with unspecified angina pectoris: Secondary | ICD-10-CM | POA: Diagnosis not present

## 2024-06-13 DIAGNOSIS — E118 Type 2 diabetes mellitus with unspecified complications: Secondary | ICD-10-CM

## 2024-06-13 DIAGNOSIS — Z951 Presence of aortocoronary bypass graft: Secondary | ICD-10-CM

## 2024-06-13 DIAGNOSIS — E785 Hyperlipidemia, unspecified: Secondary | ICD-10-CM

## 2024-06-13 DIAGNOSIS — K760 Fatty (change of) liver, not elsewhere classified: Secondary | ICD-10-CM

## 2024-06-13 DIAGNOSIS — Z125 Encounter for screening for malignant neoplasm of prostate: Secondary | ICD-10-CM

## 2024-06-13 DIAGNOSIS — I10 Essential (primary) hypertension: Secondary | ICD-10-CM

## 2024-06-13 DIAGNOSIS — K529 Noninfective gastroenteritis and colitis, unspecified: Secondary | ICD-10-CM

## 2024-06-13 MED ORDER — RESTORA RX 60-1.25 MG PO CAPS: 1.0000 | ORAL_CAPSULE | Freq: Every day | ORAL | 1 refills | Status: DC

## 2024-06-13 NOTE — Progress Notes (Signed)
 Subjective:   HPI  Hunter Gomez is a 53 y.o. male who presents for Chief Complaint  Patient presents with   Annual Exam    Fasting cpe, no concerns, declines pneumonia and shingles shot today    Patient Care Team: Vinisha Faxon, Alm RAMAN, PA-C as PCP - General (Family Medicine) Wonda Sharper, MD as PCP - Cardiology (Cardiology) Wonda Sharper, MD as Consulting Physician (Cardiology) Lelon Glendia ONEIDA DEVONNA as Physician Assistant (Cardiology)   Concerns: Here for physical.   Good about taking his medications  He notes ongoing problems with chronic diarrhea for a while now.   Even after prior changes with statins, and avoiding metformin  since last visit, no improvement.  Stools have been somewhat better with ozempic .  Reviewed their medical, surgical, family, social, medication, and allergy history and updated chart as appropriate.  Allergies  Allergen Reactions   Atorvastatin  Diarrhea    Rosuvastatin  also     Past Medical History:  Diagnosis Date   CAD (coronary artery disease)    a.  NSTEMI (1/16):  LHC - dLM 95, oLAD 90, mLAD 30, oCFX 80-90, pCFX 50, oPDA 80, oPLA 75, EF 55-65% >> urgent CABG// Myoview  03/2020: EF 54, no ischemia or infarction; low risk   Hepatic steatosis    HLD (hyperlipidemia)    Hx of CABG 2016   Hx of echocardiogram    Echo (2/16):  Mild LVH, EF 55-60%, Gr 2 DD, trivial AI/TR, no effusion   Hypertension 10/2012   Muscle weakness 1997   prior seizure like activity, but none since; prior neurology eval, prior normal EEG 1997   Obesity    s/p Lap Band surgery   Vision problem    prior use of glasses, not currently    Current Outpatient Medications:    clopidogrel  (PLAVIX ) 75 MG tablet, TAKE 1 TABLET BY MOUTH DAILY, Disp: 90 tablet, Rfl: 3   Evolocumab  (REPATHA  SURECLICK) 140 MG/ML SOAJ, Inject 140 mg into the skin every 14 (fourteen) days., Disp: 6 mL, Rfl: 3   isosorbide  mononitrate (IMDUR ) 30 MG 24 hr tablet, Take 0.5 tablets (15 mg total) by  mouth daily., Disp: 45 tablet, Rfl: 3   Lactobacillus Casei-Folic Acid (RESTORA RX) 60-1.25 MG CAPS, Take 1 capsule by mouth daily., Disp: 90 capsule, Rfl: 1   lisinopril  (ZESTRIL ) 20 MG tablet, Take 1 tablet (20 mg total) by mouth daily., Disp: 90 tablet, Rfl: 3   metoprolol  tartrate (LOPRESSOR ) 50 MG tablet, TAKE ONE TABLET BY MOUTH TWICE A DAY WITH A MEAL, Disp: 180 tablet, Rfl: 2   Semaglutide , 1 MG/DOSE, (OZEMPIC , 1 MG/DOSE,) 4 MG/3ML SOPN, DIAL AND INJECT UNDER THE SKIN 1 MG WEEKLY, Disp: 3 mL, Rfl: 0   tamsulosin  (FLOMAX ) 0.4 MG CAPS capsule, TAKE 1 CAPSULE BY MOUTH DAILY  AFTER BREAKFAST, Disp: 90 capsule, Rfl: 0   Continuous Glucose Sensor (FREESTYLE LIBRE 3 SENSOR) MISC, 1 each by Does not apply route every 14 (fourteen) days. Place 1 sensor on the skin every 14 days. Use to check glucose continuously (Patient not taking: Reported on 06/13/2024), Disp: 2 each, Rfl: 5   hydrochlorothiazide  (HYDRODIURIL ) 25 MG tablet, Take 1 tablet (25 mg total) by mouth daily. (Patient not taking: Reported on 06/13/2024), Disp: 90 tablet, Rfl: 3   metFORMIN  (GLUCOPHAGE ) 500 MG tablet, TAKE 1 TABLET BY MOUTH DAILY  WITH BREAKFAST (Patient not taking: Reported on 06/13/2024), Disp: 90 tablet, Rfl: 0   nitroGLYCERIN  (NITROSTAT ) 0.4 MG SL tablet, Place 1 tablet (0.4 mg total) under the tongue  every 5 (five) minutes as needed for chest pain., Disp: 30 tablet, Rfl: 6  Family History  Problem Relation Age of Onset   Cancer Mother        lung   Diabetes Mother    Hypertension Mother    Hyperlipidemia Mother    Heart disease Mother 37       CABG   Peripheral vascular disease Mother    Hypertension Father    Hyperlipidemia Father    Heart disease Father 15       CABG   Stroke Father    Hypertension Brother    Hyperlipidemia Brother    Cancer Maternal Grandfather        stomach   Cancer Paternal Grandmother    Heart attack Neg Hx     Past Surgical History:  Procedure Laterality Date   CARDIAC  CATHETERIZATION     CHOLECYSTECTOMY N/A 12/31/2016   Procedure: LAPAROSCOPIC CHOLECYSTECTOMY;  Surgeon: Debby Shipper, MD;  Location: [facility] OR;  Service: General;  Laterality: N/A;   CORONARY ARTERY BYPASS GRAFT N/A 12/18/2014   Procedure: CORONARY ARTERY BYPASS GRAFTING (CABG), ON PUMP, TIMES FIVE, USING BILATERAL INTERNAL MAMMARY ARTERIES, RIGHT GREATER SAPHENOUS VEIN HARVESTED ENDOSCOPICALLY;  Surgeon: Maude Fleeta Ochoa, MD;  Location: [facility] OR;  Service: Open Heart Surgery;  Laterality: N/A;  LIMA-LAD, SVG-D, SVG-OM, Free RIMA-Ramus, SVG-PD   CORONARY STENT INTERVENTION N/A 02/13/2021   Procedure: CORONARY STENT INTERVENTION;  Surgeon: Wonda Sharper, MD;  Location: [facility] INVASIVE CV LAB;  Service: Cardiovascular;  Laterality: N/A;   ENDOSCOPIC RETROGRADE CHOLANGIOPANCREATOGRAPHY (ERCP) WITH PROPOFOL  N/A 12/29/2016   Procedure: ENDOSCOPIC RETROGRADE CHOLANGIOPANCREATOGRAPHY (ERCP) WITH PROPOFOL ;  Surgeon: Toribio SHAUNNA Cedar, MD;  Location: [facility] ENDOSCOPY;  Service: Endoscopy;  Laterality: N/A;   INTRAOPERATIVE TRANSESOPHAGEAL ECHOCARDIOGRAM N/A 12/18/2014   Procedure: INTRAOPERATIVE TRANSESOPHAGEAL ECHOCARDIOGRAM;  Surgeon: Maude Fleeta Ochoa, MD;  Location: [facility] OR;  Service: Open Heart Surgery;  Laterality: N/A;   LAPAROSCOPIC GASTRIC BANDING  2007   initial procedure Dr. Celinda in [place], adjustments in [place], [place]   LAPAROSCOPIC GASTRIC BANDING  2006   LEFT HEART CATH AND CORS/GRAFTS ANGIOGRAPHY N/A 02/13/2021   Procedure: LEFT HEART CATH AND CORS/GRAFTS ANGIOGRAPHY;  Surgeon: Wonda Sharper, MD;  Location: [facility] INVASIVE CV LAB;  Service: Cardiovascular;  Laterality: N/A;   LEFT HEART CATHETERIZATION WITH CORONARY ANGIOGRAM N/A 12/18/2014   Procedure: LEFT HEART CATHETERIZATION WITH CORONARY ANGIOGRAM;  Surgeon: Sharper JONETTA Wonda, MD;  Location: [facility] CATH LAB;  Service: Cardiovascular;  Laterality: N/A;   TONSILLECTOMY     WISDOM TOOTH EXTRACTION      Review of Systems  Constitutional:  Negative for chills,  fever, malaise/fatigue and weight loss.  HENT:  Negative for congestion, ear pain, hearing loss, sore throat and tinnitus.   Eyes:  Negative for blurred vision, pain and redness.  Respiratory:  Negative for cough, hemoptysis and shortness of breath.   Cardiovascular:  Negative for chest pain, palpitations, orthopnea, claudication and leg swelling.  Gastrointestinal:  Positive for diarrhea. Negative for abdominal pain, blood in stool, constipation, nausea and vomiting.  Genitourinary:  Negative for dysuria, flank pain, frequency, hematuria and urgency.  Musculoskeletal:  Negative for falls, joint pain and myalgias.  Skin:  Negative for itching and rash.  Neurological:  Negative for dizziness, tingling, speech change, weakness and headaches.  Endo/Heme/Allergies:  Negative for polydipsia. Does not bruise/bleed easily.  Psychiatric/Behavioral:  Negative for depression and memory loss. The patient is not nervous/anxious and does not have insomnia.  Objective:  BP 122/80   Pulse 72   Ht 5' 9 (1.753 m)   Wt 230 lb (104.3 kg)   BMI 33.97 kg/m   General appearance: alert, no distress, WD/WN, Caucasian male Skin: Unremarkable HEENT: normocephalic, conjunctiva/corneas normal, sclerae anicteric, PERRLA, EOMi, nares patent, no discharge or erythema, pharynx normal Oral cavity: MMM, tongue normal, teeth in good repair Neck: supple, no lymphadenopathy, no thyromegaly, no masses, normal ROM, no bruits Chest: Anterior surgical scars, non tender, normal shape and expansion Heart: RRR, normal S1, S2, no murmurs Lungs: CTA bilaterally, no wheezes, rhonchi, or rales Abdomen: +bs, soft, upper abdomen port surgical scars, non tender, non distended, no masses, no hepatomegaly, no splenomegaly, no bruits Back: non tender, normal ROM, no scoliosis Musculoskeletal: upper extremities non tender, no obvious deformity, normal ROM throughout, lower extremities non tender, no obvious deformity, normal ROM  throughout Extremities: no edema, no cyanosis, no clubbing Pulses: 2+ symmetric, upper and lower extremities, normal cap refill Neurological: alert, oriented x 3, CN2-12 intact, strength normal upper extremities and lower extremities, sensation normal throughout, DTRs 2+ throughout, no cerebellar signs, gait normal Psychiatric: normal affect, behavior normal, pleasant  GU: normal male, circ, no mass, no lymphadenopathy Rectal: normal, prostate WNL    Assessment and Plan :   Encounter Diagnoses  Name Primary?   Encounter for health maintenance examination in adult    Dyslipidemia    Coronary artery disease involving native coronary artery of native heart with angina pectoris (HCC)    Screening for prostate cancer    S/P CABG x 5    Hepatic steatosis    Essential hypertension, benign    Chronic diarrhea    Type II diabetes mellitus with complication (HCC)     This visit was a preventative care visit, also known as wellness visit or routine physical.   Topics typically include healthy lifestyle, diet, exercise, preventative care, vaccinations, sick and well care, proper use of emergency dept and after hours care, as well as other concerns.     Separate significant issues discussed: Hypertension, hx/o NSTEMI, hx/o CABG - sees cardiology, compliant with medications, plavix , Metoprolol  50mg  BID, Lisinopril  20mg  daily, repatha  injection, continue imdur   CAD, hyperlipidemia - compliant with medication repatha   Diabetes - updated labs today, continue ozempic .    BPH - continue Flomax   Chronic diarrhea - refer to GI, begin trial of Restora probiotic  General Recommendations: Continue to return yearly for your annual wellness and preventative care visits.  This gives us  a chance to discuss healthy lifestyle, exercise, vaccinations, review your chart record, and perform screenings where appropriate.  I recommend you see your eye doctor yearly for routine vision care.  I recommend you  see your dentist yearly for routine dental care including hygiene visits twice yearly.   Vaccination  Immunization History  Administered Date(s) Administered   Influenza,inj,Quad PF,6+ Mos 09/09/2017   PFIZER(Purple Top)SARS-COV-2 Vaccination 02/02/2020, 02/23/2020, 10/25/2020   Pneumococcal Polysaccharide-23 09/09/2017   Tdap 10/25/2012, 05/05/2022    Vaccine recommendations: Shingrix, yearly flu shot, Prevnar Declines vaccines today   Screening for cancer: Colon cancer screening: Cologuard negative 2023  Prostate Cancer screening: The recommended prostate cancer screening test is a blood test called the prostate-specific antigen (PSA) test. PSA is a protein that is made in the prostate. As you age, your prostate naturally produces more PSA. Abnormally high PSA levels may be caused by: Prostate cancer. An enlarged prostate that is not caused by cancer (benign prostatic hyperplasia, or BPH). This condition is  very common in older men. A prostate gland infection (prostatitis) or urinary tract infection. Certain medicines such as male hormones (like testosterone) or other medicines that raise testosterone levels. A rectal exam may be done as part of prostate cancer screening to help provide information about the size of your prostate gland. When a rectal exam is performed, it should be done after the PSA level is drawn to avoid any effect on the results.   Skin cancer screening: Check your skin regularly for new changes, growing lesions, or other lesions of concern Come in for evaluation if you have skin lesions of concern.   Lung cancer screening: If you have a greater than 20 pack year history of tobacco use, then you may qualify for lung cancer screening with a chest CT scan.   Please call your insurance company to inquire about coverage for this test.   Pancreatic cancer:  no current screening test is available or routinely recommended. (risk factors: smoking, overweight or  obese, diabetes, chronic pancreatitis, work exposure - dry cleaning, metal working, 53yo>, M>F, Tree surgeon, family hx/o, hereditary breast, ovarian, melanoma, lynch, peutz-jeghers).  Symptoms: jaundice, dark urine, light color or greasy stools, itchy skin, belly or back pain, weight loss, poor appetite, nausea, vomiting, liver enlargement, DVT/blood clots.   We currently don't have screenings for other cancers besides breast, cervical, colon, and lung cancers.  If you have a strong family history of cancer or have other cancer screening concerns, please let me know.  Genetic testing referral is an option for individuals with high cancer risk in the family.  There are some other cancer screenings in development currently.   Bone health: Get at least 150 minutes of aerobic exercise weekly Get weight bearing exercise at least once weekly Bone density test:  A bone density test is an imaging test that uses a type of X-ray to measure the amount of calcium  and other minerals in your bones. The test may be used to diagnose or screen you for a condition that causes weak or thin bones (osteoporosis), predict your risk for a broken bone (fracture), or determine how well your osteoporosis treatment is working. The bone density test is recommended for females 65 and older, or females or males <65 if certain risk factors such as thyroid disease, long term use of steroids such as for asthma or rheumatological issues, vitamin D deficiency, estrogen deficiency, family history of osteoporosis, self or family history of fragility fracture in first degree relative.    Heart health: Get at least 150 minutes of aerobic exercise weekly Limit alcohol It is important to maintain a healthy blood pressure and healthy cholesterol numbers  Heart disease screening: Screening for heart disease includes screening for blood pressure, fasting lipids, glucose/diabetes screening, BMI height to weight ratio, reviewed of  smoking status, physical activity, and diet.    Goals include blood pressure 120/80 or less, maintaining a healthy lipid/cholesterol profile, preventing diabetes or keeping diabetes numbers under good control, not smoking or using tobacco products, exercising most days per week or at least 150 minutes per week of exercise, and eating healthy variety of fruits and vegetables, healthy oils, and avoiding unhealthy food choices like fried food, fast food, high sugar and high cholesterol foods.      Vascular disease screening: For higher risk individuals including smokers, diabetics, patients with known heart disease or high blood pressure, kidney disease, and others, screening for vascular disease or atherosclerosis of the arteries is available.  Examples may include carotid  ultrasound, abdominal aortic ultrasound, ABI blood flow screening in the legs, thoracic aorta screening.   Medical care options: I recommend you continue to seek care here first for routine care.  We try really hard to have available appointments Monday through Friday daytime hours for sick visits, acute visits, and physicals.  Urgent care should be used for after hours and weekends for significant issues that cannot wait till the next day.  The emergency department should be used for significant potentially life-threatening emergencies.  The emergency department is expensive, can often have long wait times for less significant concerns, so try to utilize primary care, urgent care, or telemedicine when possible to avoid unnecessary trips to the emergency department.  Virtual visits and telemedicine have been introduced since the pandemic started in 2020, and can be convenient ways to receive medical care.  We offer virtual appointments as well to assist you in a variety of options to seek medical care.   Legal  Take the time to do a last will and testament, Advanced Directives including Health Care Power of Attorney and Living Will  documents.  Don't leave your family with burdens that can be handled ahead of time.   Advanced Directives: I recommend you consider completing a Health Care Power of Attorney and Living Will.   These documents respect your wishes and help alleviate burdens on your loved ones if you were to become terminally ill or be in a position to need those documents enforced.    You can complete Advanced Directives yourself, have them notarized, then have copies made for our office, for you and for anybody you feel should have them in safe keeping.  Or, you can have an attorney prepare these documents.   If you haven't updated your Last Will and Testament in a while, it may be worthwhile having an attorney prepare these documents together and save on some costs.       Spiritual and Emotional Health Keeping a healthy spiritual life can help you better manage your physical health. Your spiritual life can help you to cope with any issues that may arise with your physical health.  Balance can keep us  healthy and help us  to recover.  If you are struggling with your spiritual health there are questions that you may want to ask yourself:  What makes me feel most complete? When do I feel most connected to the rest of the world? Where do I find the most inner strength? What am I doing when I feel whole?  Helpful tips: Being in nature. Some people feel very connected and at peace when they are walking outdoors or are outside. Helping others. Some feel the largest sense of wellbeing when they are of service to others. Being of service can take on many forms. It can be doing volunteer work, being kind to strangers, or offering a hand to a friend in need. Gratitude. Some people find they feel the most connected when they remain grateful. They may make lists of all the things they are grateful for or say a thank you out loud for all they have.    Emotional Health Are you in tune with your emotional health?  Check  out this link: http://www.marquez-love.com/    Financial Health Make sure you use a budget for your personal finances Make sure you are insured against risks (health insurance, life insurance, auto insurance, etc) Save more, spend less Set financial goals If you need help in this area, good resources include counseling  through [employer] or other community resources, have a meeting with a Social research officer, government, and a good resource is the [employer]    E[place] was seen today for annual exam.  Diagnoses and all orders for this visit:  Encounter for health maintenance examination in adult -     Comprehensive metabolic panel with GFR -     CBC with Differential/Platelet -     Lipid panel -     PSA -     TSH + free T4 -     Hemoglobin A1c -     Microalbumin/Creatinine Ratio, Urine -     Urinalysis, Routine w reflex microscopic  Dyslipidemia -     Lipid panel  Coronary artery disease involving native coronary artery of native heart with angina pectoris (HCC) -     Lipid panel  Screening for prostate cancer -     PSA  S/P CABG x 5  Hepatic steatosis  Essential hypertension, benign  Chronic diarrhea -     TSH + free T4 -     Urinalysis, Routine w reflex microscopic  Type II diabetes mellitus with complication (HCC) -     Hemoglobin A1c -     Microalbumin/Creatinine Ratio, Urine  Other orders -     Lactobacillus Casei-Folic Acid (RESTORA RX) 60-1.25 MG CAPS; Take 1 capsule by mouth daily.   Follow-up pending labs, yearly for physical

## 2024-06-14 ENCOUNTER — Ambulatory Visit: Payer: Self-pay | Admitting: Medical

## 2024-06-14 ENCOUNTER — Other Ambulatory Visit: Payer: Self-pay | Admitting: Medical

## 2024-06-14 LAB — CBC WITH DIFFERENTIAL/PLATELET
Basophils Absolute: 0.1 x10E3/uL (ref 0.0–0.2)
Basos: 1 %
EOS (ABSOLUTE): 0.3 x10E3/uL (ref 0.0–0.4)
Eos: 4 %
Hematocrit: 45.1 % (ref 37.5–51.0)
Hemoglobin: 14.8 g/dL (ref 13.0–17.7)
Immature Grans (Abs): 0 x10E3/uL (ref 0.0–0.1)
Immature Granulocytes: 0 %
Lymphocytes Absolute: 3.7 x10E3/uL — ABNORMAL HIGH (ref 0.7–3.1)
Lymphs: 42 %
MCH: 28.8 pg (ref 26.6–33.0)
MCHC: 32.8 g/dL (ref 31.5–35.7)
MCV: 88 fL (ref 79–97)
Monocytes Absolute: 0.7 x10E3/uL (ref 0.1–0.9)
Monocytes: 7 %
Neutrophils Absolute: 4.1 x10E3/uL (ref 1.4–7.0)
Neutrophils: 46 %
Platelets: 353 x10E3/uL (ref 150–450)
RBC: 5.14 x10E6/uL (ref 4.14–5.80)
RDW: 13.1 % (ref 11.6–15.4)
WBC: 8.9 x10E3/uL (ref 3.4–10.8)

## 2024-06-14 LAB — URINALYSIS, ROUTINE W REFLEX MICROSCOPIC
Bilirubin, UA: NEGATIVE
Glucose, UA: NEGATIVE
Ketones, UA: NEGATIVE
Leukocytes,UA: NEGATIVE
Nitrite, UA: NEGATIVE
Protein,UA: NEGATIVE
RBC, UA: NEGATIVE
Specific Gravity, UA: 1.024 (ref 1.005–1.030)
Urobilinogen, Ur: 0.2 mg/dL (ref 0.2–1.0)
pH, UA: 5.5 (ref 5.0–7.5)

## 2024-06-14 LAB — COMPREHENSIVE METABOLIC PANEL WITH GFR
ALT: 26 IU/L (ref 0–44)
AST: 20 IU/L (ref 0–40)
Albumin: 4.5 g/dL (ref 3.8–4.9)
Alkaline Phosphatase: 93 IU/L (ref 44–121)
BUN/Creatinine Ratio: 15 (ref 9–20)
BUN: 12 mg/dL (ref 6–24)
Bilirubin Total: 0.4 mg/dL (ref 0.0–1.2)
CO2: 19 mmol/L — ABNORMAL LOW (ref 20–29)
Calcium: 9.4 mg/dL (ref 8.7–10.2)
Chloride: 104 mmol/L (ref 96–106)
Creatinine, Ser: 0.8 mg/dL (ref 0.76–1.27)
Globulin, Total: 2.3 g/dL (ref 1.5–4.5)
Glucose: 106 mg/dL — ABNORMAL HIGH (ref 70–99)
Potassium: 4.2 mmol/L (ref 3.5–5.2)
Sodium: 142 mmol/L (ref 134–144)
Total Protein: 6.8 g/dL (ref 6.0–8.5)
eGFR: 106 mL/min/1.73 (ref >59)

## 2024-06-14 LAB — LIPID PANEL
Chol/HDL Ratio: 4 ratio (ref 0.0–5.0)
Cholesterol, Total: 140 mg/dL (ref 100–199)
HDL: 35 mg/dL — ABNORMAL LOW (ref >39)
LDL Chol Calc (NIH): 73 mg/dL (ref 0–99)
Triglycerides: 186 mg/dL — ABNORMAL HIGH (ref 0–149)
VLDL Cholesterol Cal: 32 mg/dL (ref 5–40)

## 2024-06-14 LAB — TSH+FREE T4
Free T4: 1.03 ng/dL (ref 0.82–1.77)
TSH: 2.03 u[IU]/mL (ref 0.450–4.500)

## 2024-06-14 LAB — PSA: Prostate Specific Ag, Serum: 2.9 ng/mL (ref 0.0–4.0)

## 2024-06-14 LAB — MICROALBUMIN / CREATININE URINE RATIO
Creatinine, Urine: 186.9 mg/dL
Microalb/Creat Ratio: 6 mg/g{creat} (ref 0–29)
Microalbumin, Urine: 11.1 ug/mL

## 2024-06-14 LAB — HEMOGLOBIN A1C
Est. average glucose Bld gHb Est-mCnc: 111 mg/dL
Hgb A1c MFr Bld: 5.5 % (ref 4.8–5.6)

## 2024-06-14 MED ORDER — TAMSULOSIN HCL 0.4 MG PO CAPS: ORAL_CAPSULE | ORAL | 2 refills | Status: DC

## 2024-06-14 NOTE — Progress Notes (Signed)
 Results sent through MyChart

## 2024-07-02 ENCOUNTER — Other Ambulatory Visit: Payer: Self-pay | Admitting: Medical

## 2024-07-04 ENCOUNTER — Other Ambulatory Visit: Payer: Self-pay | Admitting: Medical

## 2024-07-11 ENCOUNTER — Other Ambulatory Visit: Payer: Self-pay | Admitting: Cardiovascular Disease

## 2024-08-29 ENCOUNTER — Other Ambulatory Visit: Payer: Self-pay | Admitting: Cardiovascular Disease

## 2024-08-29 DIAGNOSIS — I25119 Atherosclerotic heart disease of native coronary artery with unspecified angina pectoris: Secondary | ICD-10-CM

## 2024-08-29 DIAGNOSIS — E782 Mixed hyperlipidemia: Secondary | ICD-10-CM

## 2024-08-30 ENCOUNTER — Encounter: Payer: Self-pay | Admitting: Medical

## 2024-09-01 ENCOUNTER — Other Ambulatory Visit: Payer: Self-pay | Admitting: Medical

## 2024-09-01 ENCOUNTER — Other Ambulatory Visit: Payer: Self-pay | Admitting: Cardiovascular Disease

## 2024-09-01 MED ORDER — OZEMPIC (2 MG/DOSE) 8 MG/3ML ~~LOC~~ SOPN: 2.0000 mg | PEN_INJECTOR | SUBCUTANEOUS | 0 refills | Status: DC

## 2024-09-01 NOTE — Telephone Encounter (Signed)
 Does patient stay at this dose or increase to next dose

## 2024-09-01 NOTE — Telephone Encounter (Signed)
 Pt would like to keep increasing to the higher doses. Please send in next dose

## 2024-09-02 MED ORDER — CLOPIDOGREL BISULFATE 75 MG PO TABS: 75.0000 mg | ORAL_TABLET | Freq: Every day | ORAL | 0 refills | Status: DC

## 2024-09-02 MED ORDER — LISINOPRIL 20 MG PO TABS: 20.0000 mg | ORAL_TABLET | Freq: Every day | ORAL | 0 refills | Status: DC

## 2024-09-02 MED ORDER — ISOSORBIDE MONONITRATE ER 30 MG PO TB24: 15.0000 mg | ORAL_TABLET | Freq: Every day | ORAL | 0 refills | Status: DC

## 2024-09-02 MED ORDER — METOPROLOL TARTRATE 50 MG PO TABS: 50.0000 mg | ORAL_TABLET | Freq: Two times a day (BID) | ORAL | 0 refills | Status: DC

## 2024-09-02 NOTE — Addendum Note (Signed)
 Addended by: BLUFORD RAMP D on: 09/02/2024 09:15 AM   Modules accepted: Orders

## 2024-10-07 ENCOUNTER — Other Ambulatory Visit: Payer: Self-pay | Admitting: Cardiovascular Disease

## 2024-10-07 DIAGNOSIS — I25119 Atherosclerotic heart disease of native coronary artery with unspecified angina pectoris: Secondary | ICD-10-CM

## 2024-10-07 DIAGNOSIS — E782 Mixed hyperlipidemia: Secondary | ICD-10-CM

## 2024-10-27 ENCOUNTER — Encounter: Payer: Self-pay | Admitting: Cardiovascular Disease

## 2024-10-27 MED ORDER — ISOSORBIDE MONONITRATE ER 30 MG PO TB24: 15.0000 mg | ORAL_TABLET | Freq: Every day | ORAL | 0 refills | Status: AC

## 2024-10-27 MED ORDER — LISINOPRIL 20 MG PO TABS: 20.0000 mg | ORAL_TABLET | Freq: Every day | ORAL | 0 refills | Status: AC

## 2024-10-27 MED ORDER — CLOPIDOGREL BISULFATE 75 MG PO TABS: 75.0000 mg | ORAL_TABLET | Freq: Every day | ORAL | 0 refills | Status: AC

## 2024-10-27 MED ORDER — METOPROLOL TARTRATE 50 MG PO TABS: 50.0000 mg | ORAL_TABLET | Freq: Two times a day (BID) | ORAL | 0 refills | Status: AC

## 2024-10-27 NOTE — Addendum Note (Signed)
 Addended by: JOSHUA ANDREZ PARAS on: 10/27/2024 04:34 PM   Modules accepted: Orders

## 2024-11-02 ENCOUNTER — Other Ambulatory Visit: Payer: Self-pay | Admitting: Cardiovascular Disease

## 2024-11-02 DIAGNOSIS — I25119 Atherosclerotic heart disease of native coronary artery with unspecified angina pectoris: Secondary | ICD-10-CM

## 2024-11-02 DIAGNOSIS — E782 Mixed hyperlipidemia: Secondary | ICD-10-CM

## 2024-12-11 ENCOUNTER — Other Ambulatory Visit: Payer: Self-pay | Admitting: Cardiovascular Disease

## 2024-12-13 ENCOUNTER — Other Ambulatory Visit (HOSPITAL_COMMUNITY): Payer: Self-pay

## 2024-12-13 ENCOUNTER — Telehealth: Payer: Self-pay | Admitting: Pharmacy Technician

## 2024-12-13 NOTE — Telephone Encounter (Signed)
 Pharmacy Patient Advocate Encounter   Received notification from [employer] KEY that prior authorization for Ozempic  (0.25 or 0.5 MG/DOSE) 2MG /3ML pen-injectors is due for renewal.   Insurance verification completed.   The patient is insured through [facility].  Action: PA required; PA started via CoverMyMeds. KEY [id number] . Waiting for clinical questions to populate.

## 2024-12-14 ENCOUNTER — Other Ambulatory Visit (HOSPITAL_COMMUNITY): Payer: Self-pay

## 2024-12-14 NOTE — Telephone Encounter (Signed)
 Pharmacy Patient Advocate Encounter  Received notification from [employer] that Prior Authorization for Ozempic  (0.25 or 0.5 MG/DOSE) 2MG /3ML pen-injectors has been APPROVED from 12/14/24 to 11/2025. Unable to obtain price due to refill too soon rejection, last fill date 12/01/24 next available fill date01/29/26   PA #/Case ID/Reference #: [id number]

## 2024-12-19 ENCOUNTER — Encounter: Payer: Self-pay | Admitting: *Deleted

## 2024-12-20 NOTE — Progress Notes (Unsigned)
 " Cardiology Office Note:    Date:  12/21/2024   ID:  Hunter Gomez, DOB 1971-03-10, MRN [id number]  PCP:  Bulah Alm RAMAN, PA-C   [facility] Providers Cardiologist:  Ozell Fell, MD Cardiology APP:  Lelon Glendia ONEIDA DEVONNA     Referring MD: Bulah Alm RAMAN, PA-C   Chief Complaint  Patient presents with   Coronary Artery Disease    History of Present Illness:    Hunter Gomez is a 54 y.o. male with a hx of:  Coronary artery disease S/p NSTEMI in 2016 >> s/p CABG Myoview  5/21: low risk  S/p DES to [facility] 01/2021 Cath: LM 100, oLAD 80, RI 60, pLCx 90, dRCA 90, RPDA 50, RPAV 50; L-LAD ok, S-D1 ok; S-OM 100, S-RI 100, RIMA-RCA 100 Hx of gallstone pancreatitis s/p ERCP, cholecystectomy Hyperlipidemia Hypertension  Echocardiogram 2/16: EF 55-60, Gr 2 DD  He is here alone today. He's doing well from a cardiac perspective, but he's had lots of problems with diarrhea. He was referred to a gastroenterologist but hasn't had the consult yet. He has taken chewable peptobismol which has helped some, but he has now stopped that due to some side effects. Today, he denies symptoms of palpitations, shortness of breath, orthopnea, PND, lower extremity edema, dizziness, or syncope. He has occasional angina, but reports stable symptoms. He has a difficult time describing it as sometimes he has an 'irregular feeling' in the chest. He had it last year when he hiked up hill. He hasn't had any recent problems.   Current Medications: Active Medications[1]   Allergies:   Atorvastatin    ROS:   Please see the history of present illness.    All other systems reviewed and are negative.  EKGs/Labs/Other Studies Reviewed:    The following studies were reviewed today: Cardiac Studies & Procedures   ______________________________________________________________________________________________ CARDIAC CATHETERIZATION  CARDIAC CATHETERIZATION 02/13/2021  Conclusion 1.  Severe native vessel  coronary artery disease with total occlusion of the left main and severe stenosis of the distal RCA 2.  Status post aortocoronary bypass surgery with continued patency of the LIMA to LAD and saphenous vein graft to diagonal.  The saphenous vein graft to diagonal supplies the ramus intermedius and native left circumflex in retrograde fashion.  Those vessels both have significant obstructive disease that is unapproachable because of left main total occlusion 3.  Total occlusion of the saphenous vein graft OM, saphenous vein graft to ramus intermedius, and free RIMA to RCA 4.  Successful PCI of the distal RCA, reducing 90% stenosis to 0% with a 2.5 x 22 mm resolute Onyx DES  Recommendations: Same-day PCI protocol if criteria met, ongoing aggressive medical therapy, dual antiplatelet therapy with aspirin  and clopidogrel  at least 6 months (favor long-term if tolerated in this young patient with bypass graft failure and extensive CAD)  Findings Coronary Findings Diagnostic  Dominance: Right  Left Main Mid LM to Dist LM lesion is 100% stenosed.  Left Anterior Descending Ost LAD to Prox LAD lesion is 80% stenosed. The lesion is calcified.  Ramus Intermedius This vessel fills retrograde from the saphenous vein graft to diagonal Ramus lesion is 60% stenosed. The lesion is calcified.  Left Circumflex Vessel fills from retrograde bypass graft flow supplied by the first diagonal vein graft. Prox Cx lesion is 90% stenosed. The lesion is calcified.  Right Coronary Artery Dist RCA lesion is 90% stenosed.  Right Posterior Descending Artery RPDA lesion is 50% stenosed.  Right Posterior Atrioventricular Artery RPAV lesion is  50% stenosed.  LIMA LIMA Graft To Mid LAD LIMA graft was visualized by angiography and is large. The graft exhibits no disease.  Saphenous Graft To 1st Diag SVG graft was visualized by angiography and is normal in caliber. Large graft, widely patent, fills much of LCA  distribution including ramus and circumflex branches. no competitive filling seen in those vessels from other graft flow.  Intervention  Dist RCA lesion Stent CATH LAUNCHER 6FR JR4 guide catheter was inserted. Lesion crossed with guidewire using a WIRE COUGAR XT STRL 190CM. Pre-stent angioplasty was performed using a BALLOON SAPPHIRE 2.0X15. A drug-eluting stent was successfully placed using a STENT RESOLUTE ONYX 2.5X22. Post-stent angioplasty was performed using a BALLOON SAPPHIRE [place] [id number]. Post-Intervention Lesion Assessment The intervention was successful. Pre-interventional TIMI flow is 3. Post-intervention TIMI flow is 3. No complications occurred at this lesion. There is a 0% residual stenosis post intervention.   STRESS TESTS  MYOCARDIAL PERFUSION IMAGING 04/19/2020  Interpretation Summary  Nuclear stress EF: 54%.  The left ventricular ejection fraction is mildly decreased (45-54%).  There was no ST segment deviation noted during stress.  The study is normal.  This is a low risk study.  Normal stress nuclear study with no ischemia or infarction.  Gated ejection fraction 54% with normal wall motion.        CT SCANS  CT CORONARY MORPH W/CTA COR W/SCORE 12/16/2014  Addendum 12/16/2014  1:54 PM ADDENDUM REPORT: 12/16/2014 13:51  CLINICAL DATA:  Chest pain  EXAM: Cardiac CTA  MEDICATIONS: Sub lingual nitro. 4mg  and lopressor  25mg   TECHNIQUE: The patient was scanned on a Philips 256 slice scanner. Gantry rotation speed was 270 msecs. Collimation was .9mm. A 100 kV prospective scan was triggered in the descending thoracic aorta at 111 HU's with 5% padding centered around 78% of the R-R interval. Average HR during the scan was 62 bpm. The 3D data set was interpreted on a dedicated work station using MPR, MIP and VRT modes. A total of 80cc of contrast was used.  FINDINGS: Non-cardiac: See separate report from [facility]. Distal esophageal thickening?  From previous lap-band Gallstones with no inflammation  Calcium  Score:  571 all 3 vessels  Coronary Arteries: Right dominant with no anomalies  LM:  Mild soft plaque  LAD: Ostial LAD and proximal LAD with mixed plaque cannot r/o greater than 70% stenosis mid and distal LAD less than 40% calcific disease  D1:  Less than 40% calcific disease  Circumflex:  50% or greater mixed plaque in proximal vessel  OM1:  Less than 40% calcific disease  RCA: Dominant Less than 40% calcific disease in proximal mid and distal vessel  IMPRESSION:  1) Age advanced coronary calcification in all 3 major epicardial vessels 571 100th percentile for age and sex matched controls  2) Possible greater than 70% mixed plaque in proximal LAD and moderate disease in proximal circumflex  3) Distal esophageal thickening  4) Gallstones with no inflammation  Patient will be referred for cath if LAD lesion appears borderline by angio would consider FFR  Maude Emmer   Electronically Signed By: Maude Emmer M.D. On: 12/16/2014 13:51  Narrative EXAM: OVER-READ INTERPRETATION  CT CHEST  The following report is an over-read performed by radiologist Dr. Jackquline Skates [facility] on 12/16/2014. This over-read does not include interpretation of cardiac or coronary anatomy or pathology. The coronary CTA interpretation by the cardiologist is attached.  COMPARISON:  Chest radiograph 12/15/2014  FINDINGS: Limited view of the lungs demonstrates  mild ground-glass opacities at the lung bases most consistent with mild atelectasis. No pleural fluid.  There is diffuse thickening of the esophagus from the carina to the GE junction. Gastric banding device is present at the gastric cardia. Multiple gallstones are noted within the gallbladder without inflammation. No aggressive osseous lesion.  IMPRESSION: 1. Thickened distal esophagus suggests esophagitis which may relate to banding  device. 2. Cholelithiasis without cholecystitis.  Electronically Signed: By: Jackquline Boxer M.D. On: 12/16/2014 11:28     ______________________________________________________________________________________________      EKG:   EKG Interpretation Date/Time:  Wednesday December 21 2024 09:34:17 EST Ventricular Rate:  64 PR Interval:  194 QRS Duration:  96 QT Interval:  382 QTC Calculation: 394 R Axis:   56  Text Interpretation: Normal sinus rhythm Possible Inferior infarct (cited on or before 13-Feb-2021) When compared with ECG of 24-Aug-2023 10:07, No significant change was found Confirmed by Wonda Sharper [phone]) on 12/21/2024 9:47:37 AM    Recent Labs: 06/13/2024: ALT 26; BUN 12; Creatinine, Ser 0.80; Hemoglobin 14.8; Platelets 353; Potassium 4.2; Sodium 142; TSH 2.030  Recent Lipid Panel    Component Value Date/Time   CHOL 140 06/13/2024 1006   TRIG 186 (H) 06/13/2024 1006   HDL 35 (L) 06/13/2024 1006   CHOLHDL 4.0 06/13/2024 1006   CHOLHDL 3.0 09/09/2017 1104   VLDL 31 12/27/2016 0912   LDLCALC 73 06/13/2024 1006   LDLCALC 59 09/09/2017 1104          Physical Exam:    VS:  BP (!) 134/90 (BP Location: Right Arm, Patient Position: Sitting, Cuff Size: Large)   Pulse 64   Ht 5' 9 (1.753 m)   Wt 226 lb 9.6 oz (102.8 kg)   SpO2 99%   BMI 33.46 kg/m     Wt Readings from Last 3 Encounters:  12/21/24 226 lb 9.6 oz (102.8 kg)  06/13/24 230 lb (104.3 kg)  04/19/24 231 lb 6.4 oz (105 kg)     GEN:  Well nourished, well developed in no acute distress HEENT: Normal NECK: No JVD; No carotid bruits LYMPHATICS: No lymphadenopathy CARDIAC: RRR, no murmurs, rubs, gallops RESPIRATORY:  Clear to auscultation without rales, wheezing or rhonchi  ABDOMEN: Soft, non-tender, non-distended MUSCULOSKELETAL:  No edema; No deformity  SKIN: Warm and dry NEUROLOGIC:  Alert and oriented x 3 PSYCHIATRIC:  Normal affect   Assessment & Plan Coronary artery disease involving  native coronary artery of native heart with angina pectoris Patient clinically stable with no recent progression or acceleration of angina.  Continue clopidogrel , isosorbide , and metoprolol  at current doses.  Follow-up 1 year.  We discussed red flag symptoms and he will seek immediate medical attention if he develops worsening or more frequent chest discomfort Essential hypertension We discussed blood pressure management today.  He was previously on hydrochlorothiazide  but it was discontinued.  Currently managed with isosorbide , lisinopril , and metoprolol .  Blood pressure readings throughout  2025 reviewed through the electronic health record and his blood pressure was well-controlled on all of those occasions.  I advised him to monitor periodically at home and contact us  if blood pressure readings are consistently greater than 130/85 mmHg. Mixed hyperlipidemia Treated with evolocumab .  Followed by his PCP.  Last LDL on file is 73.  We discussed the importance of increased aerobic activity and diet/weight loss measures.  Patient is statin intolerant.    Medication Adjustments/Labs and Tests Ordered: Current medicines are reviewed at length with the patient today.  Concerns regarding medicines are  outlined above.  Orders Placed This Encounter  Procedures   EKG 12-Lead   No orders of the defined types were placed in this encounter.   Patient Instructions  Medication Instructions:  No medication changes were made at this visit. Continue current regimen.   *If you need a refill on your cardiac medications before your next appointment, please call your pharmacy*  Lab Work: None ordered today. If you have labs (blood work) drawn today and your tests are completely normal, you will receive your results only by: MyChart Message (if you have MyChart) OR A paper copy in the mail If you have any lab test that is abnormal or we need to change your treatment, we will call you to review the  results.  Testing/Procedures: None ordered today.  Follow-Up: At [facility], you and your health needs are our priority.  As part of our continuing mission to provide you with exceptional heart care, our providers are all part of one team.  This team includes your primary Cardiologist (physician) and Advanced Practice Providers or APPs (Physician Assistants and Nurse Practitioners) who all work together to provide you with the care you need, when you need it.  Your next appointment:   1 year(s)  Provider:   Ozell Fell, MD     Signed, Ozell Fell, MD  12/21/2024 1:21 PM    [facility]     [1]  Current Meds  Medication Sig   clopidogrel  (PLAVIX ) 75 MG tablet Take 1 tablet (75 mg total) by mouth daily.   Evolocumab  (REPATHA  SURECLICK) 140 MG/ML SOAJ INJECT 1 PEN SUBCUTANEOUSLY  EVERY 2 WEEKS   isosorbide  mononitrate (IMDUR ) 30 MG 24 hr tablet Take 0.5 tablets (15 mg total) by mouth daily.   lisinopril  (ZESTRIL ) 20 MG tablet Take 1 tablet (20 mg total) by mouth daily.   metoprolol  tartrate (LOPRESSOR ) 50 MG tablet Take 1 tablet (50 mg total) by mouth 2 (two) times daily with a meal.   nitroGLYCERIN  (NITROSTAT ) 0.4 MG SL tablet Place 1 tablet (0.4 mg total) under the tongue every 5 (five) minutes as needed for chest pain.   Semaglutide , 2 MG/DOSE, (OZEMPIC , 2 MG/DOSE,) 8 MG/3ML SOPN Inject 2 mg into the skin once a week.   tamsulosin  (FLOMAX ) 0.4 MG CAPS capsule TAKE 1 CAPSULE BY MOUTH DAILY  AFTER BREAKFAST   "

## 2024-12-21 ENCOUNTER — Encounter: Payer: Self-pay | Admitting: Cardiovascular Disease

## 2024-12-21 ENCOUNTER — Ambulatory Visit: Attending: Cardiovascular Disease | Admitting: Cardiovascular Disease

## 2024-12-21 VITALS — BP 134/90 | HR 64 | Ht 69.0 in | Wt 226.6 lb

## 2024-12-21 DIAGNOSIS — E782 Mixed hyperlipidemia: Secondary | ICD-10-CM

## 2024-12-21 DIAGNOSIS — I25119 Atherosclerotic heart disease of native coronary artery with unspecified angina pectoris: Secondary | ICD-10-CM | POA: Diagnosis not present

## 2024-12-21 DIAGNOSIS — I10 Essential (primary) hypertension: Secondary | ICD-10-CM

## 2024-12-21 NOTE — Assessment & Plan Note (Addendum)
 Patient clinically stable with no recent progression or acceleration of angina.  Continue clopidogrel , isosorbide , and metoprolol  at current doses.  Follow-up 1 year.  We discussed red flag symptoms and he will seek immediate medical attention if he develops worsening or more frequent chest discomfort

## 2024-12-21 NOTE — Patient Instructions (Signed)

## 2024-12-28 ENCOUNTER — Other Ambulatory Visit: Payer: Self-pay | Admitting: Medical

## 2025-06-27 ENCOUNTER — Encounter: Payer: Self-pay | Admitting: Medical

## 2025-07-04 ENCOUNTER — Encounter: Admitting: Medical

## 2025-07-05 ENCOUNTER — Encounter: Admitting: Medical
